# Patient Record
Sex: Male | Born: 1975 | Race: Black or African American | Hispanic: No | Marital: Married | Smoking: Former smoker
Health system: Southern US, Community
[De-identification: ages and names within clinical notes are randomized; demographics above are authoritative.]

## PROBLEM LIST (undated history)

## (undated) DIAGNOSIS — G4730 Sleep apnea, unspecified: Secondary | ICD-10-CM

## (undated) DIAGNOSIS — I2510 Atherosclerotic heart disease of native coronary artery without angina pectoris: Secondary | ICD-10-CM

## (undated) DIAGNOSIS — E785 Hyperlipidemia, unspecified: Secondary | ICD-10-CM

## (undated) DIAGNOSIS — N189 Chronic kidney disease, unspecified: Secondary | ICD-10-CM

## (undated) DIAGNOSIS — I509 Heart failure, unspecified: Secondary | ICD-10-CM

## (undated) DIAGNOSIS — I10 Essential (primary) hypertension: Secondary | ICD-10-CM

## (undated) DIAGNOSIS — R011 Cardiac murmur, unspecified: Secondary | ICD-10-CM

## (undated) HISTORY — PX: EYE SURGERY: SHX253

## (undated) HISTORY — PX: CORONARY ARTERY BYPASS GRAFT: SHX141

## (undated) HISTORY — DX: Hyperlipidemia, unspecified: E78.5

## (undated) HISTORY — DX: Atherosclerotic heart disease of native coronary artery without angina pectoris: I25.10

## (undated) HISTORY — DX: Essential (primary) hypertension: I10

---

## 2000-10-04 ENCOUNTER — Emergency Department (HOSPITAL_COMMUNITY): Admission: EM | Admit: 2000-10-04 | Discharge: 2000-10-04 | Payer: Self-pay | Admitting: *Deleted

## 2008-04-14 HISTORY — PX: CORONARY ANGIOPLASTY: SHX604

## 2008-10-27 ENCOUNTER — Encounter (INDEPENDENT_AMBULATORY_CARE_PROVIDER_SITE_OTHER): Payer: Self-pay | Admitting: Internal Medicine

## 2008-10-27 ENCOUNTER — Observation Stay (HOSPITAL_COMMUNITY): Admission: EM | Admit: 2008-10-27 | Discharge: 2008-10-29 | Payer: Self-pay | Admitting: Emergency Medicine

## 2008-12-15 ENCOUNTER — Inpatient Hospital Stay (HOSPITAL_COMMUNITY): Admission: RE | Admit: 2008-12-15 | Discharge: 2008-12-23 | Payer: Self-pay | Admitting: Cardiology

## 2008-12-15 ENCOUNTER — Encounter (INDEPENDENT_AMBULATORY_CARE_PROVIDER_SITE_OTHER): Payer: Self-pay | Admitting: Cardiology

## 2008-12-15 ENCOUNTER — Ambulatory Visit: Payer: Self-pay | Admitting: Cardiothoracic Surgery

## 2008-12-16 ENCOUNTER — Encounter: Payer: Self-pay | Admitting: Cardiothoracic Surgery

## 2009-01-12 ENCOUNTER — Ambulatory Visit: Payer: Self-pay | Admitting: Cardiothoracic Surgery

## 2009-01-12 ENCOUNTER — Encounter: Admission: RE | Admit: 2009-01-12 | Discharge: 2009-01-12 | Payer: Self-pay | Admitting: Cardiothoracic Surgery

## 2009-01-18 ENCOUNTER — Encounter (HOSPITAL_COMMUNITY): Admission: RE | Admit: 2009-01-18 | Discharge: 2009-04-13 | Payer: Self-pay | Admitting: Cardiology

## 2009-04-14 ENCOUNTER — Encounter (HOSPITAL_COMMUNITY): Admission: RE | Admit: 2009-04-14 | Discharge: 2009-05-18 | Payer: Self-pay | Admitting: Psychiatry

## 2009-09-22 IMAGING — CR DG CHEST 2V
2 series · 2 of 2 positions shown · non-contrast
Comparison: 10/27/2008

CLINICAL DATA: Pre CABG

CHEST - 2 VIEW

[w chest pa]
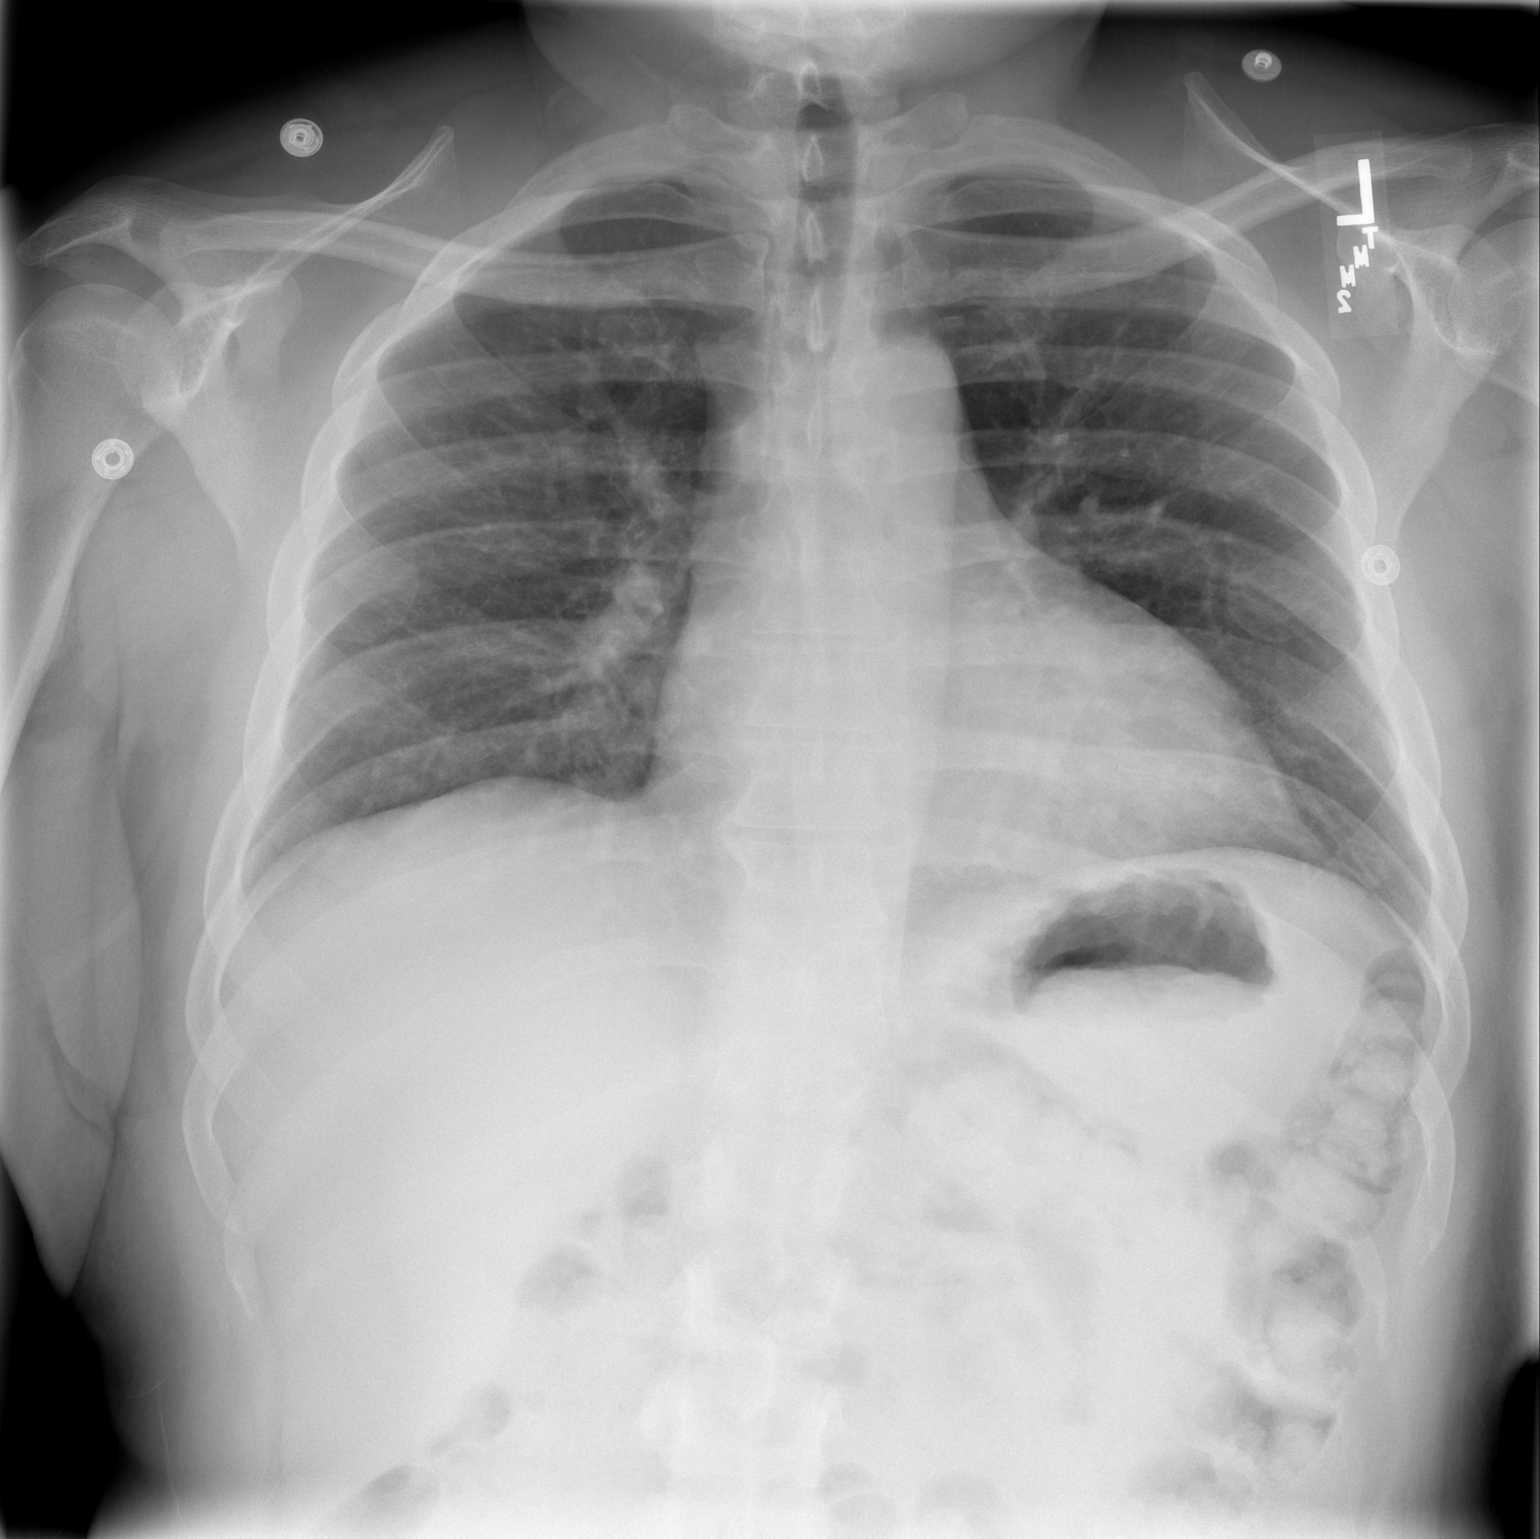

[w chest lat]
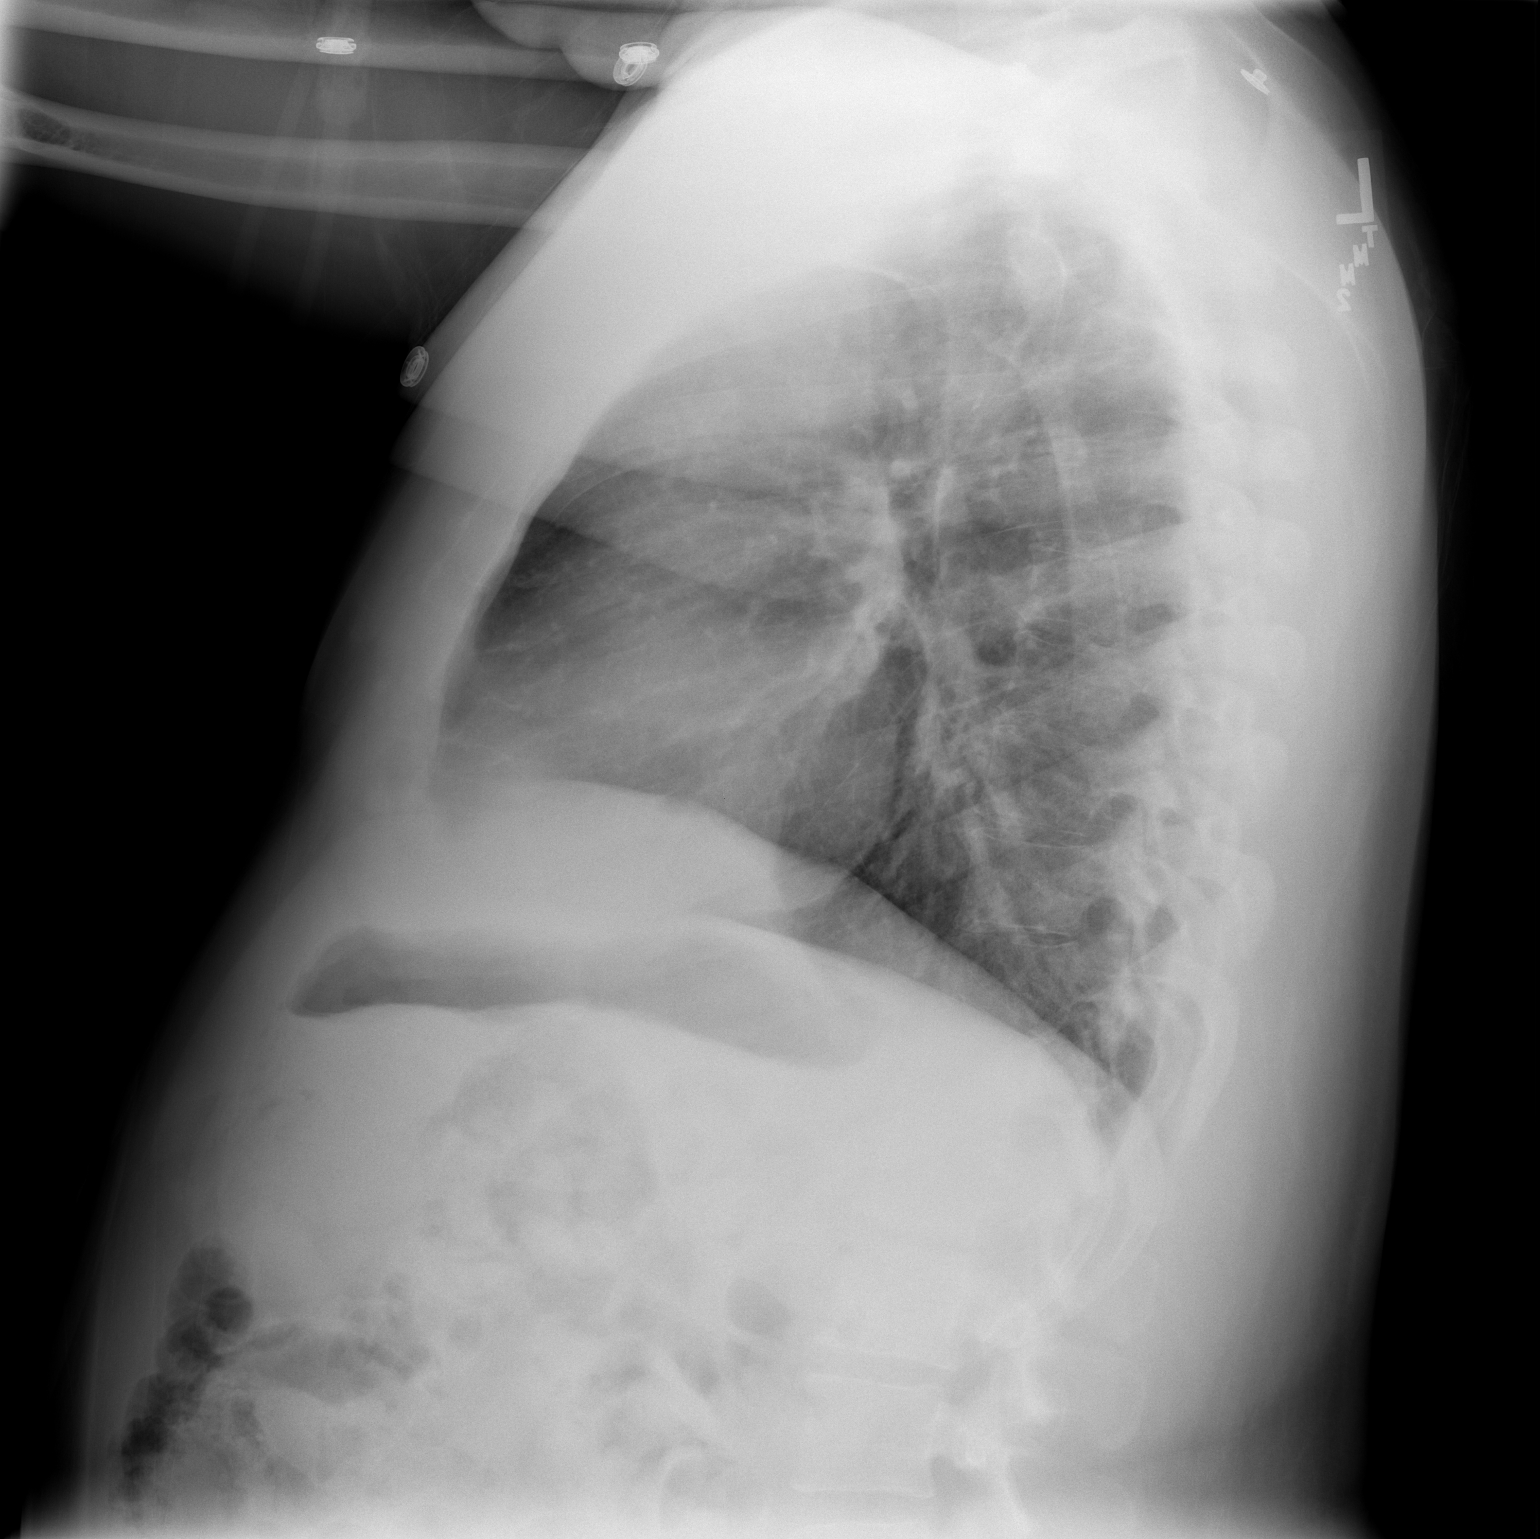

[2 of 2 positions shown; findings below may reference images not displayed]

FINDINGS: Cardiomediastinal silhouette is stable.  Borderline
cardiomegaly again noted.  No acute infiltrate or pleural effusion.
No pulmonary edema.  Bony thorax is unremarkable.
IMPRESSION: Borderline cardiomegaly.  No active disease.

## 2010-07-16 LAB — GLUCOSE, CAPILLARY: Glucose-Capillary: 166 mg/dL — ABNORMAL HIGH (ref 70–99)

## 2010-07-17 LAB — GLUCOSE, CAPILLARY: Glucose-Capillary: 181 mg/dL — ABNORMAL HIGH (ref 70–99)

## 2010-07-18 LAB — GLUCOSE, CAPILLARY
Glucose-Capillary: 98 mg/dL (ref 70–99)
Glucose-Capillary: 120 mg/dL — ABNORMAL HIGH (ref 70–99)
Glucose-Capillary: 138 mg/dL — ABNORMAL HIGH (ref 70–99)
Glucose-Capillary: 145 mg/dL — ABNORMAL HIGH (ref 70–99)
Glucose-Capillary: 169 mg/dL — ABNORMAL HIGH (ref 70–99)

## 2010-07-19 LAB — CBC
HCT: 24.4 % — ABNORMAL LOW (ref 39.0–52.0)
HCT: 25.7 % — ABNORMAL LOW (ref 39.0–52.0)
HCT: 27.1 % — ABNORMAL LOW (ref 39.0–52.0)
HCT: 27.4 % — ABNORMAL LOW (ref 39.0–52.0)
HCT: 29 % — ABNORMAL LOW (ref 39.0–52.0)
HCT: 29.5 % — ABNORMAL LOW (ref 39.0–52.0)
HCT: 31 % — ABNORMAL LOW (ref 39.0–52.0)
HCT: 31.1 % — ABNORMAL LOW (ref 39.0–52.0)
HCT: 39.3 % (ref 39.0–52.0)
HCT: 40.8 % (ref 39.0–52.0)
HCT: 41.9 % (ref 39.0–52.0)
Hemoglobin: 10 g/dL — ABNORMAL LOW (ref 13.0–17.0)
Hemoglobin: 10.3 g/dL — ABNORMAL LOW (ref 13.0–17.0)
Hemoglobin: 10.3 g/dL — ABNORMAL LOW (ref 13.0–17.0)
Hemoglobin: 13.6 g/dL (ref 13.0–17.0)
Hemoglobin: 14 g/dL (ref 13.0–17.0)
Hemoglobin: 8.1 g/dL — ABNORMAL LOW (ref 13.0–17.0)
Hemoglobin: 8.5 g/dL — ABNORMAL LOW (ref 13.0–17.0)
Hemoglobin: 9 g/dL — ABNORMAL LOW (ref 13.0–17.0)
Hemoglobin: 9.3 g/dL — ABNORMAL LOW (ref 13.0–17.0)
Hemoglobin: 9.7 g/dL — ABNORMAL LOW (ref 13.0–17.0)
MCHC: 33.1 g/dL (ref 30.0–36.0)
MCHC: 33.2 g/dL (ref 30.0–36.0)
MCHC: 33.2 g/dL (ref 30.0–36.0)
MCHC: 33.3 g/dL (ref 30.0–36.0)
MCHC: 33.3 g/dL (ref 30.0–36.0)
MCHC: 33.4 g/dL (ref 30.0–36.0)
MCHC: 33.4 g/dL (ref 30.0–36.0)
MCHC: 33.4 g/dL (ref 30.0–36.0)
MCHC: 33.4 g/dL (ref 30.0–36.0)
MCHC: 33.7 g/dL (ref 30.0–36.0)
MCHC: 34 g/dL (ref 30.0–36.0)
MCV: 85.1 fL (ref 78.0–100.0)
MCV: 85.2 fL (ref 78.0–100.0)
MCV: 85.2 fL (ref 78.0–100.0)
MCV: 85.3 fL (ref 78.0–100.0)
MCV: 85.3 fL (ref 78.0–100.0)
MCV: 85.5 fL (ref 78.0–100.0)
MCV: 85.7 fL (ref 78.0–100.0)
MCV: 85.7 fL (ref 78.0–100.0)
MCV: 85.8 fL (ref 78.0–100.0)
MCV: 86.2 fL (ref 78.0–100.0)
MCV: 86.4 fL (ref 78.0–100.0)
Platelets: 150 10*3/uL (ref 150–400)
Platelets: 172 10*3/uL (ref 150–400)
Platelets: 188 10*3/uL (ref 150–400)
Platelets: 196 10*3/uL (ref 150–400)
Platelets: 196 10*3/uL (ref 150–400)
Platelets: 220 10*3/uL (ref 150–400)
Platelets: 241 10*3/uL (ref 150–400)
Platelets: 242 10*3/uL (ref 150–400)
Platelets: 252 10*3/uL (ref 150–400)
Platelets: 294 10*3/uL (ref 150–400)
RBC: 2.87 MIL/uL — ABNORMAL LOW (ref 4.22–5.81)
RBC: 3.02 MIL/uL — ABNORMAL LOW (ref 4.22–5.81)
RBC: 3.14 MIL/uL — ABNORMAL LOW (ref 4.22–5.81)
RBC: 3.2 MIL/uL — ABNORMAL LOW (ref 4.22–5.81)
RBC: 3.37 MIL/uL — ABNORMAL LOW (ref 4.22–5.81)
RBC: 3.45 MIL/uL — ABNORMAL LOW (ref 4.22–5.81)
RBC: 3.63 MIL/uL — ABNORMAL LOW (ref 4.22–5.81)
RBC: 3.64 MIL/uL — ABNORMAL LOW (ref 4.22–5.81)
RBC: 4.76 MIL/uL (ref 4.22–5.81)
RBC: 4.91 MIL/uL (ref 4.22–5.81)
RDW: 13.3 % (ref 11.5–15.5)
RDW: 13.5 % (ref 11.5–15.5)
RDW: 13.5 % (ref 11.5–15.5)
RDW: 13.8 % (ref 11.5–15.5)
RDW: 13.8 % (ref 11.5–15.5)
RDW: 13.8 % (ref 11.5–15.5)
RDW: 14 % (ref 11.5–15.5)
RDW: 14.1 % (ref 11.5–15.5)
RDW: 14.2 % (ref 11.5–15.5)
WBC: 10.4 10*3/uL (ref 4.0–10.5)
WBC: 12.2 10*3/uL — ABNORMAL HIGH (ref 4.0–10.5)
WBC: 13.1 10*3/uL — ABNORMAL HIGH (ref 4.0–10.5)
WBC: 14 10*3/uL — ABNORMAL HIGH (ref 4.0–10.5)
WBC: 15.7 10*3/uL — ABNORMAL HIGH (ref 4.0–10.5)
WBC: 15.7 10*3/uL — ABNORMAL HIGH (ref 4.0–10.5)
WBC: 15.8 10*3/uL — ABNORMAL HIGH (ref 4.0–10.5)
WBC: 16.1 10*3/uL — ABNORMAL HIGH (ref 4.0–10.5)
WBC: 8.5 10*3/uL (ref 4.0–10.5)
WBC: 9.5 10*3/uL (ref 4.0–10.5)

## 2010-07-19 LAB — BASIC METABOLIC PANEL
BUN: 7 mg/dL (ref 6–23)
BUN: 7 mg/dL (ref 6–23)
BUN: 7 mg/dL (ref 6–23)
BUN: 7 mg/dL (ref 6–23)
CO2: 25 mEq/L (ref 19–32)
CO2: 26 mEq/L (ref 19–32)
CO2: 27 mEq/L (ref 19–32)
CO2: 27 mEq/L (ref 19–32)
Calcium: 8.6 mg/dL (ref 8.4–10.5)
Calcium: 8.6 mg/dL (ref 8.4–10.5)
Calcium: 8.8 mg/dL (ref 8.4–10.5)
Calcium: 9 mg/dL (ref 8.4–10.5)
Chloride: 101 mEq/L (ref 96–112)
Chloride: 105 mEq/L (ref 96–112)
Chloride: 105 mEq/L (ref 96–112)
Chloride: 113 mEq/L — ABNORMAL HIGH (ref 96–112)
Creatinine, Ser: 0.74 mg/dL (ref 0.4–1.5)
Creatinine, Ser: 0.81 mg/dL (ref 0.4–1.5)
Creatinine, Ser: 0.82 mg/dL (ref 0.4–1.5)
Creatinine, Ser: 0.85 mg/dL (ref 0.4–1.5)
GFR calc Af Amer: >60 mL/min (ref >60)
GFR calc Af Amer: >60 mL/min (ref >60)
GFR calc Af Amer: >60 mL/min (ref >60)
GFR calc Af Amer: >60 mL/min (ref >60)
GFR calc non Af Amer: >60 mL/min (ref >60)
GFR calc non Af Amer: >60 mL/min (ref >60)
GFR calc non Af Amer: >60 mL/min (ref >60)
GFR calc non Af Amer: >60 mL/min (ref >60)
Glucose, Bld: 135 mg/dL — ABNORMAL HIGH (ref 70–99)
Glucose, Bld: 137 mg/dL — ABNORMAL HIGH (ref 70–99)
Glucose, Bld: 173 mg/dL — ABNORMAL HIGH (ref 70–99)
Glucose, Bld: 88 mg/dL (ref 70–99)
Potassium: 3.9 mEq/L (ref 3.5–5.1)
Potassium: 3.9 mEq/L (ref 3.5–5.1)
Potassium: 3.9 mEq/L (ref 3.5–5.1)
Potassium: 4 mEq/L (ref 3.5–5.1)
Sodium: 137 mEq/L (ref 135–145)
Sodium: 137 mEq/L (ref 135–145)
Sodium: 138 mEq/L (ref 135–145)
Sodium: 144 mEq/L (ref 135–145)

## 2010-07-19 LAB — URINALYSIS, MICROSCOPIC ONLY
Bilirubin Urine: NEGATIVE
Glucose, UA: NEGATIVE mg/dL
Ketones, ur: NEGATIVE mg/dL
Nitrite: NEGATIVE
Nitrite: NEGATIVE
Protein, ur: NEGATIVE mg/dL
Specific Gravity, Urine: 1.011 (ref 1.005–1.030)
Specific Gravity, Urine: 1.017 (ref 1.005–1.030)
Urobilinogen, UA: 0.2 mg/dL (ref 0.0–1.0)
Urobilinogen, UA: 1 mg/dL (ref 0.0–1.0)
pH: 5.5 (ref 5.0–8.0)

## 2010-07-19 LAB — POCT I-STAT 3, ART BLOOD GAS (G3+)
Bicarbonate: 24.2 mEq/L — ABNORMAL HIGH (ref 20.0–24.0)
Patient temperature: 36.2
TCO2: 26 mmol/L (ref 0–100)
TCO2: 26 mmol/L (ref 0–100)
pCO2 arterial: 37.6 mmHg (ref 35.0–45.0)
pCO2 arterial: 39 mmHg (ref 35.0–45.0)
pCO2 arterial: 41.3 mmHg (ref 35.0–45.0)
pCO2 arterial: 46.1 mmHg — ABNORMAL HIGH (ref 35.0–45.0)
pH, Arterial: 7.346 — ABNORMAL LOW (ref 7.350–7.450)
pH, Arterial: 7.393 (ref 7.350–7.450)
pH, Arterial: 7.396 (ref 7.350–7.450)
pH, Arterial: 7.42 (ref 7.350–7.450)
pH, Arterial: 7.421 (ref 7.350–7.450)
pO2, Arterial: 116 mmHg — ABNORMAL HIGH (ref 80.0–100.0)
pO2, Arterial: 289 mmHg — ABNORMAL HIGH (ref 80.0–100.0)
pO2, Arterial: 89 mmHg (ref 80.0–100.0)

## 2010-07-19 LAB — URINE CULTURE
Colony Count: NO GROWTH
Colony Count: NO GROWTH
Culture: NO GROWTH
Culture: NO GROWTH

## 2010-07-19 LAB — HEPARIN LEVEL (UNFRACTIONATED)
Heparin Unfractionated: 0.21 IU/mL — ABNORMAL LOW (ref 0.30–0.70)
Heparin Unfractionated: 0.24 IU/mL — ABNORMAL LOW (ref 0.30–0.70)
Heparin Unfractionated: 0.4 IU/mL (ref 0.30–0.70)
Heparin Unfractionated: 0.69 IU/mL (ref 0.30–0.70)

## 2010-07-19 LAB — GLUCOSE, CAPILLARY
Glucose-Capillary: 107 mg/dL — ABNORMAL HIGH (ref 70–99)
Glucose-Capillary: 116 mg/dL — ABNORMAL HIGH (ref 70–99)
Glucose-Capillary: 121 mg/dL — ABNORMAL HIGH (ref 70–99)
Glucose-Capillary: 122 mg/dL — ABNORMAL HIGH (ref 70–99)
Glucose-Capillary: 133 mg/dL — ABNORMAL HIGH (ref 70–99)
Glucose-Capillary: 136 mg/dL — ABNORMAL HIGH (ref 70–99)
Glucose-Capillary: 141 mg/dL — ABNORMAL HIGH (ref 70–99)
Glucose-Capillary: 148 mg/dL — ABNORMAL HIGH (ref 70–99)
Glucose-Capillary: 153 mg/dL — ABNORMAL HIGH (ref 70–99)
Glucose-Capillary: 155 mg/dL — ABNORMAL HIGH (ref 70–99)
Glucose-Capillary: 163 mg/dL — ABNORMAL HIGH (ref 70–99)
Glucose-Capillary: 180 mg/dL — ABNORMAL HIGH (ref 70–99)
Glucose-Capillary: 186 mg/dL — ABNORMAL HIGH (ref 70–99)
Glucose-Capillary: 59 mg/dL — ABNORMAL LOW (ref 70–99)
Glucose-Capillary: 78 mg/dL (ref 70–99)
Glucose-Capillary: 93 mg/dL (ref 70–99)

## 2010-07-19 LAB — URINALYSIS, ROUTINE W REFLEX MICROSCOPIC
Bilirubin Urine: NEGATIVE
Glucose, UA: NEGATIVE mg/dL
Hgb urine dipstick: NEGATIVE
Ketones, ur: NEGATIVE mg/dL
Nitrite: NEGATIVE
Protein, ur: NEGATIVE mg/dL
Specific Gravity, Urine: 1.004 — ABNORMAL LOW (ref 1.005–1.030)
Urobilinogen, UA: 0.2 mg/dL (ref 0.0–1.0)
pH: 6.5 (ref 5.0–8.0)

## 2010-07-19 LAB — COMPREHENSIVE METABOLIC PANEL
ALT: 20 U/L (ref 0–53)
ALT: 21 U/L (ref 0–53)
AST: 18 U/L (ref 0–37)
AST: 19 U/L (ref 0–37)
Albumin: 3.3 g/dL — ABNORMAL LOW (ref 3.5–5.2)
Albumin: 3.7 g/dL (ref 3.5–5.2)
Alkaline Phosphatase: 51 U/L (ref 39–117)
Alkaline Phosphatase: 54 U/L (ref 39–117)
BUN: 7 mg/dL (ref 6–23)
BUN: 8 mg/dL (ref 6–23)
CO2: 25 mEq/L (ref 19–32)
CO2: 29 mEq/L (ref 19–32)
Calcium: 8.9 mg/dL (ref 8.4–10.5)
Calcium: 9.5 mg/dL (ref 8.4–10.5)
Chloride: 106 mEq/L (ref 96–112)
Chloride: 107 mEq/L (ref 96–112)
Creatinine, Ser: 0.79 mg/dL (ref 0.4–1.5)
Creatinine, Ser: 0.88 mg/dL (ref 0.4–1.5)
GFR calc Af Amer: >60 mL/min (ref >60)
GFR calc Af Amer: >60 mL/min (ref >60)
GFR calc non Af Amer: >60 mL/min (ref >60)
GFR calc non Af Amer: >60 mL/min (ref >60)
Glucose, Bld: 152 mg/dL — ABNORMAL HIGH (ref 70–99)
Glucose, Bld: 265 mg/dL — ABNORMAL HIGH (ref 70–99)
Potassium: 3.7 mEq/L (ref 3.5–5.1)
Potassium: 3.8 mEq/L (ref 3.5–5.1)
Sodium: 140 mEq/L (ref 135–145)
Sodium: 142 mEq/L (ref 135–145)
Total Bilirubin: 0.7 mg/dL (ref 0.3–1.2)
Total Bilirubin: 0.9 mg/dL (ref 0.3–1.2)
Total Protein: 6.4 g/dL (ref 6.0–8.3)
Total Protein: 7.2 g/dL (ref 6.0–8.3)

## 2010-07-19 LAB — ABO/RH: ABO/RH(D): A POS

## 2010-07-19 LAB — POCT I-STAT 3, VENOUS BLOOD GAS (G3P V)
Acid-Base Excess: 3 mmol/L — ABNORMAL HIGH (ref 0.0–2.0)
O2 Saturation: 78 %
TCO2: 30 mmol/L (ref 0–100)
pCO2, Ven: 44.4 mmHg — ABNORMAL LOW (ref 45.0–50.0)
pO2, Ven: 42 mmHg (ref 30.0–45.0)

## 2010-07-19 LAB — LIPID PANEL
Cholesterol: 150 mg/dL (ref 0–200)
Cholesterol: 155 mg/dL (ref 0–200)
HDL: 29 mg/dL — ABNORMAL LOW (ref >39)
HDL: 34 mg/dL — ABNORMAL LOW (ref >39)
LDL Cholesterol: 71 mg/dL (ref 0–99)
LDL Cholesterol: 83 mg/dL (ref 0–99)
Total CHOL/HDL Ratio: 4.6 RATIO
Total CHOL/HDL Ratio: 5.2 RATIO
Triglycerides: 192 mg/dL — ABNORMAL HIGH (ref <150)
Triglycerides: 249 mg/dL — ABNORMAL HIGH (ref <150)
VLDL: 38 mg/dL (ref 0–40)
VLDL: 50 mg/dL — ABNORMAL HIGH (ref 0–40)

## 2010-07-19 LAB — HEMOGLOBIN A1C
Hgb A1c MFr Bld: 9.9 % — ABNORMAL HIGH (ref 4.6–6.1)
Mean Plasma Glucose: 237 mg/dL

## 2010-07-19 LAB — PROTIME-INR
INR: 1 (ref 0.00–1.49)
INR: 1.4 (ref 0.00–1.49)
Prothrombin Time: 12.9 seconds (ref 11.6–15.2)
Prothrombin Time: 17 seconds — ABNORMAL HIGH (ref 11.6–15.2)

## 2010-07-19 LAB — POCT I-STAT 4, (NA,K, GLUC, HGB,HCT)
Glucose, Bld: 104 mg/dL — ABNORMAL HIGH (ref 70–99)
Glucose, Bld: 127 mg/dL — ABNORMAL HIGH (ref 70–99)
Glucose, Bld: 92 mg/dL (ref 70–99)
HCT: 26 % — ABNORMAL LOW (ref 39.0–52.0)
HCT: 26 % — ABNORMAL LOW (ref 39.0–52.0)
HCT: 28 % — ABNORMAL LOW (ref 39.0–52.0)
HCT: 35 % — ABNORMAL LOW (ref 39.0–52.0)
HCT: 35 % — ABNORMAL LOW (ref 39.0–52.0)
Hemoglobin: 13.3 g/dL (ref 13.0–17.0)
Hemoglobin: 8.8 g/dL — ABNORMAL LOW (ref 13.0–17.0)
Potassium: 3.6 mEq/L (ref 3.5–5.1)
Potassium: 3.7 mEq/L (ref 3.5–5.1)
Potassium: 3.8 mEq/L (ref 3.5–5.1)
Sodium: 138 mEq/L (ref 135–145)
Sodium: 141 mEq/L (ref 135–145)
Sodium: 145 mEq/L (ref 135–145)

## 2010-07-19 LAB — POCT I-STAT, CHEM 8
Chloride: 102 mEq/L (ref 96–112)
HCT: 29 % — ABNORMAL LOW (ref 39.0–52.0)
HCT: 31 % — ABNORMAL LOW (ref 39.0–52.0)
Hemoglobin: 10.5 g/dL — ABNORMAL LOW (ref 13.0–17.0)
Hemoglobin: 9.9 g/dL — ABNORMAL LOW (ref 13.0–17.0)
Potassium: 3.7 mEq/L (ref 3.5–5.1)
Potassium: 4.4 mEq/L (ref 3.5–5.1)
Sodium: 137 mEq/L (ref 135–145)
Sodium: 142 mEq/L (ref 135–145)
TCO2: 24 mmol/L (ref 0–100)

## 2010-07-19 LAB — BLOOD GAS, ARTERIAL
Acid-Base Excess: 2.7 mmol/L — ABNORMAL HIGH (ref 0.0–2.0)
Bicarbonate: 26.7 mEq/L — ABNORMAL HIGH (ref 20.0–24.0)
Drawn by: 30806
FIO2: 0.21 %
O2 Saturation: 95 %
Patient temperature: 98.6
TCO2: 28 mmol/L (ref 0–100)
pCO2 arterial: 41.2 mmHg (ref 35.0–45.0)
pH, Arterial: 7.428 (ref 7.350–7.450)
pO2, Arterial: 72.2 mmHg — ABNORMAL LOW (ref 80.0–100.0)

## 2010-07-19 LAB — CREATININE, SERUM
Creatinine, Ser: 0.74 mg/dL (ref 0.4–1.5)
Creatinine, Ser: 0.75 mg/dL (ref 0.4–1.5)
GFR calc Af Amer: >60 mL/min (ref >60)
GFR calc Af Amer: >60 mL/min (ref >60)
GFR calc non Af Amer: >60 mL/min (ref >60)
GFR calc non Af Amer: >60 mL/min (ref >60)

## 2010-07-19 LAB — CROSSMATCH
ABO/RH(D): A POS
Antibody Screen: NEGATIVE

## 2010-07-19 LAB — MRSA CULTURE

## 2010-07-19 LAB — APTT
aPTT: 134 seconds — ABNORMAL HIGH (ref 24–37)
aPTT: 36 seconds (ref 24–37)

## 2010-07-19 LAB — MAGNESIUM
Magnesium: 2.3 mg/dL (ref 1.5–2.5)
Magnesium: 2.4 mg/dL (ref 1.5–2.5)
Magnesium: 2.7 mg/dL — ABNORMAL HIGH (ref 1.5–2.5)

## 2010-07-19 LAB — PLATELET COUNT: Platelets: 169 10*3/uL (ref 150–400)

## 2010-07-21 LAB — LIPID PANEL
Cholesterol: 278 mg/dL — ABNORMAL HIGH (ref 0–200)
HDL: 40 mg/dL (ref >39)
Triglycerides: 363 mg/dL — ABNORMAL HIGH (ref <150)

## 2010-07-21 LAB — CARDIAC PANEL(CRET KIN+CKTOT+MB+TROPI)
CK, MB: 2.4 ng/mL (ref 0.3–4.0)
CK, MB: 2.5 ng/mL (ref 0.3–4.0)
Relative Index: 0.9 (ref 0.0–2.5)
Relative Index: 1.2 (ref 0.0–2.5)

## 2010-07-21 LAB — DIFFERENTIAL
Eosinophils Relative: 1 % (ref 0–5)
Lymphocytes Relative: 18 % (ref 12–46)
Lymphs Abs: 1.8 10*3/uL (ref 0.7–4.0)

## 2010-07-21 LAB — GLUCOSE, CAPILLARY
Glucose-Capillary: 207 mg/dL — ABNORMAL HIGH (ref 70–99)
Glucose-Capillary: 277 mg/dL — ABNORMAL HIGH (ref 70–99)
Glucose-Capillary: 278 mg/dL — ABNORMAL HIGH (ref 70–99)

## 2010-07-21 LAB — HEMOGLOBIN A1C
Hgb A1c MFr Bld: 13.4 % — ABNORMAL HIGH (ref 4.6–6.1)
Mean Plasma Glucose: 338 mg/dL

## 2010-07-21 LAB — POCT CARDIAC MARKERS
Troponin i, poc: <0.05 ng/mL (ref 0.00–0.09)
Troponin i, poc: <0.05 ng/mL (ref 0.00–0.09)

## 2010-07-21 LAB — BASIC METABOLIC PANEL
BUN: 8 mg/dL (ref 6–23)
GFR calc non Af Amer: >60 mL/min (ref >60)
Potassium: 4 mEq/L (ref 3.5–5.1)
Sodium: 134 mEq/L — ABNORMAL LOW (ref 135–145)

## 2010-07-21 LAB — CBC
HCT: 47.2 % (ref 39.0–52.0)
Hemoglobin: 15.8 g/dL (ref 13.0–17.0)
Platelets: 246 10*3/uL (ref 150–400)
WBC: 10.1 10*3/uL (ref 4.0–10.5)

## 2010-07-21 LAB — RAPID URINE DRUG SCREEN, HOSP PERFORMED
Cocaine: NOT DETECTED
Tetrahydrocannabinol: POSITIVE — AB

## 2010-07-21 LAB — TROPONIN I: Troponin I: 0.04 ng/mL (ref 0.00–0.06)

## 2010-07-21 LAB — TSH: TSH: 1.142 u[IU]/mL (ref 0.350–4.500)

## 2010-08-27 NOTE — H&P (Signed)
NAMEPRATT, REDA NO.:  [id number]   MEDICAL RECORD NO.:  [id number]          PATIENT TYPE:  EMS   LOCATION:  [facility]                         FACILITY:  [facility]   PHYSICIAN:  Rise Patience, MDDATE OF BIRTH:  Sep 24, 1975   DATE OF ADMISSION:  10/27/2008  DATE OF DISCHARGE:                              HISTORY & PHYSICAL   PRIMARY CARE PHYSICIAN:  Unassigned.   CHIEF COMPLAINT:  Chest pain.   HISTORY OF PRESENT ILLNESS:  This is a 35 year old male with a history  of diabetes mellitus type 2, diagnosed two years ago.  He is not on any  medication.  He presently complains of chest pain which started  yesterday by the evening time.  The chest pain is located in the left  anterior chest wall and non-radiating, constant.  It comes off and on  and is not related to exertion.  He denies any associated shortness of  breath, nausea, diaphoresis, dizziness or any loss of  consciousness.   In the emergency room had a chest x-ray and a cardiac exam and  electrocardiogram, all of which did not show anything acute.  The  patient; has been admitted for further evaluation and management.  The  patient denies any focal deficit, headache, abdominal pain, dysuria or  discharges.  No nausea, vomiting, fevers or chills.   PAST MEDICAL HISTORY:  Diabetes mellitus, type 2, not taking any  medications.   PAST SURGICAL HISTORY:  None.   MEDICATIONS ON ADMISSION:  None.   ALLERGIES:  NO KNOWN DRUG ALLERGIES.   FAMILY HISTORY:  Significant for his father having a myocardial  infarction at age 77.   SOCIAL HISTORY:  The patient smokes cigarettes.  Has been advised to  quit smoking.  Drinks alcohol on Tuesdays and Thursdays, when he plays  pool.  Denies any drug abuse now, but used to smoke marijuana.   REVIEW OF SYSTEMS:  As in the history of present illness, otherwise  nothing significant.   PHYSICAL EXAMINATION:  GENERAL:  The patient is examined at bedside.  He  is in no  acute distress.  VITAL SIGNS:  Blood pressure 117/98, pulse 96 per minute, temperature  99.7 degrees, respirations 18 per minute.  O2 saturation 100% on room  air.  HEENT:  Anicteric.  No pallor.  CHEST:  Bilateral air entry present.  No rhonchi, no crepitation.  HEART:  S1 and S2 heard.  ABDOMEN:  Soft, nontender.  Bowel sounds heard.  CNS/EXTREMITIES:  Awake, alert and oriented to time, place and person.  Moves all upper and lower extremities, with 5/5.  Good peripheral  pulses.  No edema.   LABORATORY DATA:  A chest x-ray with borderline cardiomegaly without  acute disease.   Electrocardiogram:  Normal sinus rhythm with no acute ST changes.   CBC:  WBC 10.1, hemoglobin 15.8, hematocrit 47.2, platelets 246,  neutrophils 78%.  Basic metabolic panel:  Sodium Q000111Q, potassium 4,  chloride 100, bicarbonate 24, glucose 315, BUN 8, creatinine 0.7,  calcium 9.5.  CK-MB 2.1, troponin I less than 0.5.   ASSESSMENT:  1. Chest pain,  rule out acute coronary syndrome.  2. Diabetes mellitus, type 2, noncompliant.  3. Hypertension, newly diagnosed.  4. Noncompliant.  5. Obesity.  6. History of drug abuse.  7. Ongoing tobacco abuse.   PLAN:  1. Will admit the patient to telemetry.  2. Will cycle cardiac markers and place the patient on aspirin.  3. Place the patient on GI prophylaxis and deep venous thrombosis      prophylaxis.  4. Will check a lipid panel, hemoglobin A1c.  Will start the patient      on Lisinopril for now.  Check a drug screen.  5. Will need a cardiology consultation plus a stress test.      Rise Patience, MD  Electronically Signed     ANK/MEDQ  D:  10/27/2008  T:  10/27/2008  Job:  [id number]

## 2010-08-27 NOTE — Assessment & Plan Note (Signed)
OFFICE VISIT   ELDON, LATTER  DOB:  1976/01/04                                        January 12, 2009  CHART #:  [id number]   CURRENT PROBLEMS:  1. Status post coronary artery bypass graft x3 December 18, 2008 for      severe three-vessel coronary disease with unstable angina.  2. Diabetes mellitus, poorly controlled.  3. History of smoking, now reformed.  4. Hypertension.   PRESENT ILLNESS:  The patient returns for his first postop office visit  after undergoing multivessel bypass grafting for severe three-vessel  coronary disease and unstable angina.  He was admitted to the hospital  with unstable angina and Dr. Francine Graven cath demonstrated an EF of 45%  with an ulcerated plaque in the LAD as well as occlusive disease of the  circumflex and right coronaries.  He underwent left IMA graft to the LAD  and vein grafts to the OM1 and posterior descending.  The LAD was  intramyocardial.  He did well after surgery and his blood sugars were  better controlled on his postoperative regimen of Amaryl plus sliding  scale insulin.  Since returning home, he has done well without recurrent  angina and the incisions are healed well with exception of an eschar at  the superior aspect of his sternal suture granuloma.   He remains on his discharge medications including Plavix, aspirin,  lisinopril, Lopressor, Amaryl, metoprolol, and fish oil.   PHYSICAL EXAMINATION:  Vital Signs:  Blood pressure 120/80, pulse 70,  respirations 18, and saturation 100%.  General:  He appears very well  today.  Lungs:  His breath sounds are clear and equal.  The sternum is  stable and healing well.  The eschar at the superior aspect is cleaned  and removed.  There is a small area of subcutaneous ulceration  granulation tissue without deep sinus tract and this was cleaned with  saline and a Neosporin Band-Aid dressing was applied.  Some of the skin  and suture material was removed.   Extremities:  The leg incision is  healed well and there is no significant peripheral edema.   PA and lateral chest x-ray shows clear lung fields with a stable cardiac  shadow and the sternal wires are intact.   PLAN:  The patient will resume driving and light activities, knows to  avoid lifting more than 20 pounds until March 14, 2009.  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  No prescriptions were provided and  he was counseled on discontinuing the following medications when the  current prescription runs out:  Plavix, iron, Lasix, potassium, and  oxycodone.  He returned to the care of his primary care physician, Dr.  Horald Pollen, and Dr. Elisabeth Cara at [facility] and [place].   Ivin Poot, M.D.  Electronically Signed   PV/MEDQ  D:  01/12/2009  T:  01/13/2009  Job:  [id number]

## 2010-08-27 NOTE — Discharge Summary (Signed)
NAMESARP, FEEHAN               ACCOUNT NO.:  [id number]   MEDICAL RECORD NO.:  [id number]          PATIENT TYPE:  INP   LOCATION:  [id number]                         FACILITY:  [facility]   PHYSICIAN:  Kieth Brightly, MDDATE OF BIRTH:  Sep 17, 1975   DATE OF ADMISSION:  10/27/2008  DATE OF DISCHARGE:  10/29/2008                               DISCHARGE SUMMARY   ADDENDUM   The patient was fine for discharge yesterday when his blood pressure was  extremely high in the range of 123XX123 systolic.  The patient was  started already on lisinopril and Coreg.  The patient was given one dose  of hydralazine for the blood pressure to be brought down and was  observed until late evening when the blood pressure was still high, so  he was given the medications in the evening for the blood pressure and  so I spoke to the patient and asked him to stay overnight to make sure  his blood pressure is stable.   The patient was seen today morning and blood pressures have been stable  over the night and is well controlled at this time.  His blood pressure  is 139/98 at this time.  The patient was seen by Cardiology again and  they increased Coreg from 6.25 to 12.5 b.i.d.  The patient is currently  stable, no symptoms, so he is being discharged.   DISCHARGE DIAGNOSIS:  Remains the same.   DISCHARGE MEDICATIONS:  1. Aspirin 81 mg p.o. daily.  2. Amaryl 2 mg p.o. daily in a.m.  3. Lisinopril 10 mg p.o. daily in a.m.  4. Coreg 12.5 mg p.o. b.i.d.  5. Metformin 500 mg p.o. b.i.d.  6. Lipitor 20 mg p.o. at bedtime.  7. Nitroglycerin 0.4 mg under the tongue for chest pain as needed up      to 3 doses of maximum 20 minutes.  8. Fish oil capsule, 2 capsule by mouth daily.  9. NovoLog insulin sliding scale q.a.c. and at bedtime, follow the      sensitive scale at [facility].   DISPOSITION:  Discharged back home.   A total of 40 minutes was spent on discharge today.      Kieth Brightly,  MD  Electronically Signed     UT/MEDQ  D:  10/29/2008  T:  10/29/2008  Job:  [id number]   cc:   Tery Sanfilippo, MD  Jacelyn Pi, M.D.  Maebelle Munroe. Darron Doom, M.D.

## 2010-08-27 NOTE — Discharge Summary (Signed)
[name], [name]               ACCOUNT NO.:  [id number]   MEDICAL RECORD NO.:  [id number]          PATIENT TYPE:  INP   LOCATION:  [id number]                         FACILITY:  [place]   PHYSICIAN:  Kieth Brightly, MDDATE OF BIRTH:  06-02-1975   DATE OF ADMISSION:  10/27/2008  DATE OF DISCHARGE:                               DISCHARGE SUMMARY   PRIMARY CARE PHYSICIAN:  1. Expected to see Dr. Horald Pollen, phone number [phone].  2. Diabetology - Dr. Jacelyn Pi, phone number  [phone].  3. Cardiology - Dr. Tery Sanfilippo, [facility] and Vascular,      phone number [phone].   REASON FOR ADMISSION:  Chest pain.   DISCHARGE DIAGNOSES:  1. Chest pain - atypical - acute myocardial infarction ruled out.  2. Diabetes mellitus type 2, not on any medications so far - newly      started on medications.  3. Hypertension, newly diagnosed, started on medications.  4. No complaints with medications and health checkups so far.  5. Obesity.  6. Dyslipidemia - high LDL and high triglycerides.  7. Tobacco abuse.  8. History of drug abuse - urine toxicology positive for THC.   DISCHARGE MEDICATIONS:  1. Aspirin 81 mg p.o. daily.  2. Amaryl 2 mg p.o. daily in a.m.  3. Lisinopril 10 mg p.o. daily.  4. Coreg 6.25 mg p.o. b.i.d..  5. Metformin 500 mg p.o. b.i.d..  6. Lipitor 20 mg p.o. daily at bedtime.  7. Nitroglycerin 0.4 mg under the tongue for chest pain as needed up      to 3 doses max in 20 minutes.  8. Fish oil caps 2 caps by mouth daily.  9. NovoLog insulin sliding scale q.a.c. and q.h.s. , follow the      sensitive scale of [facility].   HOSPITAL COURSE:  1. Chest pain.  The patient was admitted yesterday for complaints of      retrosternal chest pain.  The chest pain was present while he was      sitting in a chair and not under any exertion.  The chest pain was      on the left side of the chest wall mainly and on retrosternal area.      It was  nonradiating and constant.  It was waning and waxing and      coming on and off.  No associated dyspnea, shortness of breath,      nausea or vomiting present.  In view of the fact that the patient      had been previously diagnosed as diabetic and was not following his      medications and his hemoglobin A1c was 13.4 and was also diagnosed      with hypertension at the time of admission, risk factor      stratification revealed a very high risk for this patient even      though his age is  lower; i.e., 39.  His lipid profile showed      extremely high triglycerides levels and high LDL levels and he has  ongoing history of smoking and he is obese with BMI going more than      32.  The patient had a consult with Cardiology - [place]      and Vascular.  He was seen by Dr. Tery Sanfilippo of [facility] and Vascular who has evaluated the patient.  The patient's      cardiac enzymes were cycled which revealed no evidence of acute      myocardial infarction now.  EKG did not reveal any significant      changes.  He was started on medications for hypertension and      diabetes as well as started on aspirin and Lipitor for his      cholesterol.  The patient currently has coverage and he says that      he has a stable job and he was previously not taking keeping up      with his medications and appointments because  he did not have      insurance.  Currently the patient has been discharged with      instructions by Cardiology to followup for outpatient  exercise      Myoview.  Dr. Francine Graven office will call the patient to set up      [place] appointment.  The patient has also been set up with primary      care physician and diabetologist.  The patient is discharged home      now.  2. Diabetes.  Hemoglobin A1c is 13.4.  This reflects the level of      noncompliance for the patient.  He said that he was on Amaryl      before and he tolerated it well and so he has been  restarted on      Amaryl.  He started on metformin also now.  He is referred to Dr.      Jacelyn Pi for further diabetology consult.  He may need to be      started on pioglitazone- Actos in view of the fact he has metabolic      syndrome and has extremely high risk for coronary artery disease at      this time.  3. Hypertension.  He restarted on  lisinopril and Coreg by Cardiology.      To followup with Cardiology and PMD for further medication      adjustments.  4. Dyslipidemia.  He has been started on Lipitor 20 mg in view of the      fact that his triglycerides are high and his LDL is extremely high      too.  He has been advised diet and exercise which  has been      explained in detail to the patient as the only way to bring his      lipid levels back to within a safer margin. The patient has      understanding about this.  He will be taking fish oil caps and      Lipitor.  I am not starting fibrate at this time even though his      triglycerides are high.  He will need to be checked by his PMD for      LFTs in 3 to 4 weeks and if they are stable, then fibrates can be      started for triglycerides at the discretion of the PMD at that      time.  Right  now, fibrates are avoided because of the high      potential for liver toxicity with a combination of statin and      fibrates.  5. Obesity and metabolic  syndrome - the patient has been educated      about this issue and he has exhibited understanding about diet and      exercise and weight loss.  6. Tobacco abuse.  The patient has stated that he will stop this on      his own.   DISPOSITION:  To home.   FOLLOWUP:  1. Followup with Dr. Christy Sartorius in one to two weeks - call [phone] for      appointment.  2. Followup with Dr. Jacelyn Pi for diabetes - call [phone] for      appointment.  3. Dr. Francine Graven office - [facility] and Vascular will call      the patient's home for exercise Myoview set up and further  followup      set up.   DISCHARGE INSTRUCTIONS:  1. Adhere to medications.  2. Adhere to appointments.  3. Stop smoking.  4. Weight loss, diet and exercise.  5. Low sodium, heart healthy and 1800 kilocalorie ADA diet advised.   A total of 40 minutes spent on this discharge.      Kieth Brightly, MD  Electronically Signed     UT/MEDQ  D:  10/28/2008  T:  10/28/2008  Job:  [id number]   cc:   Tery Sanfilippo, MD  Maebelle Munroe. Darron Doom, M.D.  Jacelyn Pi, M.D.

## 2011-04-21 ENCOUNTER — Ambulatory Visit: Payer: Self-pay | Admitting: Family Medicine

## 2011-05-11 ENCOUNTER — Encounter: Payer: Self-pay | Admitting: Family Medicine

## 2011-05-11 DIAGNOSIS — I10 Essential (primary) hypertension: Secondary | ICD-10-CM | POA: Insufficient documentation

## 2011-05-11 DIAGNOSIS — I2510 Atherosclerotic heart disease of native coronary artery without angina pectoris: Secondary | ICD-10-CM | POA: Insufficient documentation

## 2011-05-11 DIAGNOSIS — E785 Hyperlipidemia, unspecified: Secondary | ICD-10-CM | POA: Insufficient documentation

## 2011-05-26 ENCOUNTER — Ambulatory Visit (INDEPENDENT_AMBULATORY_CARE_PROVIDER_SITE_OTHER): Payer: BC Managed Care – PPO | Admitting: Family Medicine

## 2011-05-26 ENCOUNTER — Ambulatory Visit: Payer: Self-pay | Admitting: Family Medicine

## 2011-05-26 ENCOUNTER — Encounter: Payer: Self-pay | Admitting: Family Medicine

## 2011-05-26 DIAGNOSIS — I2510 Atherosclerotic heart disease of native coronary artery without angina pectoris: Secondary | ICD-10-CM

## 2011-05-26 DIAGNOSIS — E109 Type 1 diabetes mellitus without complications: Secondary | ICD-10-CM

## 2011-05-26 DIAGNOSIS — E119 Type 2 diabetes mellitus without complications: Secondary | ICD-10-CM

## 2011-05-26 DIAGNOSIS — I10 Essential (primary) hypertension: Secondary | ICD-10-CM

## 2011-05-26 LAB — TSH: TSH: 2.848 u[IU]/mL (ref 0.350–4.500)

## 2011-05-26 LAB — COMPREHENSIVE METABOLIC PANEL
ALT: 23 U/L (ref 0–53)
AST: 26 U/L (ref 0–37)
Alkaline Phosphatase: 48 U/L (ref 39–117)
CO2: 24 mEq/L (ref 19–32)
Sodium: 139 mEq/L (ref 135–145)
Total Bilirubin: 0.8 mg/dL (ref 0.3–1.2)
Total Protein: 7.6 g/dL (ref 6.0–8.3)

## 2011-05-26 LAB — LIPID PANEL
Cholesterol: 195 mg/dL (ref 0–200)
LDL Cholesterol: 121 mg/dL — ABNORMAL HIGH (ref 0–99)
VLDL: 30 mg/dL (ref 0–40)

## 2011-05-26 MED ORDER — CARVEDILOL 6.25 MG PO TABS: 6.2500 mg | ORAL_TABLET | Freq: Two times a day (BID) | ORAL | Status: DC

## 2011-05-26 NOTE — Progress Notes (Signed)
  Subjective:    Patient ID: [name], male    DOB: 31-Jan-1976, 36 y.o.   MRN: [id number]  HPI  Presents for routine follow up for diabetes. Missed several appointments and presents for medication refills and blood work. Improved compliance though still having a difficult time  with evening medications. Fasting blood sugars are in the 150 range.   Not checking post prandial blood sugars.  Interested in returning to [facility] for additional education.  Difficult to establish exercise routine.  Not always making proper food choices.  Review of Systems     Objective:   Physical Exam  Constitutional: He is oriented to person, place, and time. He appears well-developed and well-nourished.  HENT:  Head: Normocephalic and atraumatic.  Nose: Nose normal.  Mouth/Throat: Oropharynx is clear and moist.  Eyes: EOM are normal. Pupils are equal, round, and reactive to light.  Neck: Neck supple. No thyromegaly present.  Cardiovascular: Normal rate, regular rhythm, normal heart sounds and intact distal pulses.   Pulmonary/Chest: Effort normal and breath sounds normal.  Abdominal: Soft. There is no hepatosplenomegaly.  Musculoskeletal: Normal range of motion.  Neurological: He is alert and oriented to person, place, and time. Sensory deficit: without neuropathic changes.          Assessment & Plan:   1. DM (diabetes mellitus)  POCT glycosylated hemoglobin (Hb A1C), Microalbumin, urine, Referral to Nutrition and Diabetes Services  2. CAD (coronary artery disease)  Lipid panel, Referral to Nutrition and Diabetes Services  3. HTN (hypertension)  TSH, Comprehensive metabolic panel, carvedilol (COREG) 6.25 MG tablet, Referral to Nutrition and Diabetes Services   Follow up in 3 months. Anticipatory guidance

## 2011-05-27 LAB — MICROALBUMIN, URINE: Microalb, Ur: 9.95 mg/dL — ABNORMAL HIGH (ref 0.00–1.89)

## 2011-06-05 ENCOUNTER — Telehealth: Payer: Self-pay

## 2011-06-05 NOTE — Telephone Encounter (Signed)
.  [facility] Pt called regarding medication questions and to see that if during his last visit he had blood work to test for low testosterone. Pt was seen 05/26/11 by Dr Darron Doom. Please call patient at [phone]

## 2011-06-07 NOTE — Telephone Encounter (Signed)
LMOM for pt to CB.  Please see labs.. ? Pt never notified?

## 2011-06-08 NOTE — Telephone Encounter (Signed)
Pt notified of labs. We did not check testosterone levels but since pt is needing to come back in 6-8 wks to recheck DM just told pt to talk to Dr. Darron Doom about checking his testosterone levels at that time

## 2011-06-16 ENCOUNTER — Other Ambulatory Visit: Payer: Self-pay | Admitting: Family Medicine

## 2011-06-18 ENCOUNTER — Encounter: Payer: BC Managed Care – PPO | Attending: Family Medicine | Admitting: *Deleted

## 2011-06-18 ENCOUNTER — Telehealth: Payer: Self-pay

## 2011-06-18 ENCOUNTER — Encounter: Payer: Self-pay | Admitting: *Deleted

## 2011-06-18 DIAGNOSIS — E119 Type 2 diabetes mellitus without complications: Secondary | ICD-10-CM | POA: Insufficient documentation

## 2011-06-18 DIAGNOSIS — Z713 Dietary counseling and surveillance: Secondary | ICD-10-CM | POA: Insufficient documentation

## 2011-06-18 NOTE — Progress Notes (Signed)
  Medical Nutrition Therapy:  Appt start time: 1630 end time:  1730.  Assessment:  Primary concerns today: patient here for nutrition counseling for diabetes. 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 He enjoys shooting pool 4 nights a week, hoping to win trip to [place]. Eats a lot of fast food meals or convenience items at home. States he was diagnosed with diabetes at age 36 and had bypass surger in 2010. He checks his BG about twice a week because he doesn't like to prick his finger.   MEDICATIONS: see list, has just started on Novolog insulin sliding scale only   DIETARY INTAKE:  Usual eating pattern includes 3 meals and 1-2 snacks per day.  Everyday foods include variety of all food groups.  Avoided foods include none stated.    24-hr recall:  B ( AM): school cafeteria [place] sausage and scrambled eggs with meds, diet soda or crystal light  Snk ( AM): bag of chips  L ( PM): left overs at house OR fast food; fried chicken, [employer] OR salad bar at [employer] with [place] or [place] ( PM): none  D ( PM): at the pool hall; fatty choices OR at home; take out salad or wings, OR chicken, steamed brown rice, occasionally a vegetable Snk ( PM): pistachio nuts Beverages: diet soda, crystal light, iced tea - both kinds,   Usual physical activity: member at [employer] but doesn't go, used to enjoy walking,  Estimated energy needs: 1800 calories 200 g carbohydrates 135 g protein 50 g fat  Progress Towards Goal(s):  In progress.   Nutritional Diagnosis:  NI-1.5 Excessive energy intake As related to obesity.  As evidenced by BMI of 35.5.    Intervention:  Nutrition counseling and diabetes education provided.  Goals:  Follow Diabetes Meal Plan as instructed  Eat 3 meals and snacks if hungry  Limit carbohydrate intake to 45-60 grams carbohydrate/meal  Limit carbohydrate intake to 0-30 grams carbohydrate/snack  Monitor glucose levels as instructed by your  doctor at alternate times during the day  Aim for 15 mins of physical activity daily; walking, swimming or dancing  Bring food record and glucose log to your next nutrition visit Handouts given during visit include:  Living Well with Diabetes  Carb counting and Food Label handouts  Meal Plan Card  BG Log Sheet  Monitoring/Evaluation:  Dietary intake, exercise, SMBG, and body weight in 8 week(s).

## 2011-06-18 NOTE — Telephone Encounter (Signed)
Pt went to his diabetes class tonight (we scheduled the referral) - he wanted to talk to dr Darron Doom about information he received during his appt. The person he met with told him there were med changes in his chart and he says he was unaware of that. Concerned that he is/is not taking right meds/dosages.   Please call bf

## 2011-06-18 NOTE — Patient Instructions (Addendum)
Goals:  Follow Diabetes Meal Plan as instructed  Eat 3 meals and snacks if hungry  Limit carbohydrate intake to 45-60 grams carbohydrate/meal  Limit carbohydrate intake to 0-30 grams carbohydrate/snack  Monitor glucose levels as instructed by your doctor at alternate times during the day  Aim for 15 mins of physical activity daily; walking, swimming or dancing  Bring food record and glucose log to your next nutrition visit

## 2011-06-20 NOTE — Telephone Encounter (Signed)
Pt called back and wanted to make sure he is supposed to be taking both Metformin and glimeperide bc the nutrition class had told him it looked like the glimeperide had been DCd in computer. Told pt that both Rxs are still showing in the computer, and providers have Rxd them on 2/27 and 3/4. Pt stated he will continue to take both then as he also understood Dr Darron Doom to say she wanted him on both. Transferred pt to appt center to schedule f/up appt.

## 2011-06-20 NOTE — Telephone Encounter (Signed)
LMOM to CB. 

## 2011-07-21 ENCOUNTER — Ambulatory Visit: Payer: BC Managed Care – PPO | Admitting: Family Medicine

## 2011-08-18 ENCOUNTER — Ambulatory Visit: Payer: BC Managed Care – PPO | Admitting: *Deleted

## 2011-09-06 ENCOUNTER — Other Ambulatory Visit: Payer: Self-pay | Admitting: Family Medicine

## 2011-09-10 ENCOUNTER — Other Ambulatory Visit: Payer: Self-pay | Admitting: Physician Assistant

## 2011-09-25 ENCOUNTER — Other Ambulatory Visit: Payer: Self-pay | Admitting: Physician Assistant

## 2011-10-15 ENCOUNTER — Other Ambulatory Visit: Payer: Self-pay | Admitting: Physician Assistant

## 2011-11-14 ENCOUNTER — Ambulatory Visit (INDEPENDENT_AMBULATORY_CARE_PROVIDER_SITE_OTHER): Payer: BC Managed Care – PPO | Admitting: Family Medicine

## 2011-11-14 ENCOUNTER — Encounter: Payer: Self-pay | Admitting: Family Medicine

## 2011-11-14 VITALS — BP 160/78 | HR 68 | Temp 98.3°F | Resp 16 | Ht 65.75 in | Wt 224.6 lb

## 2011-11-14 DIAGNOSIS — I10 Essential (primary) hypertension: Secondary | ICD-10-CM

## 2011-11-14 DIAGNOSIS — E669 Obesity, unspecified: Secondary | ICD-10-CM

## 2011-11-14 DIAGNOSIS — IMO0001 Reserved for inherently not codable concepts without codable children: Secondary | ICD-10-CM

## 2011-11-14 DIAGNOSIS — I2510 Atherosclerotic heart disease of native coronary artery without angina pectoris: Secondary | ICD-10-CM

## 2011-11-14 LAB — POCT GLYCOSYLATED HEMOGLOBIN (HGB A1C): Hemoglobin A1C: 8

## 2011-11-14 MED ORDER — PRAVASTATIN SODIUM 40 MG PO TABS: ORAL_TABLET | ORAL | Status: DC

## 2011-11-14 MED ORDER — GLIMEPIRIDE 2 MG PO TABS: 2.0000 mg | ORAL_TABLET | Freq: Two times a day (BID) | ORAL | Status: DC

## 2011-11-14 MED ORDER — LISINOPRIL-HYDROCHLOROTHIAZIDE 20-12.5 MG PO TABS: 2.0000 | ORAL_TABLET | Freq: Every day | ORAL | Status: DC

## 2011-11-14 MED ORDER — CARVEDILOL 12.5 MG PO TABS: 12.5000 mg | ORAL_TABLET | Freq: Two times a day (BID) | ORAL | Status: DC

## 2011-11-14 MED ORDER — METFORMIN HCL ER 500 MG PO TB24: ORAL_TABLET | ORAL | Status: DC

## 2011-11-14 NOTE — Patient Instructions (Addendum)
Medication change; Coreg (carvedilol) has been increased to 12.5 mg  1 tablet twice a day. You will get a 30-day supply for now until we see if this is the right dose for you without side effects.  All other medications are as discussed. You will need to follow-up in 6 weeks or sooner if you have any problems or feel weak, short of breath or notice any changes for the worse.  Weight loss goal is 2 pounds per months; start going back to gym and work on improving your nutrition as directed by nutritionist.

## 2011-11-19 ENCOUNTER — Encounter: Payer: Self-pay | Admitting: Family Medicine

## 2011-11-19 DIAGNOSIS — Z6839 Body mass index (BMI) 39.0-39.9, adult: Secondary | ICD-10-CM | POA: Insufficient documentation

## 2011-11-19 NOTE — Progress Notes (Signed)
  Subjective:    Patient ID: [name], male    DOB: 12/10/1975, 36 y.o.   MRN: [id number]  HPI  This 36 y.o. AA male has CAD, HTN, Type II DM and hyperlipidemia. He is followed by a  Cardiologist; there is some confusion about his Carvedilol dose. He is better with medication  compliance. Denies hypoglycemia. He has not managed his nutrition well and has gained weight.  Eye exam due.    Review of Systems  Constitutional: Positive for appetite change and unexpected weight change. Negative for fever, activity change and fatigue.  Respiratory: Negative for cough, chest tightness and shortness of breath.   Cardiovascular: Negative for chest pain, palpitations and leg swelling.  Neurological: Negative for dizziness, syncope, facial asymmetry, weakness, light-headedness, numbness and headaches.  Psychiatric/Behavioral: Negative.        Objective:   Physical Exam  Nursing note and vitals reviewed. Constitutional: He is oriented to person, place, and time. He appears well-developed and well-nourished. No distress.  HENT:  Head: Normocephalic and atraumatic.  Right Ear: External ear normal.  Left Ear: External ear normal.  Nose: Nose normal.  Eyes: Conjunctivae and EOM are normal. Pupils are equal, round, and reactive to light. No scleral icterus.  Cardiovascular: Normal rate, regular rhythm and normal heart sounds.  Exam reveals no gallop and no friction rub.   No murmur heard. Pulmonary/Chest: Effort normal and breath sounds normal. No respiratory distress.  Musculoskeletal: Normal range of motion. He exhibits no edema and no tenderness.  Neurological: He is alert and oriented to person, place, and time. No cranial nerve deficit. Coordination normal.  Skin: Skin is warm and dry.  Psychiatric: He has a normal mood and affect. His behavior is normal. Thought content normal.     Results for orders placed in visit on 11/14/11  POCT GLYCOSYLATED HEMOGLOBIN (HGB A1C)      Component  Value Range   Hemoglobin A1C 8.0     Hgb A1C in Feb 2013= 8.1 %      Assessment & Plan:   1. Type II or unspecified type diabetes mellitus without mention of complication, uncontrolled  POCT glycosylated hemoglobin (Hb A1C) Encouraged compliance ! Continue current medications Improve nutrition and exercise  2. HTN (hypertension)  RX: Carvedilol 12.5 mg  1 tablet twice a day  3. CAD (coronary artery disease)    4. Obesity, Class II, BMI 35-39.9, with comorbidity

## 2011-11-20 DIAGNOSIS — H538 Other visual disturbances: Secondary | ICD-10-CM | POA: Insufficient documentation

## 2012-01-08 ENCOUNTER — Encounter: Payer: Self-pay | Admitting: Family Medicine

## 2012-01-08 DIAGNOSIS — H538 Other visual disturbances: Secondary | ICD-10-CM

## 2012-01-08 DIAGNOSIS — IMO0001 Reserved for inherently not codable concepts without codable children: Secondary | ICD-10-CM

## 2012-01-16 ENCOUNTER — Ambulatory Visit: Payer: BC Managed Care – PPO | Admitting: Family Medicine

## 2012-02-20 ENCOUNTER — Ambulatory Visit (INDEPENDENT_AMBULATORY_CARE_PROVIDER_SITE_OTHER): Payer: BC Managed Care – PPO | Admitting: Family Medicine

## 2012-02-20 ENCOUNTER — Encounter: Payer: Self-pay | Admitting: Family Medicine

## 2012-02-20 VITALS — BP 124/80 | HR 87 | Temp 98.4°F | Resp 16 | Ht 66.0 in | Wt 223.6 lb

## 2012-02-20 DIAGNOSIS — I10 Essential (primary) hypertension: Secondary | ICD-10-CM

## 2012-02-20 DIAGNOSIS — E785 Hyperlipidemia, unspecified: Secondary | ICD-10-CM

## 2012-02-20 LAB — POCT GLYCOSYLATED HEMOGLOBIN (HGB A1C): Hemoglobin A1C: 8.2

## 2012-02-20 LAB — BASIC METABOLIC PANEL
Calcium: 9.8 mg/dL (ref 8.4–10.5)
Creat: 0.85 mg/dL (ref 0.50–1.35)

## 2012-02-20 LAB — LIPID PANEL
HDL: 41 mg/dL (ref >39)
Triglycerides: 98 mg/dL (ref <150)

## 2012-02-20 NOTE — Patient Instructions (Signed)
Diabetes and Foot Care Diabetes may cause you to have a poor blood supply (circulation) to your legs and feet. Because of this, the skin may be thinner, break easier, and heal more slowly. You also may have nerve damage in your legs and feet causing decreased feeling. You may not notice minor injuries to your feet that could lead to serious problems or infections. Taking care of your feet is one of the most important things you can do for yourself.  HOME CARE INSTRUCTIONS  Do not go barefoot. Bare feet are easily injured.  Check your feet daily for blisters, cuts, and redness.  Wash your feet with warm water (not hot) and mild soap. Pat your feet and between your toes until completely dry.  Apply a moisturizing lotion that does not contain alcohol or petroleum jelly to the dry skin on your feet and to dry brittle toenails. Do not put it between your toes.  Trim your toenails straight across. Do not dig under them or around the cuticle.  Do not cut corns or calluses, or try to remove them with medicine.  Wear clean cotton socks or stockings every day. Make sure they are not too tight. Do not wear knee high stockings since they may decrease blood flow to your legs.  Wear leather shoes that fit properly and have enough cushioning. To break in new shoes, wear them just a few hours a day to avoid injuring your feet.  Wear shoes at all times, even in the house.  Do not cross your legs. This may decrease the blood flow to your feet.  If you find a minor scrape, cut, or break in the skin on your feet, keep it and the skin around it clean and dry. These areas may be cleansed with mild soap and water. Do not use peroxide, alcohol, iodine or Merthiolate.  When you remove an adhesive bandage, be sure not to harm the skin around it.  If you have a wound, look at it several times a day to make sure it is healing.  Do not use heating pads or hot water bottles. Burns can occur. If you have lost feeling  in your feet or legs, you may not know it is happening until it is too late.  Report any cuts, sores or bruises to your caregiver. Do not wait! SEEK MEDICAL CARE IF:   You have an injury that is not healing or you notice redness, numbness, burning, or tingling.  Your feet always feel cold.  You have pain or cramps in your legs and feet. SEEK IMMEDIATE MEDICAL CARE IF:   There is increasing redness, swelling, or increasing pain in the wound.  There is a red line that goes up your leg.  Pus is coming from a wound.  You develop an unexplained oral temperature above 102 F (38.9 C), or as your caregiver suggests.  You notice a bad smell coming from an ulcer or wound. MAKE SURE YOU:   Understand these instructions.  Will watch your condition.  Will get help right away if you are not doing well or get worse. Document Released: 03/28/2000 Document Revised: 06/23/2011 Document Reviewed: 10/04/2008 [facility] Patient Information 2013 [place].

## 2012-02-24 NOTE — Progress Notes (Signed)
S: This 36 y.o. AA male has chronically uncontrolled DM. He is missing doses of medication and not doing FSBS daily. He has modified diet a little but not really exercising. He denies symptoms c/w hypoglycemia, diaphoresis, fatigue, CP or tightness, palpitations, SOB or DOE, GU/GI problems, HA, numbness, weakness or syncope.  ROS; As per HPI.  O:  Filed Vitals:   02/20/12 1032  BP: 124/80  Pulse: 87  Temp: 98.4 F (36.9 C)  Resp: 16   GEN: In NAD: WN,WD. HEENT: [place]/AT; EOMI w/ clear conj/scl. EACs and oroph normal. NECK: Supple w/o LAN or TMG. COR: RRR; no edema. LUNGS: Normal resp rate and effort. EXT: Feet w/o ulcerations; skin is dry. NEURO: A&O x 3; CNs intact. Nonfocal.   A1c = 8.2%   A/P: 1. Type II or unspecified type diabetes mellitus without mention of complication, uncontrolled  Pt unwilling to change medication doses He agrees to medication compliance and nutrition improvement. Will increase activity level.  2. HTN, goal below 130/80 - stable Basic metabolic panel Continue current medications  3. Hyperlipidemia LDL goal < 100  Lipid panel Continue Pravastatin 40 mg daily

## 2012-02-25 NOTE — Progress Notes (Signed)
Quick Note:  Please call pt and advise that the following labs are abnormal... Kidney function is very good and chemistries are normal; blood sugar is elevated but that was expected. Lipid profile is great and much improved from February of this year.  Continue current medications as discussed and work on better nutrition and taking medications every day. Stay active to try to reduce weight.  Copy to pt. ______

## 2012-06-04 ENCOUNTER — Other Ambulatory Visit: Payer: Self-pay | Admitting: Family Medicine

## 2012-06-18 ENCOUNTER — Encounter: Payer: BC Managed Care – PPO | Admitting: Family Medicine

## 2012-07-12 ENCOUNTER — Other Ambulatory Visit: Payer: Self-pay | Admitting: Family Medicine

## 2012-07-22 ENCOUNTER — Telehealth: Payer: Self-pay

## 2012-07-22 ENCOUNTER — Ambulatory Visit (INDEPENDENT_AMBULATORY_CARE_PROVIDER_SITE_OTHER): Payer: BC Managed Care – PPO | Admitting: Family Medicine

## 2012-07-22 ENCOUNTER — Other Ambulatory Visit: Payer: Self-pay | Admitting: Family Medicine

## 2012-07-22 ENCOUNTER — Ambulatory Visit: Payer: BC Managed Care – PPO

## 2012-07-22 VITALS — BP 140/84 | HR 100 | Temp 98.6°F | Resp 16 | Ht 66.75 in | Wt 240.0 lb

## 2012-07-22 DIAGNOSIS — M25572 Pain in left ankle and joints of left foot: Secondary | ICD-10-CM

## 2012-07-22 DIAGNOSIS — M25579 Pain in unspecified ankle and joints of unspecified foot: Secondary | ICD-10-CM

## 2012-07-22 DIAGNOSIS — M722 Plantar fascial fibromatosis: Secondary | ICD-10-CM

## 2012-07-22 MED ORDER — TRAMADOL HCL 50 MG PO TABS: 50.0000 mg | ORAL_TABLET | Freq: Three times a day (TID) | ORAL | Status: DC | PRN

## 2012-07-22 MED ORDER — MELOXICAM 7.5 MG PO TABS: ORAL_TABLET | ORAL | Status: DC

## 2012-07-22 NOTE — Telephone Encounter (Signed)
He should come in for this, he may be at risk for charcot changes, or wounds. He is advised to come in, he is coming today.

## 2012-07-22 NOTE — Patient Instructions (Addendum)
Use the tramadol as needed for foot pain- be cautious as it can cause drowsiness. Apply ice/ ice massage for your heel Try stretching your foot before getting up in the morning and after sitting, and try rolling on a can.    Get some padded insoles for your shoes Let us know if you are not better in the next week or so- Sooner if worse.

## 2012-07-22 NOTE — Telephone Encounter (Signed)
PATIENT IS DIABETIC AND IS HAVING A SHARP PAIN IN HIS LEFT FOOT UP TO HIS ANKLE. WANTS TO KNOW WHAT HE CAN DO TO STOP THE PAIN. [phone].

## 2012-07-22 NOTE — Progress Notes (Signed)
  Subjective:    Patient ID: [name], male    DOB: March 10, 1976, 37 y.o.   MRN: [id number]  HPI 37 yo male with complaints of L foot pain starting this morning. No known injury. Pain is in bottom of middle of foot, and travels up to his ankle. Unable to bear weight comfortably. Never had anything like this before. Has not tried anything for it. Patient is diabetic, with sugars running in the 200s for the past few days. Normally in the 160s. No reported sensory deficits.   He has been walking more than usual for exercise recently.  Notes that the pain is much worse when he first stands up after sitting or being in bed.     Review of Systems  Constitutional: Negative.   Respiratory: Negative.   Cardiovascular: Negative.   Gastrointestinal: Negative.   Musculoskeletal: Positive for arthralgias (L ankle and foot).       Objective:   Physical Exam  Constitutional: He appears well-developed and well-nourished.  Cardiovascular: Normal rate, regular rhythm and normal heart sounds.   Pulmonary/Chest: Effort normal and breath sounds normal.  Musculoskeletal:       Left foot: He exhibits tenderness and swelling (mild). He exhibits normal range of motion, no crepitus, no deformity and no laceration.       Feet:  Tenderness to palpation on plantar surface of foot that spreads up to medial maleolus. Full ROM. Foot appears mildly swollen. Decreased arch compared to R.   Flat feet bilaterally  [facility] reading (PRIMARY) by  Dr. Lorelei Pont. Left foot: negative  LEFT FOOT - COMPLETE 3+ VIEW  Comparison: None.  Findings: No acute fracture. No dislocation. Mild hallux valgus deformity.  IMPRESSION: No acute bony pathology.      Assessment & Plan:  Pain in joint, ankle and foot, left - Plan: DG Foot Complete Left, traMADol (ULTRAM) 50 MG tablet  Plantar fasciitis of left foot - Plan: DISCONTINUED: meloxicam (MOBIC) 7.5 MG tablet  Plantar fascitis, exacerbated by flat feet and obesity.   Discussed meloxicam but decided against as he has a history of CABG in the past and is taking aspirin which he should not stop.  Will use tramadol instead.    See patient instructions for more details.   Discussed ortho referral/ possible injection but he would like to defer at this time.

## 2012-07-23 ENCOUNTER — Other Ambulatory Visit: Payer: Self-pay | Admitting: Family Medicine

## 2012-07-23 ENCOUNTER — Other Ambulatory Visit: Payer: Self-pay | Admitting: Physician Assistant

## 2012-07-26 ENCOUNTER — Telehealth: Payer: Self-pay

## 2012-07-26 NOTE — Telephone Encounter (Signed)
Called him back- he would like to be out of work for a few days. I will give him a note to be out of work through Thursday.  He does not want to see ortho just yet.  He has ordered a night splint as well.  He will let me know if not improving

## 2012-07-26 NOTE — Telephone Encounter (Signed)
PATIENT STATES THAT HIS PLANTAR FASCIATUS HAS GOTTEN WORSE AND WOULD LIKE TO KNOW IF DR COPLAND CAN SEND HIM TO THERAPY AND WRITE HIM OUT WORK. PLEASE CALL AT [phone].

## 2012-08-12 ENCOUNTER — Ambulatory Visit (INDEPENDENT_AMBULATORY_CARE_PROVIDER_SITE_OTHER): Payer: BC Managed Care – PPO | Admitting: Family Medicine

## 2012-08-12 ENCOUNTER — Encounter: Payer: Self-pay | Admitting: Family Medicine

## 2012-08-12 VITALS — BP 161/87 | HR 88 | Temp 98.3°F | Resp 16 | Ht 66.5 in | Wt 240.0 lb

## 2012-08-12 DIAGNOSIS — Z0000 Encounter for general adult medical examination without abnormal findings: Secondary | ICD-10-CM

## 2012-08-12 DIAGNOSIS — I10 Essential (primary) hypertension: Secondary | ICD-10-CM

## 2012-08-12 DIAGNOSIS — E669 Obesity, unspecified: Secondary | ICD-10-CM

## 2012-08-12 LAB — COMPREHENSIVE METABOLIC PANEL
AST: 14 U/L (ref 0–37)
Albumin: 4.3 g/dL (ref 3.5–5.2)
BUN: 16 mg/dL (ref 6–23)
CO2: 27 mEq/L (ref 19–32)
Calcium: 10.5 mg/dL (ref 8.4–10.5)
Chloride: 103 mEq/L (ref 96–112)
Glucose, Bld: 148 mg/dL — ABNORMAL HIGH (ref 70–99)
Potassium: 4.3 mEq/L (ref 3.5–5.3)

## 2012-08-12 LAB — POCT GLYCOSYLATED HEMOGLOBIN (HGB A1C): Hemoglobin A1C: 9.8

## 2012-08-12 MED ORDER — GLIMEPIRIDE 4 MG PO TABS: ORAL_TABLET | ORAL | Status: DC

## 2012-08-12 MED ORDER — METFORMIN HCL ER 500 MG PO TB24: ORAL_TABLET | ORAL | Status: DC

## 2012-08-12 MED ORDER — LISINOPRIL-HYDROCHLOROTHIAZIDE 20-12.5 MG PO TABS: ORAL_TABLET | ORAL | Status: DC

## 2012-08-12 MED ORDER — CARVEDILOL 12.5 MG PO TABS: 12.5000 mg | ORAL_TABLET | Freq: Two times a day (BID) | ORAL | Status: DC

## 2012-08-12 MED ORDER — PRAVASTATIN SODIUM 40 MG PO TABS: ORAL_TABLET | ORAL | Status: DC

## 2012-08-12 NOTE — Progress Notes (Signed)
Subjective:    Patient ID: [name], male    DOB: [birth year], 37 y.o.   MRN: [id number]  HPI  This 37 y.o. AA male is here for CPE; chronic medical issues include HTN and Type II DM  and require follow-up today. He reports compliance w/ medications but no real meal planning.  He has joined a fitness center and works out 2-3 times a week. He has no problems with cardiac  or pulmonary symptoms during exercise.  Patient Active Problem List   Diagnosis Date Noted  . Blurred vision 11/20/2011  . Obesity, Class II, BMI 35-39.9, with comorbidity 11/19/2011  . Diabetes mellitus 05/11/2011  . Hypertension 05/11/2011  . Hyperlipidemia 05/11/2011  . CAD (coronary artery disease) 05/11/2011     Review of Systems  Constitutional: Negative.   HENT: Negative.   Eyes:       Vision evaluation at least once a year.  Respiratory: Negative for apnea, cough, choking, chest tightness and shortness of breath.   Cardiovascular: Negative.   Gastrointestinal: Negative.   Endocrine: Negative for polydipsia, polyphagia and polyuria.  Genitourinary: Negative.   Neurological: Negative for dizziness, tremors, syncope, weakness, light-headedness, numbness and headaches.  All other systems reviewed and are negative.       Objective:   Physical Exam  Nursing note and vitals reviewed. Constitutional: He is oriented to person, place, and time. Vital signs are normal. He appears well-developed and well-nourished. No distress.  Pt has truncal obesity.  HENT:  Head: Normocephalic and atraumatic.  Right Ear: Hearing, tympanic membrane, external ear and ear canal normal.  Left Ear: Hearing, tympanic membrane, external ear and ear canal normal.  Nose: Nose normal. No mucosal edema, rhinorrhea, nasal deformity or septal deviation.  Mouth/Throat: Uvula is midline, oropharynx is clear and moist and mucous membranes are normal. No oral lesions. Normal dentition. No dental caries.  Eyes: Conjunctivae, EOM and  lids are normal. Pupils are equal, round, and reactive to light. No scleral icterus.  Neck: Normal range of motion. Neck supple. No thyromegaly present.  Cardiovascular: Normal rate, normal heart sounds and intact distal pulses.  Exam reveals no gallop and no friction rub.   No murmur heard. Pulmonary/Chest: Effort normal and breath sounds normal. No respiratory distress. He has no wheezes. He exhibits no tenderness.  Abdominal: Soft. Bowel sounds are normal. He exhibits no distension, no pulsatile midline mass and no mass. There is no hepatosplenomegaly. There is no tenderness. There is no guarding and no CVA tenderness. No hernia.  Musculoskeletal: Normal range of motion. He exhibits no edema and no tenderness.  Lymphadenopathy:    He has no cervical adenopathy.  Neurological: He is alert and oriented to person, place, and time. He has normal reflexes. No cranial nerve deficit. He exhibits normal muscle tone. Coordination normal.  Skin: Skin is warm and dry. No rash noted. No erythema.  Psychiatric: He has a normal mood and affect. His behavior is normal. Judgment and thought content normal.     Results for orders placed in visit on 08/12/12  POCT GLYCOSYLATED HEMOGLOBIN (HGB A1C)      Result Value Range   Hemoglobin A1C 9.8         Assessment & Plan:  Routine general medical examination at a health care facility -   Type II or unspecified type diabetes mellitus without mention of complication, uncontrolled - Encourage healthy lifestyle choices. Glimepiride increase to 4 mg bid before meals.  Plan: Comprehensive metabolic panel, POCT glycosylated hemoglobin (Hb  A1C)  HTN (hypertension) - Nov 2013 BP= 120/80. Continue current medications.  Plan: Comprehensive metabolic panel  Obesity, unspecified - Encouraged weight loss= 2-4 pounds per month. Exercise 3-4 times/ week.   Plan: Vitamin D, 25-hydroxy   Meds ordered this encounter  Medications  . cholecalciferol (VITAMIN D) 1000  UNITS tablet    Sig: Take 1,000 Units by mouth daily.  . carvedilol (COREG) 12.5 MG tablet    Sig: Take 1 tablet (12.5 mg total) by mouth 2 (two) times daily with a meal.    Dispense:  60 tablet    Refill:  5  . lisinopril-hydrochlorothiazide (PRINZIDE,ZESTORETIC) 20-12.5 MG per tablet    Sig: TAKE TWO TABLETS BY MOUTH DAILY    Dispense:  60 tablet    Refill:  5  . pravastatin (PRAVACHOL) 40 MG tablet    Sig: TAKE 2 TABLETS DAILY AFTER EVENING MEAL.    Dispense:  180 tablet    Refill:  1  . glimepiride (AMARYL) 4 MG tablet    Sig: Take 1 tablet twice a day before meals.    Dispense:  60 tablet    Refill:  5  . metFORMIN (GLUCOPHAGE-XR) 500 MG 24 hr tablet    Sig: TAKE TWO TABLETS BY MOUTH TWICE DAILY WITH MEALS    Dispense:  120 tablet    Refill:  5

## 2012-08-12 NOTE — Patient Instructions (Addendum)
Keeping you healthy  Get these tests  Blood pressure- Have your blood pressure checked once a year by your healthcare provider.  Normal blood pressure is 120/80.  Weight- Have your body mass index (BMI) calculated to screen for obesity.  BMI is a measure of body fat based on height and weight. You can also calculate your own BMI at [email or web address].  Cholesterol- Have your cholesterol checked regularly starting at age 38, sooner may be necessary if you have diabetes, high blood pressure, if a family member developed heart diseases at an early age or if you smoke.   Chlamydia, HIV, and other sexual transmitted disease- Get screened each year until the age of 21 then within three months of each new sexual partner.  Diabetes- Have your blood sugar checked regularly if you have high blood pressure, high cholesterol, a family history of diabetes or if you are overweight.  Get these vaccines  Flu shot- Every fall.  Tetanus shot- Every 10 years.  Menactra- Single dose; prevents meningitis.  Take these steps  Don't smoke- If you do smoke, ask your healthcare provider about quitting. For tips on how to quit, go to www.smokefree.gov or call 1-800-QUIT-NOW.  Be physically active- Exercise 5 days a week for at least 30 minutes.  If you are not already physically active start slow and gradually work up to 30 minutes of moderate physical activity.  Examples of moderate activity include walking briskly, mowing the yard, dancing, swimming bicycling, etc.  Eat a healthy diet- Eat a variety of healthy foods such as fruits, vegetables, low fat milk, low fat cheese, yogurt, lean meats, poultry, fish, beans, tofu, etc.  For more information on healthy eating, go to www.thenutritionsource.org  Drink alcohol in moderation- Limit alcohol intake two drinks or less a day.  Never drink and drive.  Dentist- Brush and floss teeth twice daily; visit your dentis twice a year.  Depression-Your emotional  health is as important as your physical health.  If you're feeling down, losing interest in things you normally enjoy please talk with your healthcare provider.  Gun Safety- If you keep a gun in your home, keep it unloaded and with the safety lock on.  Bullets should be stored separately.  Helmet use- Always wear a helmet when riding a motorcycle, bicycle, rollerblading or skateboarding.  Safe sex- If you may be exposed to a sexually transmitted infection, use a condom  Seat belts- Seat bels can save your life; always wear one.  Smoke/Carbon Monoxide detectors- These detectors need to be installed on the appropriate level of your home.  Replace batteries at least once a year.  Skin Cancer- When out in the sun, cover up and use sunscreen SPF 15 or higher.  Violence- If anyone is threatening or hurting you, please tell your healthcare provider.     Diabetes Meal Planning Guide The diabetes meal planning guide is a tool to help you plan your meals and snacks. It is important for people with diabetes to manage their blood glucose (sugar) levels. Choosing the right foods and the right amounts throughout your day will help control your blood glucose. Eating right can even help you improve your blood pressure and reach or maintain a healthy weight. CARBOHYDRATE COUNTING MADE EASY When you eat carbohydrates, they turn to sugar. This raises your blood glucose level. Counting carbohydrates can help you control this level so you feel better. When you plan your meals by counting carbohydrates, you can have more flexibility in what you eat and  balance your medicine with your food intake. Carbohydrate counting simply means adding up the total amount of carbohydrate grams in your meals and snacks. Try to eat about the same amount at each meal. Foods with carbohydrates are listed below. Each portion below is 1 carbohydrate serving or 15 grams of carbohydrates. Ask your dietician how many grams of  carbohydrates you should eat at each meal or snack. Grains and Starches  1 slice bread.   English muffin or hotdog/hamburger bun.   cup cold cereal (unsweetened).   cup cooked pasta or rice.   cup starchy vegetables (corn, potatoes, peas, beans, winter squash).  1 tortilla (6 inches).   bagel.  1 waffle or pancake (size of a CD).   cup cooked cereal.  4 to 6 small crackers. *Whole grain is recommended. Fruit  1 cup fresh unsweetened berries, melon, papaya, pineapple.  1 small fresh fruit.   banana or mango.   cup fruit juice (4 oz unsweetened).   cup canned fruit in natural juice or water.  2 tbs dried fruit.  12 to 15 grapes or cherries. Milk and Yogurt  1 cup fat-free or 1% milk.  1 cup soy milk.  6 oz light yogurt with sugar-free sweetener.  6 oz low-fat soy yogurt.  6 oz plain yogurt. Vegetables  1 cup raw or  cup cooked is counted as 0 carbohydrates or a "free" food.  If you eat 3 or more servings at 1 meal, count them as 1 carbohydrate serving. Other Carbohydrates   oz chips or pretzels.   cup ice cream or frozen yogurt.   cup sherbet or sorbet.  2 inch square cake, no frosting.  1 tbs honey, sugar, jam, jelly, or syrup.  2 small cookies.  3 squares of graham crackers.  3 cups popcorn.  6 crackers.  1 cup broth-based soup.  Count 1 cup casserole or other mixed foods as 2 carbohydrate servings.  Foods with less than 20 calories in a serving may be counted as 0 carbohydrates or a "free" food. You may want to purchase a book or computer software that lists the carbohydrate gram counts of different foods. In addition, the nutrition facts panel on the labels of the foods you eat are a good source of this information. The label will tell you how big the serving size is and the total number of carbohydrate grams you will be eating per serving. Divide this number by 15 to obtain the number of carbohydrate servings in a portion.  Remember, 1 carbohydrate serving equals 15 grams of carbohydrate. SERVING SIZES Measuring foods and serving sizes helps you make sure you are getting the right amount of food. The list below tells how big or small some common serving sizes are.  1 oz.........4 stacked dice.  3 oz........Marland KitchenDeck of cards.  1 tsp.......Marland KitchenTip of little finger.  1 tbs......Marland KitchenMarland KitchenThumb.  2 tbs.......Marland KitchenGolf ball.   cup......Marland KitchenHalf of a fist.  1 cup.......Marland KitchenA fist. SAMPLE DIABETES MEAL PLAN Below is a sample meal plan that includes foods from the grain and starches, dairy, vegetable, fruit, and meat groups. A dietician can individualize a meal plan to fit your calorie needs and tell you the number of servings needed from each food group. However, controlling the total amount of carbohydrates in your meal or snack is more important than making sure you include all of the food groups at every meal. You may interchange carbohydrate containing foods (dairy, starches, and fruits). The meal plan below is an example of a  2000 calorie diet using carbohydrate counting. This meal plan has 17 carbohydrate servings. Breakfast  1 cup oatmeal (2 carb servings).   cup light yogurt (1 carb serving).  1 cup blueberries (1 carb serving).   cup almonds. Snack  1 large apple (2 carb servings).  1 low-fat string cheese stick. Lunch  Chicken breast salad.  1 cup spinach.   cup chopped tomatoes.  2 oz chicken breast, sliced.  2 tbs low-fat [place] dressing.  12 whole-wheat crackers (2 carb servings).  12 to 15 grapes (1 carb serving).  1 cup low-fat milk (1 carb serving). Snack  1 cup carrots.   cup hummus (1 carb serving). Dinner  3 oz broiled salmon.  1 cup brown rice (3 carb servings). Snack  1  cups steamed broccoli (1 carb serving) drizzled with 1 tsp olive oil and lemon juice.  1 cup light pudding (2 carb servings). DIABETES MEAL PLANNING WORKSHEET Your dietician can use this worksheet to help  you decide how many servings of foods and what types of foods are right for you.  BREAKFAST Food Group and Servings / Carb Servings Grain/Starches __________________________________ Dairy __________________________________________ Vegetable ______________________________________ Fruit ___________________________________________ Meat __________________________________________ Fat ____________________________________________ LUNCH Food Group and Servings / Carb Servings Grain/Starches ___________________________________ Dairy ___________________________________________ Fruit ____________________________________________ Meat ___________________________________________ Fat _____________________________________________ [name] Food Group and Servings / Carb Servings Grain/Starches ___________________________________ Dairy ___________________________________________ Fruit ____________________________________________ Meat ___________________________________________ Fat _____________________________________________ SNACKS Food Group and Servings / Carb Servings Grain/Starches ___________________________________ Dairy ___________________________________________ Vegetable _______________________________________ Fruit ____________________________________________ Meat ___________________________________________ Fat _____________________________________________ DAILY TOTALS Starches _________________________ Vegetable ________________________ Fruit ____________________________ Dairy ____________________________ Meat ____________________________ Fat ______________________________ Document Released: 12/26/2004 Document Revised: 06/23/2011 Document Reviewed: 11/06/2008 Exit[employer] Patient Information [address], [place].   Your weight loss goal should be 2-5 pounds per month. Continue to make small changes that you  Can sustain. Eat as healthy as you can and reduce portion sizes. Drink mostly  water and stay active.  I have increased one of your medications: Glimepiride is now 4 mg 1 tablet twice a day before meals.

## 2012-08-13 NOTE — Progress Notes (Signed)
Quick Note:  Please contact pt and advise that the following labs are abnormal... Labs all are normal except blood sugar is above normal as we discussed. Take all medications daily as prescribed and eat as healthy as possible.  Vitamin D is in the normal range but you need to take a supplement 2000 IU 1 capsule daily to increase your level and increase your metabolism.   Copy to pt. ______

## 2012-10-21 ENCOUNTER — Telehealth: Payer: Self-pay

## 2012-10-21 NOTE — Telephone Encounter (Signed)
Pravastatin is one the least expensive generic statins. [facility] had a $4.00 "Generic" list of meds for 30-day supply of medication; Pravastatin was on this list in the past. Did he check with [facility] to see if they have a $4.00 "Generic" list ? Pt needs to check both of these places for inexpensive generics and let us know what he chooses to do.

## 2012-10-21 NOTE — Telephone Encounter (Signed)
Pt would like something cheaper than pravastatin called in, because he recently lost his job and lost his insurance.  [phone] [facility] [address]

## 2012-10-22 MED ORDER — ATORVASTATIN CALCIUM 20 MG PO TABS: ORAL_TABLET | ORAL | Status: DC

## 2012-10-22 NOTE — Telephone Encounter (Signed)
I have prescribed generic Lipitor 20 mg tablets 1 tablet at bedtime. 90-day supply at his pharmacy. I want pt to take this and we will recheck lipids in 3-4 months. If needed, we can increase dose to 40 mg if 20 mg not strong enough.

## 2012-10-22 NOTE — Telephone Encounter (Signed)
Patient advised.

## 2012-10-22 NOTE — Telephone Encounter (Signed)
Pravastatin 20 mg is on the list, but not the 40mg , he would have to take 4 tabs daily, then would have to still pay $16 for this, the Pravastatin 20mg  is the only med now on the $4 list, however Generic Lipitor may be cheapest, even though it is not $4. Please advise.

## 2012-12-24 ENCOUNTER — Ambulatory Visit: Payer: BC Managed Care – PPO | Admitting: Family Medicine

## 2013-02-25 ENCOUNTER — Other Ambulatory Visit: Payer: Self-pay | Admitting: Physician Assistant

## 2013-04-21 ENCOUNTER — Encounter: Payer: Self-pay | Admitting: Family Medicine

## 2013-04-21 ENCOUNTER — Ambulatory Visit (INDEPENDENT_AMBULATORY_CARE_PROVIDER_SITE_OTHER): Payer: BC Managed Care – PPO | Admitting: Family Medicine

## 2013-04-21 VITALS — BP 162/86 | HR 95 | Temp 98.0°F | Resp 16 | Ht 66.0 in | Wt 247.0 lb

## 2013-04-21 DIAGNOSIS — E1165 Type 2 diabetes mellitus with hyperglycemia: Principal | ICD-10-CM

## 2013-04-21 DIAGNOSIS — E1129 Type 2 diabetes mellitus with other diabetic kidney complication: Secondary | ICD-10-CM | POA: Insufficient documentation

## 2013-04-21 DIAGNOSIS — I10 Essential (primary) hypertension: Secondary | ICD-10-CM

## 2013-04-21 DIAGNOSIS — IMO0001 Reserved for inherently not codable concepts without codable children: Secondary | ICD-10-CM

## 2013-04-21 LAB — BASIC METABOLIC PANEL
BUN: 27 mg/dL — AB (ref 6–23)
CHLORIDE: 95 meq/L — AB (ref 96–112)
CO2: 27 mEq/L (ref 19–32)
CREATININE: 1.06 mg/dL (ref 0.50–1.35)
Calcium: 9.1 mg/dL (ref 8.4–10.5)
GLUCOSE: 365 mg/dL — AB (ref 70–99)
POTASSIUM: 4.4 meq/L (ref 3.5–5.3)
Sodium: 134 mEq/L — ABNORMAL LOW (ref 135–145)

## 2013-04-21 LAB — POCT GLYCOSYLATED HEMOGLOBIN (HGB A1C): Hemoglobin A1C: 9.4

## 2013-04-21 MED ORDER — GLIMEPIRIDE 4 MG PO TABS: ORAL_TABLET | ORAL | Status: DC

## 2013-04-21 MED ORDER — LISINOPRIL-HYDROCHLOROTHIAZIDE 20-12.5 MG PO TABS: ORAL_TABLET | ORAL | Status: DC

## 2013-04-21 MED ORDER — CARVEDILOL 12.5 MG PO TABS: 12.5000 mg | ORAL_TABLET | Freq: Two times a day (BID) | ORAL | Status: DC

## 2013-04-21 MED ORDER — ATORVASTATIN CALCIUM 20 MG PO TABS: ORAL_TABLET | ORAL | Status: DC

## 2013-04-21 MED ORDER — METFORMIN HCL ER 500 MG PO TB24: ORAL_TABLET | ORAL | Status: DC

## 2013-04-21 NOTE — Progress Notes (Signed)
S:  This 38 y.o. AA male has Type II DM; he has not been fully compliant w/ treatment plan due to unemployment and finances have caused lapse in medications. New job- works 3- 11 pm and meal times are erratic. Often picks up fast food at [facility]. He reports no time to exercise due to work hours. He has access to fitness equipment in apartment complex.  Patient Active Problem List   Diagnosis Date Noted  . Type II or unspecified type diabetes mellitus without mention of complication, uncontrolled 04/21/2013  . Blurred vision 11/20/2011  . Obesity, Class II, BMI 35-39.9, with comorbidity 11/19/2011  . Hypertension 05/11/2011  . Hyperlipidemia 05/11/2011  . CAD (coronary artery disease) 05/11/2011   PMHx, Surg Hx, Soc and Fam Hx reviewed.  Medications reconciled.  ROS: As per HPI. Pt denies fatigue, diaphoresis, CP or tightness, palpitations, edema, SOB or DOE, cough, myalgias, arthralgias, HA, dizziness, numbness, weakness or syncope.    O: Filed Vitals:   04/21/13 1318  BP: 162/86  Pulse: 95  Temp: 98 F (36.7 C)  Resp: 16   GEN: in NAD; WN,WD, HENT: [place]/AT; EOMI w/ clear conj/sclerae. Otherwise unremarkable. NECK: Supple w/o LAN or TMG. LUNGS: Unlabored resp. COR: RRR. No edema. SKIN: W&D; intact w/o diaphoresis, erythema or rashes. NEURO: A&O x 3; CNs intact. Nonfocal   Results for orders placed in visit on 04/21/13  POCT GLYCOSYLATED HEMOGLOBIN (HGB A1C)      Result Value Range   Hemoglobin A1C 9.4     A/P: Type II or unspecified type diabetes mellitus without mention of complication, uncontrolled - Noncompliant pt; he is cognizant of need for medication compliance, good nutrition and regular exercise. Resume Metformin and Glimepiride- same doses.              Plan: POCT glycosylated hemoglobin (Hb A1C)  Hypertension - Resume previous medications- Carvedilol and Lisinopril-HCTZ- same doses. Plan: Basic metabolic panel  Obesity, Class II, BMI 35-39.9, with comorbidity- Pt  plans to begin weight loss plan with goal of 10 lbs in next 3 months.  Meds ordered this encounter  Medications  . atorvastatin (LIPITOR) 20 MG tablet    Sig: Take 1 tablet by mouth at bedtime.    Dispense:  90 tablet    Refill:  1  . carvedilol (COREG) 12.5 MG tablet    Sig: Take 1 tablet (12.5 mg total) by mouth 2 (two) times daily with a meal.    Dispense:  60 tablet    Refill:  5  . glimepiride (AMARYL) 4 MG tablet    Sig: Take 1 tablet twice a day before meals.    Dispense:  60 tablet    Refill:  5  . lisinopril-hydrochlorothiazide (PRINZIDE,ZESTORETIC) 20-12.5 MG per tablet    Sig: TAKE TWO TABLETS BY MOUTH DAILY    Dispense:  60 tablet    Refill:  5  . metFORMIN (GLUCOPHAGE-XR) 500 MG 24 hr tablet    Sig: TAKE TWO TABLETS BY MOUTH TWICE DAILY WITH MEALS    Dispense:  120 tablet    Refill:  5

## 2013-04-21 NOTE — Patient Instructions (Signed)

## 2013-04-22 NOTE — Progress Notes (Signed)
Quick Note:  Please advise pt regarding following labs... Your Diabetes is not well controlled and your blood sugar is above 350. That is why your sodium is below normal. Better control depends on you taking your medications and managing your diet.  I will see you in March as scheduled.  Copy to pt. ______

## 2013-06-30 ENCOUNTER — Ambulatory Visit: Payer: BC Managed Care – PPO | Admitting: Family Medicine

## 2013-07-25 ENCOUNTER — Telehealth: Payer: Self-pay

## 2013-07-25 NOTE — Telephone Encounter (Signed)
Patient wants to know if he has any limitations before starting physical training to lose weight.  [phone]

## 2013-07-26 NOTE — Telephone Encounter (Signed)
Spoke to pt last night- pt was recommended to increase activity at last OV. Advised pt to work out to his comfort level and watch for chest pain with exertion. Pt understood.

## 2013-07-26 NOTE — Telephone Encounter (Signed)
No medical limitations.

## 2013-12-02 ENCOUNTER — Telehealth: Payer: Self-pay

## 2013-12-02 MED ORDER — LISINOPRIL-HYDROCHLOROTHIAZIDE 20-12.5 MG PO TABS: ORAL_TABLET | ORAL | Status: DC

## 2013-12-02 MED ORDER — ATORVASTATIN CALCIUM 20 MG PO TABS: ORAL_TABLET | ORAL | Status: DC

## 2013-12-02 NOTE — Telephone Encounter (Signed)
Pharm called to req RFs of lisinopril and lipitor. OKd 1 mos of each w/note pt needs ov for more.

## 2013-12-15 ENCOUNTER — Ambulatory Visit (INDEPENDENT_AMBULATORY_CARE_PROVIDER_SITE_OTHER): Payer: BC Managed Care – PPO | Admitting: Family Medicine

## 2013-12-15 ENCOUNTER — Encounter: Payer: Self-pay | Admitting: Family Medicine

## 2013-12-15 VITALS — BP 168/82 | HR 88 | Temp 98.2°F | Resp 16 | Ht 66.5 in | Wt 258.4 lb

## 2013-12-15 DIAGNOSIS — I10 Essential (primary) hypertension: Secondary | ICD-10-CM

## 2013-12-15 DIAGNOSIS — E1165 Type 2 diabetes mellitus with hyperglycemia: Principal | ICD-10-CM

## 2013-12-15 DIAGNOSIS — Z569 Unspecified problems related to employment: Secondary | ICD-10-CM

## 2013-12-15 DIAGNOSIS — IMO0001 Reserved for inherently not codable concepts without codable children: Secondary | ICD-10-CM

## 2013-12-15 DIAGNOSIS — Z566 Other physical and mental strain related to work: Secondary | ICD-10-CM

## 2013-12-15 LAB — COMPLETE METABOLIC PANEL WITH GFR
ALBUMIN: 4.1 g/dL (ref 3.5–5.2)
ALT: 37 U/L (ref 0–53)
AST: 22 U/L (ref 0–37)
Alkaline Phosphatase: 57 U/L (ref 39–117)
BUN: 21 mg/dL (ref 6–23)
CALCIUM: 9.3 mg/dL (ref 8.4–10.5)
CHLORIDE: 99 meq/L (ref 96–112)
CO2: 26 mEq/L (ref 19–32)
Creat: 1.1 mg/dL (ref 0.50–1.35)
GFR, Est African American: >89 mL/min
GFR, Est Non African American: 85 mL/min
Glucose, Bld: 301 mg/dL — ABNORMAL HIGH (ref 70–99)
Potassium: 4 mEq/L (ref 3.5–5.3)
SODIUM: 135 meq/L (ref 135–145)
TOTAL PROTEIN: 6.9 g/dL (ref 6.0–8.3)
Total Bilirubin: 0.5 mg/dL (ref 0.2–1.2)

## 2013-12-15 LAB — POCT GLYCOSYLATED HEMOGLOBIN (HGB A1C): Hemoglobin A1C: 10.9

## 2013-12-15 MED ORDER — SITAGLIP PHOS-METFORMIN HCL ER 50-1000 MG PO TB24: 1.0000 | ORAL_TABLET | Freq: Two times a day (BID) | ORAL | Status: DC

## 2013-12-15 MED ORDER — CARVEDILOL 12.5 MG PO TABS: 12.5000 mg | ORAL_TABLET | Freq: Two times a day (BID) | ORAL | Status: DC

## 2013-12-15 MED ORDER — GLIMEPIRIDE 4 MG PO TABS: ORAL_TABLET | ORAL | Status: DC

## 2013-12-15 MED ORDER — LISINOPRIL-HYDROCHLOROTHIAZIDE 20-12.5 MG PO TABS: ORAL_TABLET | ORAL | Status: DC

## 2013-12-15 MED ORDER — ATORVASTATIN CALCIUM 20 MG PO TABS: ORAL_TABLET | ORAL | Status: DC

## 2013-12-15 NOTE — Patient Instructions (Addendum)
Mediterranean Diet  Why follow it? Research shows.   Those who follow the Mediterranean diet have a reduced risk of heart disease    The diet is associated with a reduced incidence of Parkinson's and Alzheimer's diseases   People following the diet may have longer life expectancies and lower rates of chronic diseases    The Dietary Guidelines for Americans recommends the Mediterranean diet as an eating plan to promote health and prevent disease  What Is the Mediterranean Diet?    Healthy eating plan based on typical foods and recipes of Mediterranean-style cooking   The diet is primarily a plant based diet; these foods should make up a majority of meals   Starches - Plant based foods should make up a majority of meals - They are an important sources of vitamins, minerals, energy, antioxidants, and fiber - Choose whole grains, foods high in fiber and minimally processed items  - Typical grain sources include wheat, oats, barley, corn, brown rice, bulgar, farro, millet, polenta, couscous  - Various types of beans include chickpeas, lentils, fava beans, black beans, white beans   Fruits  Veggies - Large quantities of antioxidant rich fruits & veggies; 6 or more servings  - Vegetables can be eaten raw or lightly drizzled with oil and cooked  - Vegetables common to the traditional Mediterranean Diet include: artichokes, arugula, beets, broccoli, brussel sprouts, cabbage, carrots, celery, collard greens, cucumbers, eggplant, kale, leeks, lemons, lettuce, mushrooms, okra, onions, peas, peppers, potatoes, pumpkin, radishes, rutabaga, shallots, spinach, sweet potatoes, turnips, zucchini - Fruits common to the Mediterranean Diet include: apples, apricots, avocados, cherries, clementines, dates, figs, grapefruits, grapes, melons, nectarines, oranges, peaches, pears, pomegranates, strawberries, tangerines  Fats - Replace butter and margarine with healthy oils, such as olive oil, canola oil, and tahini   - Limit nuts to no more than a handful a day  - Nuts include walnuts, almonds, pecans, pistachios, pine nuts  - Limit or avoid candied, honey roasted or heavily salted nuts - Olives are central to the Mediterranean diet - can be eaten whole or used in a variety of dishes   Meats Protein - Limiting red meat: no more than a few times a month - When eating red meat: choose lean cuts and keep the portion to the size of deck of cards - Eggs: approx. 0 to 4 times a week  - Fish and lean poultry: at least 2 a week  - Healthy protein sources include, chicken, turkey, lean beef, lamb - Increase intake of seafood such as tuna, salmon, trout, mackerel, shrimp, scallops - Avoid or limit high fat processed meats such as sausage and bacon  Dairy - Include moderate amounts of low fat dairy products  - Focus on healthy dairy such as fat free yogurt, skim milk, low or reduced fat cheese - Limit dairy products higher in fat such as whole or 2% milk, cheese, ice cream  Alcohol - Moderate amounts of red wine is ok  - No more than 5 oz daily for women (all ages) and men older than age 65  - No more than 10 oz of wine daily for men younger than 65  Other - Limit sweets and other desserts  - Use herbs and spices instead of salt to flavor foods  - Herbs and spices common to the traditional Mediterranean Diet include: basil, bay leaves, chives, cloves, cumin, fennel, garlic, lavender, marjoram, mint, oregano, parsley, pepper, rosemary, sage, savory, sumac, tarragon, thyme   It's not just a diet,   it's a lifestyle:    The Mediterranean diet includes lifestyle factors typical of those in the region    Foods, drinks and meals are best eaten with others and savored   Daily physical activity is important for overall good health   This could be strenuous exercise like running and aerobics   This could also be more leisurely activities such as walking, housework, yard-work, or taking the stairs   Moderation is the key;  a balanced and healthy diet accommodates most foods and drinks   Consider portion sizes and frequency of consumption of certain foods   Meal Ideas & Options:    Breakfast:  o Whole wheat toast or whole wheat English muffins with peanut butter & hard boiled egg o Steel cut oats topped with apples & cinnamon and skim milk  o Fresh fruit: banana, strawberries, melon, berries, peaches  o Smoothies: strawberries, bananas, greek yogurt, peanut butter o Low fat greek yogurt with blueberries and granola  o Egg white omelet with spinach and mushrooms o Breakfast couscous: whole wheat couscous, apricots, skim milk, cranberries    Sandwiches:  o Hummus and grilled vegetables (peppers, zucchini, squash) on whole wheat bread   o Grilled chicken on whole wheat pita with lettuce, tomatoes, cucumbers or tzatziki  o Tuna salad on whole wheat bread: tuna salad made with greek yogurt, olives, red peppers, capers, green onions o Garlic rosemary lamb pita: lamb sauted with garlic, rosemary, salt & pepper; add lettuce, cucumber, greek yogurt to pita - flavor with lemon juice and black pepper    Seafood:  o Mediterranean grilled salmon, seasoned with garlic, basil, parsley, lemon juice and black pepper o Shrimp, lemon, and spinach whole-grain pasta salad made with low fat greek yogurt  o Seared scallops with lemon orzo  o Seared tuna steaks seasoned salt, pepper, coriander topped with tomato mixture of olives, tomatoes, olive oil, minced garlic, parsley, green onions and cappers    Meats:  o Herbed greek chicken salad with kalamata olives, cucumber, feta  o Red bell peppers stuffed with spinach, bulgur, lean ground beef (or lentils) & topped with feta   o Kebabs: skewers of chicken, tomatoes, onions, zucchini, squash  o [place] burgers: made with red onions, mint, dill, lemon juice, feta cheese topped with roasted red peppers   Vegetarian o Cucumber salad: cucumbers, artichoke hearts, celery, red onion, feta  cheese, tossed in olive oil & lemon juice  o Hummus and whole grain pita points with a greek salad (lettuce, tomato, feta, olives, cucumbers, red onion) o Lentil soup with celery, carrots made with vegetable broth, garlic, salt and pepper  o Tabouli salad: parsley, bulgur, mint, scallions, cucumbers, tomato, radishes, lemon juice, olive oil, salt and pepper.    FOCUS ON BETTER NUTRITION AND REGULAR EXERCISE. TRY TO WORK OUT 3 TIMES A WEEK WITH A TRAINER. STAY WELL HYDRATED. ELIMINATE EXCESSIVE SUGARS, SWEETS AND CARBS ( breads, rice, pasta and potatoes) FROM DIET.  TAKE MEDICATIONS AS DIRECTED.  YOUR NEW MEDICATION IS 1 TABLET TWICE A DAY WITH MEALS.

## 2013-12-15 NOTE — Progress Notes (Signed)
Subjective:    Patient ID: [name], male    DOB: March 02, 1976, 38 y.o.   MRN: [id number]  HPI  This 38 y.o. AA male is herfor HTN and Type II DM follow-up. He acknowledges weight gain; recently joined [employer] and has met w/ a Clinical research associate. Plans to lose 40 lbs. Not checking FSBS due to broken glucometer but now has functioning meter; no reports of values. States he is compliant with medications but cannot recall taking Carvedilol in last few months. Denies recent illness, fatigue or hypoglycemic symptoms. Nutrition- has a "sweet tooth" and is not following a meal plan.  Pt has a very stressful job and states that he is "miserable". Is looking for another position and my have to move back to [place], [place] to find less stressful, more satisfying work.   Patient Active Problem List   Diagnosis Date Noted  . Type II or unspecified type diabetes mellitus without mention of complication, uncontrolled 04/21/2013  . Blurred vision 11/20/2011  . Obesity, Class II, BMI 35-39.9, with comorbidity 11/19/2011  . Hypertension 05/11/2011  . Hyperlipidemia 05/11/2011  . CAD (coronary artery disease) 05/11/2011   Prior to Admission medications   Medication Sig Start Date End Date Taking? Authorizing Provider  aspirin 325 MG tablet Take 325 mg by mouth daily.   Yes Hayden Rasmussen, MD  atorvastatin (LIPITOR) 20 MG tablet Take 1 tablet by mouth at bedtime.   Yes Barton Fanny, MD  carvedilol (COREG) 12.5 MG tablet Take 1 tablet (12.5 mg total) by mouth 2 (two) times daily with a meal.   Yes Barton Fanny, MD  cholecalciferol (VITAMIN D) 1000 UNITS tablet Take 1,000 Units by mouth daily.   Yes Historical Provider, MD  Cinnamon 500 MG TABS Take by mouth daily.   Yes Historical Provider, MD  glimepiride (AMARYL) 4 MG tablet Take 1 tablet twice a day before meals.   Yes Barton Fanny, MD  lisinopril-hydrochlorothiazide (PRINZIDE,ZESTORETIC) 20-12.5 MG per tablet TAKE TWO TABLETS BY MOUTH  DAILY   Yes Barton Fanny, MD  Multiple Vitamin (MULTIVITAMIN) tablet Take 1 tablet by mouth daily.   Yes Historical Provider, MD  NITROSTAT 0.4 MG SL tablet Disslove 1 tablet under the tongue at onset of chest pain.may repeat every 5 minutes up to 3 doses 02/25/13  Yes Tarri Fuller, PA-C  OVER THE COUNTER MEDICATION daily.   Yes Historical Provider, MD  fish oil-omega-3 fatty acids 1000 MG capsule Take 2 g by mouth daily.    Historical Provider, MD  MetFORMIN HCl ER 500 MG TABLET Take 2 tablet by mouth 2 (two) times daily with a meal.    Barton Fanny, MD   History   Social History  . Marital Status: Married    Spouse Name: N/A    Number of Children: N/A  . Years of Education: N/A   Occupational History  . [employer]   Social History Main Topics  . Smoking status: Former Smoker    Quit date: 09/17/2008  . Smokeless tobacco: Never Used  . Alcohol Use: Yes     Comment: drink beer occas. (4-5 a week)  . Drug Use: No  . Sexual Activity: No   Other Topics Concern  . Not on file   Social History Narrative  . No narrative on file    Family History  Problem Relation Age of Onset  . Hypertension Mother   . Heart disease Father   . Hypertension Father   .  Diabetes Father     Review of Systems  Constitutional: Negative.   Eyes: Negative.   Respiratory: Negative.   Cardiovascular: Negative.   Endocrine: Negative.   Genitourinary: Negative.   Musculoskeletal: Negative.   Skin: Negative.   Neurological: Negative.       Objective:   Physical Exam  Nursing note and vitals reviewed. Constitutional: He is oriented to person, place, and time. He appears well-developed and well-nourished. No distress.  HENT:  Head: Normocephalic and atraumatic.  Right Ear: External ear normal.  Left Ear: External ear normal.  Nose: Nose normal.  Mouth/Throat: Oropharynx is clear and moist.  Eyes: Conjunctivae and EOM are normal. No scleral icterus.  Neck: Normal  range of motion. Neck supple.  Cardiovascular: Normal rate and regular rhythm.   Pulmonary/Chest: Effort normal. No respiratory distress.  Musculoskeletal: Normal range of motion. He exhibits no edema and no tenderness.  Neurological: He is alert and oriented to person, place, and time. No cranial nerve deficit. He exhibits normal muscle tone.  Skin: Skin is warm and dry. He is not diaphoretic.  Psychiatric: He has a normal mood and affect. His behavior is normal. Judgment and thought content normal.    Results for orders placed in visit on 12/15/13  POCT GLYCOSYLATED HEMOGLOBIN (HGB A1C)      Result Value Ref Range   Hemoglobin A1C 10.9        Assessment & Plan:  Type II or unspecified type diabetes mellitus without mention of complication, uncontrolled - Pt commits to meal planning and regular fitness routine. He states he can lose 1/2-1 lb per week. Pt does not want to do injectable medications; will try Janumet twice daily in addition to continuation of Glimepiride twice daily. Plan: POCT glycosylated hemoglobin (Hb A1C), HM Diabetes Foot Exam, Ambulatory referral to Podiatry, COMPLETE METABOLIC PANEL WITH GFR  Essential hypertension - Plan: COMPLETE METABOLIC PANEL WITH GFR  Work-related stress- Encourage pt in self-care (mental health as well as physical heath). Work excuse given. Encouraged him to focus on putting some changes in place while out of work to solidify his changes in nutrition and exercise.  Obesity, Class II, BMI 35-39.9, with comorbidity   Meds ordered this encounter  Medications  . OVER THE COUNTER MEDICATION    Sig: daily.  . Cinnamon 500 MG TABS    Sig: Take by mouth daily.  . carvedilol (COREG) 12.5 MG tablet    Sig: Take 1 tablet (12.5 mg total) by mouth 2 (two) times daily with a meal.    Dispense:  60 tablet    Refill:  5  . lisinopril-hydrochlorothiazide (PRINZIDE,ZESTORETIC) 20-12.5 MG per tablet    Sig: TAKE TWO TABLETS BY MOUTH DAILY    Dispense:   60 tablet    Refill:  5  . glimepiride (AMARYL) 4 MG tablet    Sig: Take 1 tablet twice a day before meals.    Dispense:  60 tablet    Refill:  5  . atorvastatin (LIPITOR) 20 MG tablet    Sig: Take 1 tablet by mouth at bedtime.    Dispense:  30 tablet    Refill:  5  . SitaGLIPtin-MetFORMIN HCl 50-1000 MG TB24    Sig: Take 1 tablet by mouth 2 (two) times daily with a meal.    Dispense:  60 tablet    Refill:  5

## 2013-12-16 ENCOUNTER — Telehealth: Payer: Self-pay

## 2013-12-16 NOTE — Telephone Encounter (Signed)
Patient is calling because his employer is requiring a form for a certification to be filled out by his doctor and he sees Dr. Leward Quan. Patient wants to know if it has to be done by Dr. Leward Quan or if any provider assist with filling out the form. Patient was advised to talk to a clinical member to get better advice on what to do. Please call patient [phone]

## 2013-12-18 NOTE — Progress Notes (Signed)
Quick Note:  Please advise pt regarding following labs...  Blood sugar is very high; we discussed medication changes and nutrition modifications as well as regular physical activity. Kidney and liver function are normal.  Copy to pt. ______

## 2013-12-18 NOTE — Telephone Encounter (Signed)
Pt needs to leave form at 102 per normal process; form can be brought to 104 on Monday and I will complete it for pt to pick up from 102 on Tuesday, Sept 8, 2015.

## 2013-12-19 NOTE — Telephone Encounter (Signed)
Advised [name] to drop off form at 102 tomorrow, and we will get it to Dr Leward Quan for completion. [name] agreed.

## 2013-12-20 ENCOUNTER — Encounter: Payer: Self-pay | Admitting: Radiology

## 2013-12-23 DIAGNOSIS — Z0271 Encounter for disability determination: Secondary | ICD-10-CM

## 2013-12-28 ENCOUNTER — Ambulatory Visit: Payer: BC Managed Care – PPO | Admitting: Podiatry

## 2013-12-30 NOTE — Telephone Encounter (Signed)
FMLA packet brought to me on 9/16/ 2015. Will complete ASAP and return to 102 for pick-up.

## 2014-01-03 NOTE — Telephone Encounter (Signed)
Please advise the status of these forms.

## 2014-01-03 NOTE — Telephone Encounter (Signed)
FMLA form completed and returned to [facility] on 01/03/2014 for faxing.

## 2014-01-03 NOTE — Telephone Encounter (Signed)
Pt called in inquiring about status of this ppw? Says it has to be done by the 28th

## 2014-01-11 ENCOUNTER — Telehealth: Payer: Self-pay

## 2014-01-11 NOTE — Telephone Encounter (Signed)
Pt's FMLA ppw was faxed successfully on 01/03/14 to [phone];  Multiple attempts made to contact pt for pick up of ppw without success. FMLA ppw is scanned into pt's chart under media

## 2014-03-14 ENCOUNTER — Ambulatory Visit: Payer: BC Managed Care – PPO | Admitting: Family Medicine

## 2014-03-28 ENCOUNTER — Other Ambulatory Visit: Payer: Self-pay | Admitting: Family Medicine

## 2014-06-27 ENCOUNTER — Ambulatory Visit: Payer: Self-pay | Admitting: Family Medicine

## 2014-08-09 ENCOUNTER — Other Ambulatory Visit: Payer: Self-pay | Admitting: Family Medicine

## 2014-08-10 ENCOUNTER — Ambulatory Visit (INDEPENDENT_AMBULATORY_CARE_PROVIDER_SITE_OTHER): Payer: BLUE CROSS/BLUE SHIELD | Admitting: Family Medicine

## 2014-08-10 ENCOUNTER — Encounter: Payer: Self-pay | Admitting: Family Medicine

## 2014-08-10 ENCOUNTER — Other Ambulatory Visit: Payer: Self-pay | Admitting: Family Medicine

## 2014-08-10 VITALS — BP 160/90 | HR 82 | Temp 98.6°F | Resp 16 | Ht 66.75 in | Wt 251.2 lb

## 2014-08-10 DIAGNOSIS — I10 Essential (primary) hypertension: Secondary | ICD-10-CM

## 2014-08-10 DIAGNOSIS — E1165 Type 2 diabetes mellitus with hyperglycemia: Secondary | ICD-10-CM | POA: Diagnosis not present

## 2014-08-10 DIAGNOSIS — E785 Hyperlipidemia, unspecified: Secondary | ICD-10-CM | POA: Diagnosis not present

## 2014-08-10 DIAGNOSIS — IMO0002 Reserved for concepts with insufficient information to code with codable children: Secondary | ICD-10-CM

## 2014-08-10 LAB — POCT GLYCOSYLATED HEMOGLOBIN (HGB A1C): Hemoglobin A1C: 9

## 2014-08-10 LAB — LDL CHOLESTEROL, DIRECT: Direct LDL: 116 mg/dL — ABNORMAL HIGH

## 2014-08-10 LAB — BASIC METABOLIC PANEL
BUN: 15 mg/dL (ref 6–23)
CO2: 26 meq/L (ref 19–32)
CREATININE: 1.05 mg/dL (ref 0.50–1.35)
Calcium: 9.5 mg/dL (ref 8.4–10.5)
Chloride: 99 mEq/L (ref 96–112)
GLUCOSE: 250 mg/dL — AB (ref 70–99)
Potassium: 4.4 mEq/L (ref 3.5–5.3)
Sodium: 137 mEq/L (ref 135–145)

## 2014-08-10 LAB — ALT: ALT: 24 U/L (ref 0–53)

## 2014-08-10 MED ORDER — LISINOPRIL-HYDROCHLOROTHIAZIDE 20-12.5 MG PO TABS: 2.0000 | ORAL_TABLET | Freq: Every day | ORAL | Status: DC

## 2014-08-10 MED ORDER — CARVEDILOL 12.5 MG PO TABS: 12.5000 mg | ORAL_TABLET | Freq: Two times a day (BID) | ORAL | Status: DC

## 2014-08-10 MED ORDER — GLIMEPIRIDE 4 MG PO TABS: ORAL_TABLET | ORAL | Status: DC

## 2014-08-10 MED ORDER — BLOOD PRESSURE MONITOR/L CUFF MISC: 1.0000 | Freq: Every day | Status: DC

## 2014-08-10 MED ORDER — ATORVASTATIN CALCIUM 20 MG PO TABS: ORAL_TABLET | ORAL | Status: DC

## 2014-08-10 MED ORDER — METFORMIN HCL 1000 MG PO TABS: 1000.0000 mg | ORAL_TABLET | Freq: Two times a day (BID) | ORAL | Status: DC

## 2014-08-10 NOTE — Patient Instructions (Addendum)
"Eat Fat, Get Thin"- Dr. Rosita Kea has written a book that can help you get Diabetes controlled and lose weight.  Commit to regular exercise routine 2-3 days per week.  Make wise food choices and you will be able to lower your A1c to target of 6.5-7.0 % by the end of this year.   Basic Carbohydrate Counting for Diabetes Mellitus Carbohydrate counting is a method for keeping track of the amount of carbohydrates you eat. Eating carbohydrates naturally increases the level of sugar (glucose) in your blood, so it is important for you to know the amount that is okay for you to have in every meal. Carbohydrate counting helps keep the level of glucose in your blood within normal limits. The amount of carbohydrates allowed is different for every person. A dietitian can help you calculate the amount that is right for you. Once you know the amount of carbohydrates you can have, you can count the carbohydrates in the foods you want to eat. Carbohydrates are found in the following foods:  Grains, such as breads and cereals.  Dried beans and soy products.  Starchy vegetables, such as potatoes, peas, and corn.  Fruit and fruit juices.  Milk and yogurt.  Sweets and snack foods, such as cake, cookies, candy, chips, soft drinks, and fruit drinks. CARBOHYDRATE COUNTING There are two ways to count the carbohydrates in your food. You can use either of the methods or a combination of both. Reading the "Nutrition Facts" on [place] The "Nutrition Facts" is an area that is included on the labels of almost all packaged food and beverages in the [place]. It includes the serving size of that food or beverage and information about the nutrients in each serving of the food, including the grams (g) of carbohydrate per serving.  Decide the number of servings of this food or beverage that you will be able to eat or drink. Multiply that number of servings by the number of grams of carbohydrate that is listed on the  label for that serving. The total will be the amount of carbohydrates you will be having when you eat or drink this food or beverage. Learning Standard Serving Sizes of Food When you eat food that is not packaged or does not include "Nutrition Facts" on the label, you need to measure the servings in order to count the amount of carbohydrates.A serving of most carbohydrate-rich foods contains about 15 g of carbohydrates. The following list includes serving sizes of carbohydrate-rich foods that provide 15 g ofcarbohydrate per serving:   1 slice of bread (1 oz) or 1 six-inch tortilla.    of a hamburger bun or English muffin.  4-6 crackers.   cup unsweetened dry cereal.    cup hot cereal.   cup rice or pasta.    cup mashed potatoes or  of a large baked potato.  1 cup fresh fruit or one small piece of fruit.    cup canned or frozen fruit or fruit juice.  1 cup milk.   cup plain fat-free yogurt or yogurt sweetened with artificial sweeteners.   cup cooked dried beans or starchy vegetable, such as peas, corn, or potatoes.  Decide the number of standard-size servings that you will eat. Multiply that number of servings by 15 (the grams of carbohydrates in that serving). For example, if you eat 2 cups of strawberries, you will have eaten 2 servings and 30 g of carbohydrates (2 servings x 15 g = 30 g). For foods such as  soups and casseroles, in which more than one food is mixed in, you will need to count the carbohydrates in each food that is included. EXAMPLE OF CARBOHYDRATE COUNTING Sample Dinner  3 oz chicken breast.   cup of brown rice.   cup of corn.  1 cup milk.   1 cup strawberries with sugar-free whipped topping.  Carbohydrate Calculation Step 1: Identify the foods that contain carbohydrates:   Rice.   Corn.   Milk.   Strawberries. Step 2:Calculate the number of servings eaten of each:   2 servings of rice.   1 serving of corn.   1 serving  of milk.   1 serving of strawberries. Step 3: Multiply each of those number of servings by 15 g:   2 servings of rice x 15 g = 30 g.   1 serving of corn x 15 g = 15 g.   1 serving of milk x 15 g = 15 g.   1 serving of strawberries x 15 g = 15 g. Step 4: Add together all of the amounts to find the total grams of carbohydrates eaten: 30 g + 15 g + 15 g + 15 g = 75 g. Document Released: 03/31/2005 Document Revised: 08/15/2013 Document Reviewed: 02/25/2013 [facility] Patient Information 2015 [place], [place]. This information is not intended to replace advice given to you by your health care provider. Make sure you discuss any questions you have with your health care provider.

## 2014-08-13 NOTE — Progress Notes (Signed)
Quick Note:  Please advise pt regarding following labs... Blood sugar is above normal; sodium, potassium and calcium are normal. Kidney function is normal. LDL ("bad") cholesterol is above normal; goal LDL for diabetics is below 80. You are taking medication to lower cholesterol; continue this medications and focus on weight loss and better control of Diabetes. Your A1c will be rechecked at your next visit in July; complete lipid panel needs to be checked again in next 3-6 months.  Copy to pt. ______

## 2014-08-13 NOTE — Progress Notes (Signed)
S: This 39 y.o male is her for HTN and Type II DM follow-up. HTN not well controlled; he did not take medications this AM because he is fasting.  Pt denies diaphoresis, fatigue, CP or tightness, palpitations, edema, SOB or DOE, HA, dizziness, weakness, numbness or syncope. He has not been compliant with Diabetes medications due to cost of Janumet. Last A1c= 10.9%.  States taking other medications as prescribed. He has manage to lose some weight. Pt has a stressful job with [employer] and work hours result in unhealthy eating habits and late night eating.   Patient Active Problem List   Diagnosis Date Noted  . Type II or unspecified type diabetes mellitus without mention of complication, uncontrolled 04/21/2013  . Blurred vision 11/20/2011  . Obesity, Class II, BMI 35-39.9, with comorbidity 11/19/2011  . Hypertension 05/11/2011  . Hyperlipidemia 05/11/2011  . CAD (coronary artery disease) 05/11/2011    Prior to Admission medications   Medication Sig Start Date End Date Taking? Authorizing Provider  aspirin 325 MG tablet Take 325 mg by mouth daily.   Yes Hayden Rasmussen, MD  atorvastatin (LIPITOR) 20 MG tablet Take 1 tablet by mouth at bedtime.   Yes Barton Fanny, MD  carvedilol (COREG) 12.5 MG tablet Take 1 tablet (12.5 mg total) by mouth 2 (two) times daily with a meal.   Yes Barton Fanny, MD  cholecalciferol (VITAMIN D) 1000 UNITS tablet Take 1,000 Units by mouth daily.   Yes Historical Provider, MD  Cinnamon 500 MG TABS Take by mouth daily.   Yes Historical Provider, MD  fish oil-omega-3 fatty acids 1000 MG capsule Take 2 g by mouth daily.   Yes Historical Provider, MD  glimepiride (AMARYL) 4 MG tablet Take 1 tablet twice a day before meals.   Yes Barton Fanny, MD  lisinopril-hydrochlorothiazide (PRINZIDE,ZESTORETIC) 20-12.5 MG per tablet Take 2 tablets by mouth daily.   Yes Barton Fanny, MD  Multiple Vitamin (MULTIVITAMIN) tablet Take 1 tablet by mouth  daily.   Yes Historical Provider, MD  NITROSTAT 0.4 MG SL tablet Disslove 1 tablet under the tongue at onset of chest pain.may repeat every 5 minutes up to 3 doses 02/25/13  Yes Brett Canales, PA-C  OVER THE COUNTER MEDICATION daily.   Yes Historical Provider, MD  metFORMIN (GLUCOPHAGE) 1000 MG tablet Take 1 tablet (1,000 mg total) by mouth 2 (two) times daily with a meal.    Barton Fanny, MD    Past Surgical History  Procedure Laterality Date  . Coronary artery bypass graft      History   Social History  . Marital Status: Married    Spouse Name: N/A  . Number of Children: N/A  . Years of Education: N/A   Occupational History  . PC technician [employer]   Social History Main Topics  . Smoking status: Former Smoker    Quit date: 09/17/2008  . Smokeless tobacco: Never Used  . Alcohol Use: Yes     Comment: drink beer occas. (4-5 a week)  . Drug Use: No  . Sexual Activity: No   Other Topics Concern  . Not on file   Social History Narrative    Family History  Problem Relation Age of Onset  . Hypertension Mother   . Heart disease Father   . Hypertension Father   . Diabetes Father     ROS: As per HPI.  O: Filed Vitals:   08/10/14 0940 08/10/14 1050  BP: 178/90 160/90  Pulse: 82   Temp: 98.6 F (37 C)   TempSrc: Oral   Resp: 16   Height: 5' 6.75" (1.695 m)   Weight: 251 lb 3.2 oz (113.944 kg)   SpO2: 97%     GEN: In NAD: WN,WD. Weight down 8 lbs since Sept 2015. HENT: [place]/AT; EOMI w/ clear conj/sclerae. Otherwise unremarkable. COR: RRR. No edema. LUNGS: Normal resp rate and effort. SKIN: W&D; intact w/o diaphoresis or lesions. MS: MAEs; no c/c/e. NEURO: A&O x 3; CNs intact, Nonfocal. See DM FOOT EXAM.  A1c= 9.0%.  A/P: Essential hypertension - Continue current medications; prescribe BP cuff for home monitoring. Plan: Basic metabolic panel  Type II diabetes mellitus, uncontrolled - Improving control; encourage commitment ot healthier  lifestyle. Focus on healthy snacking and meal planning due to work hours. Plan: HM Diabetes Foot Exam, POCT glycosylated hemoglobin (Hb A1C)  Hyperlipidemia - Continue CAD risk reduction w/ ASA and statin. Plan: ALT, LDL Cholesterol, Direct   Meds ordered this encounter  Medications  . DISCONTD: JANUMET 50-1000 MG per tablet    Sig:   . carvedilol (COREG) 12.5 MG tablet    Sig: Take 1 tablet (12.5 mg total) by mouth 2 (two) times daily with a meal.    Dispense:  60 tablet    Refill:  5  . atorvastatin (LIPITOR) 20 MG tablet    Sig: Take 1 tablet by mouth at bedtime.    Dispense:  30 tablet    Refill:  5  . glimepiride (AMARYL) 4 MG tablet    Sig: Take 1 tablet twice a day before meals.    Dispense:  60 tablet    Refill:  5  . lisinopril-hydrochlorothiazide (PRINZIDE,ZESTORETIC) 20-12.5 MG per tablet    Sig: Take 2 tablets by mouth daily.    Dispense:  60 tablet    Refill:  5  . metFORMIN (GLUCOPHAGE) 1000 MG tablet    Sig: Take 1 tablet (1,000 mg total) by mouth 2 (two) times daily with a meal.    Dispense:  60 tablet    Refill:  5  . Blood Pressure Monitoring (BLOOD PRESSURE MONITOR/L CUFF) MISC    Sig: 1 Device by Does not apply route daily.    Dispense:  1 each    Refill:  0    Please provide a large wrist cuff for checking blood pressure.

## 2014-09-11 ENCOUNTER — Other Ambulatory Visit: Payer: Self-pay | Admitting: Physician Assistant

## 2014-09-13 ENCOUNTER — Other Ambulatory Visit: Payer: Self-pay | Admitting: Internal Medicine

## 2014-09-13 MED ORDER — NITROGLYCERIN 0.4 MG SL SUBL: 0.4000 mg | SUBLINGUAL_TABLET | SUBLINGUAL | Status: DC | PRN

## 2014-09-13 NOTE — Telephone Encounter (Signed)
°  1. Which medications need to be refilled? Nitrostat 2. Which pharmacy is medication to be sent to?[phone]  3. Do they need a 30 day or 90 day supply?   4. Would they like a call back once the medication has been sent to the pharmacy? yes

## 2014-11-07 ENCOUNTER — Ambulatory Visit: Payer: BLUE CROSS/BLUE SHIELD | Admitting: Physician Assistant

## 2014-11-14 ENCOUNTER — Ambulatory Visit: Payer: BLUE CROSS/BLUE SHIELD | Admitting: Physician Assistant

## 2014-11-15 ENCOUNTER — Ambulatory Visit: Payer: BLUE CROSS/BLUE SHIELD | Admitting: Physician Assistant

## 2014-11-15 ENCOUNTER — Encounter: Payer: Self-pay | Admitting: Physician Assistant

## 2014-11-15 ENCOUNTER — Ambulatory Visit (INDEPENDENT_AMBULATORY_CARE_PROVIDER_SITE_OTHER): Payer: BLUE CROSS/BLUE SHIELD | Admitting: Physician Assistant

## 2014-11-15 VITALS — BP 150/96 | HR 99 | Temp 99.1°F | Resp 16 | Ht 67.0 in | Wt 241.0 lb

## 2014-11-15 DIAGNOSIS — E1165 Type 2 diabetes mellitus with hyperglycemia: Secondary | ICD-10-CM

## 2014-11-15 DIAGNOSIS — Z23 Encounter for immunization: Secondary | ICD-10-CM | POA: Diagnosis not present

## 2014-11-15 DIAGNOSIS — IMO0002 Reserved for concepts with insufficient information to code with codable children: Secondary | ICD-10-CM

## 2014-11-15 DIAGNOSIS — E785 Hyperlipidemia, unspecified: Secondary | ICD-10-CM

## 2014-11-15 DIAGNOSIS — I25709 Atherosclerosis of coronary artery bypass graft(s), unspecified, with unspecified angina pectoris: Secondary | ICD-10-CM

## 2014-11-15 DIAGNOSIS — I10 Essential (primary) hypertension: Secondary | ICD-10-CM

## 2014-11-15 DIAGNOSIS — Z114 Encounter for screening for human immunodeficiency virus [HIV]: Secondary | ICD-10-CM | POA: Diagnosis not present

## 2014-11-15 DIAGNOSIS — IMO0001 Reserved for inherently not codable concepts without codable children: Secondary | ICD-10-CM

## 2014-11-15 LAB — GLUCOSE, POCT (MANUAL RESULT ENTRY): POC GLUCOSE: 282 mg/dL — AB (ref 70–99)

## 2014-11-15 LAB — POCT GLYCOSYLATED HEMOGLOBIN (HGB A1C): Hemoglobin A1C: 10.5

## 2014-11-15 MED ORDER — CARVEDILOL 25 MG PO TABS: 12.5000 mg | ORAL_TABLET | Freq: Two times a day (BID) | ORAL | Status: DC

## 2014-11-15 MED ORDER — EMPAGLIFLOZIN 10 MG PO TABS: 10.0000 mg | ORAL_TABLET | Freq: Every day | ORAL | Status: DC

## 2014-11-15 NOTE — Progress Notes (Signed)
   Subjective:    Patient ID: [name], male    DOB: 04-Dec-1975, 39 y.o.   MRN: [id number]   HPI Pt presents for routine FU of his HTN/DM2.  Pt states that he was visiting his mom in [place] overnight, and forgot to pack his medications, so he has not taken his medications today.Pt is still working at [employer]- last visit he expressed that his job made eating healthy difficult, as he was grabbing fast food on the go and eating late at night. He states that instead of fast food after work, he has been eating tuna at home. Pt states he does not regularly check his sugars. Pt reports foot checks at home and denies and new lesions, numbness, or tingling. Pt denies visual changes, dizziness, dysuria, and frequency/urgency. He endorses HA's that occur 2-3 times per week and last for 15 minutes. He attributes HA to high BP and they spontaneously resolve without use of medication. Pt has been checking BP at home -190's/ 116 at home made him concerned for his health. Pt denies CP, palpitations, SOB, DOE, dizziness, and orthopnea. Pt denies regular exercise at this time. He is a member at [employer] but does not go.  Review of Systems    as listed in HPI. Objective:   Physical Exam  Constitutional: He is oriented to person, place, and time. He appears well-developed and well-nourished. No distress.  BP 187/98 mmHg  Pulse 99  Temp(Src) 99.1 F (37.3 C) (Oral)  Resp 16  Ht 5\' 7"  (1.702 m)  Wt 241 lb (109.317 kg)  BMI 37.74 kg/m2   HENT:  Head: Normocephalic and atraumatic.  Neck: Normal range of motion. Neck supple. No tracheal deviation present. No thyromegaly present.  Cardiovascular: Normal rate, normal heart sounds and intact distal pulses.  Exam reveals no gallop and no friction rub.   No murmur heard. Pulmonary/Chest: Effort normal and breath sounds normal. No respiratory distress. He has no wheezes. He has no rales.  Lymphadenopathy:    He has no cervical adenopathy.  Neurological:  He is alert and oriented to person, place, and time.  Skin: Skin is warm and dry. No rash noted. He is not diaphoretic. No erythema. No pallor.  Psychiatric: He has a normal mood and affect. His behavior is normal. Judgment and thought content normal.      Assessment & Plan:  1. Essential hypertension - carvedilol (COREG) 25 MG tablet; Take 0.5 tablets (12.5 mg total) by mouth 2 (two) times daily with a meal.  Dispense: 180 tablet; Refill: 3  2. Type II diabetes mellitus, uncontrolled - POCT glucose (manual entry) - POCT glycosylated hemoglobin (Hb A1C) - Comprehensive metabolic panel - Microalbumin, urine - empagliflozin (JARDIANCE) 10 MG TABS tablet; Take 10 mg by mouth daily.  Dispense: 90 tablet; Refill: 3 - Ambulatory referral to diabetic education  3. Hyperlipidemia - Lipid panel  4. Obesity, Class II, BMI 35-39.9, with comorbidity  5. Screening for HIV (human immunodeficiency virus) - HIV antibody  7. 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  Dispense: 180 tablet; Refill: 3 - Ambulatory referral to Cardiology - diet: continue to drink water and limit soda/lemonade. Low salt, low fat diet. Incorporate exercise into daily routine.

## 2014-11-15 NOTE — Progress Notes (Signed)
Patient ID: [name], male    DOB: [birth year], 39 y.o.   MRN: [id number]  PCP: Hayden Rasmussen., MD  Subjective:   Chief Complaint  Patient presents with  . Diabetes    HPI Presents for evaluation of Diabetes and HTN. He has known CAD s/p 3V CABG at age 85.   Previous PCP was Dr. Darron Doom, who left this practice several years ago. In the interim he was followed by Dr. Leward Quan who retired several months ago. New to me today. NOT fasting this morning for labs.  Has not seen cardiology is quite some time. Previously followed by Dr. Debara Pickett.  Pt states that he was visiting his mom in [place] overnight, and forgot to pack his medications, so he has not taken his medications today.  Pt is still working at [employer]- last visit he expressed that his job made eating healthy difficult, as he was grabbing fast food on the go and eating late at night. He states that instead of fast food after work, he has been eating tuna at home.   Pt states he does not regularly check his sugars.  Pt reports foot checks at home and denies and new lesions, numbness, or tingling.  Pt denies visual changes, dizziness, dysuria, and frequency/urgency.  He endorses HA's that occur 2-3 times per week and last for 15 minutes.  He attributes HA to high BP and they spontaneously resolve without use of medication.  Pt has been checking BP at home -190's/ 116 at home made him concerned for his health.  Pt denies CP, palpitations, SOB, DOE, dizziness, and orthopnea. Pt denies regular     Review of Systems  Constitutional: Negative.   Eyes: Negative for visual disturbance.  Respiratory: Negative for cough, choking, chest tightness, shortness of breath and wheezing.   Cardiovascular: Negative for chest pain, palpitations and leg swelling.  Gastrointestinal: Negative for nausea, vomiting, diarrhea and constipation.  Genitourinary: Negative for dysuria, urgency, frequency and hematuria.  Musculoskeletal:  Negative for myalgias and arthralgias.  Skin: Negative for rash.  Neurological: Positive for headaches. Negative for dizziness, weakness and light-headedness.  Psychiatric/Behavioral: Negative for sleep disturbance and dysphoric mood. The patient is not nervous/anxious.        Patient Active Problem List   Diagnosis Date Noted  . Type II or unspecified type diabetes mellitus without mention of complication, uncontrolled 04/21/2013  . Blurred vision 11/20/2011  . Obesity, Class II, BMI 35-39.9, with comorbidity 11/19/2011  . Hypertension 05/11/2011  . Hyperlipidemia 05/11/2011  . CAD (coronary artery disease) 05/11/2011     Prior to Admission medications   Medication Sig Start Date End Date Taking? Authorizing Provider  aspirin 325 MG tablet Take 325 mg by mouth daily.   Yes Hayden Rasmussen, MD  atorvastatin (LIPITOR) 20 MG tablet Take 1 tablet by mouth at bedtime. 08/10/14  Yes Barton Fanny, MD  Blood Pressure Monitoring (BLOOD PRESSURE MONITOR/L CUFF) MISC 1 Device by Does not apply route daily. 08/10/14  Yes Barton Fanny, MD  carvedilol (COREG) 12.5 MG tablet Take 1 tablet (12.5 mg total) by mouth 2 (two) times daily with a meal. 08/10/14 08/10/15 Yes Barton Fanny, MD  fish oil-omega-3 fatty acids 1000 MG capsule Take 2 g by mouth daily.   Yes Historical Provider, MD  glimepiride (AMARYL) 4 MG tablet Take 1 tablet twice a day before meals. 08/10/14  Yes Barton Fanny, MD  lisinopril-hydrochlorothiazide (PRINZIDE,ZESTORETIC) 20-12.5 MG per tablet Take 2 tablets by  mouth daily. 08/10/14  Yes Barton Fanny, MD  metFORMIN (GLUCOPHAGE) 1000 MG tablet Take 1 tablet (1,000 mg total) by mouth 2 (two) times daily with a meal. 08/10/14  Yes Barton Fanny, MD  Multiple Vitamin (MULTIVITAMIN) tablet Take 1 tablet by mouth daily.   Yes Historical Provider, MD  nitroGLYCERIN (NITROSTAT) 0.4 MG SL tablet Place 1 tablet (0.4 mg total) under the tongue every 5 (five)  minutes as needed for chest pain. REQUEST FUTURE REFILLS FROM PCP 09/13/14  Yes Pixie Casino, MD  OVER THE COUNTER MEDICATION daily.   Yes Historical Provider, MD  cholecalciferol (VITAMIN D) 1000 UNITS tablet Take 1,000 Units by mouth daily.    Historical Provider, MD  Cinnamon 500 MG TABS Take by mouth daily.    Historical Provider, MD     No Known Allergies     Objective:  Physical Exam  Constitutional: He is oriented to person, place, and time. Vital signs are normal. He appears well-developed and well-nourished. He is active and cooperative. No distress.  BP 150/96 mmHg  Pulse 99  Temp(Src) 99.1 F (37.3 C) (Oral)  Resp 16  Ht 5\' 7"  (1.702 m)  Wt 241 lb (109.317 kg)  BMI 37.74 kg/m2  HENT:  Head: Normocephalic and atraumatic.  Right Ear: Hearing normal.  Left Ear: Hearing normal.  Eyes: Conjunctivae are normal. No scleral icterus.  Neck: Normal range of motion. Neck supple. No thyromegaly present.  Cardiovascular: Normal rate, regular rhythm and normal heart sounds.   Pulses:      Radial pulses are 2+ on the right side, and 2+ on the left side.  Pulmonary/Chest: Effort normal and breath sounds normal.  Lymphadenopathy:       Head (right side): No tonsillar, no preauricular, no posterior auricular and no occipital adenopathy present.       Head (left side): No tonsillar, no preauricular, no posterior auricular and no occipital adenopathy present.    He has no cervical adenopathy.       Right: No supraclavicular adenopathy present.       Left: No supraclavicular adenopathy present.  Neurological: He is alert and oriented to person, place, and time. No sensory deficit.  Skin: Skin is warm, dry and intact. No rash noted. No cyanosis or erythema. Nails show no clubbing.  Psychiatric: He has a normal mood and affect. His speech is normal and behavior is normal.   Results for orders placed or performed in visit on 11/15/14  POCT glucose (manual entry)  Result Value Ref Range    POC Glucose 282 (A) 70 - 99 mg/dl  POCT glycosylated hemoglobin (Hb A1C)  Result Value Ref Range   Hemoglobin A1C 10.5       Assessment & Plan:   1. Essential hypertension Uncontrolled. Continue lisinoprilHCT at current dose. INCREASE carvedilol to 25 mg BID.  - carvedilol (COREG) 25 MG tablet; Take 0.5 tablets (12.5 mg total) by mouth 2 (two) times daily with a meal.  Dispense: 180 tablet; Refill: 3  2. Type II diabetes mellitus, uncontrolled Uncontrolled. Continue metformin and glimepiride as before. ADD Jardiance. Refer to diabetes and nutrition education (he went before but didn't follow up with appointments). Increase exercise. - POCT glucose (manual entry) - POCT glycosylated hemoglobin (Hb A1C) - Comprehensive metabolic panel - Microalbumin, urine - empagliflozin (JARDIANCE) 10 MG TABS tablet; Take 10 mg by mouth daily.  Dispense: 90 tablet; Refill: 3 - Ambulatory referral to diabetic education  3. Hyperlipidemia Await labs. Increase statin dose  if indicated (LDL >70). - Lipid panel  4. Obesity, Class II, BMI 35-39.9, with comorbidity See above.  5. Screening for HIV (human immunodeficiency virus) - HIV antibody  6. Need for pneumococcal vaccination Declined this and all other vaccines. "I don't like needles."  7. Coronary artery disease involving coronary bypass graft of native heart with unspecified angina pectoris Get back in with cardiology. Lifestyle changes as above. Needs improved HTN and DM control to reduce risk of another event. - carvedilol (COREG) 25 MG tablet; Take 0.5 tablets (12.5 mg total) by mouth 2 (two) times daily with a meal.  Dispense: 180 tablet; Refill: 3 - Ambulatory referral to Cardiology   Fara Chute, PA-C Physician Assistant-Certified [facility]

## 2014-11-15 NOTE — Patient Instructions (Addendum)
1. Restart an aspirin 325 mg every day. 2. Take your medication every single day. Take it with you if you might go out of town. 3. Your goal blood sugar is less than 140 in the mornings before you have anything to eat or drink, and less than 180 2-3 hours after your largest meal of the day. 4. Your goal blood pressure is less than 140/90. 5. You need to schedule with a dentist. 6. INCREASE the carvedilol to 25 mg twice each day (you can take 2 of the 12.5 mg tablets twice a day until you use them up). 7. Continue your other medications as before.

## 2014-11-16 LAB — COMPREHENSIVE METABOLIC PANEL
ALBUMIN: 3.6 g/dL (ref 3.6–5.1)
ALT: 24 U/L (ref 9–46)
AST: 18 U/L (ref 10–40)
Alkaline Phosphatase: 60 U/L (ref 40–115)
BUN: 15 mg/dL (ref 7–25)
CO2: 25 mmol/L (ref 20–31)
Calcium: 9.5 mg/dL (ref 8.6–10.3)
Chloride: 102 mmol/L (ref 98–110)
Creat: 1.18 mg/dL (ref 0.60–1.35)
GLUCOSE: 236 mg/dL — AB (ref 65–99)
Potassium: 3.9 mmol/L (ref 3.5–5.3)
SODIUM: 141 mmol/L (ref 135–146)
Total Bilirubin: 0.5 mg/dL (ref 0.2–1.2)
Total Protein: 6.5 g/dL (ref 6.1–8.1)

## 2014-11-16 LAB — LIPID PANEL
CHOL/HDL RATIO: 5.9 ratio — AB (ref <=5.0)
Cholesterol: 213 mg/dL — ABNORMAL HIGH (ref 125–200)
HDL: 36 mg/dL — AB (ref >=40)
Triglycerides: 735 mg/dL — ABNORMAL HIGH (ref <150)

## 2014-11-16 LAB — HIV ANTIBODY (ROUTINE TESTING W REFLEX): HIV 1&2 Ab, 4th Generation: NONREACTIVE

## 2014-11-16 LAB — MICROALBUMIN, URINE: Microalb, Ur: 273.4 mg/dL — ABNORMAL HIGH (ref <2.0)

## 2014-12-21 ENCOUNTER — Ambulatory Visit (INDEPENDENT_AMBULATORY_CARE_PROVIDER_SITE_OTHER): Payer: BLUE CROSS/BLUE SHIELD | Admitting: Family Medicine

## 2014-12-21 VITALS — BP 144/80 | HR 96 | Temp 98.3°F | Resp 16 | Ht 66.0 in | Wt 235.8 lb

## 2014-12-21 DIAGNOSIS — M7021 Olecranon bursitis, right elbow: Secondary | ICD-10-CM | POA: Diagnosis not present

## 2014-12-21 MED ORDER — PREDNISONE 20 MG PO TABS: ORAL_TABLET | ORAL | Status: DC

## 2014-12-21 NOTE — Patient Instructions (Signed)
Olecranon Bursitis Bursitis is swelling and soreness (inflammation) of a fluid-filled sac (bursa) that covers and protects a joint. Olecranon bursitis occurs over the elbow.  CAUSES Bursitis can be caused by injury, overuse of the joint, arthritis, or infection.  SYMPTOMS   Tenderness, swelling, warmth, or redness over the elbow.  Elbow pain with movement. This is greater with bending the elbow.  Squeaking sound when the bursa is rubbed or moved.  Increasing size of the bursa without pain or discomfort.  Fever with increasing pain and swelling if the bursa becomes infected. HOME CARE INSTRUCTIONS   Put ice on the affected area.  Put ice in a plastic bag.  Place a towel between your skin and the bag.  Leave the ice on for 15-20 minutes each hour while awake. Do this for the first 2 days.  When resting, elevate your elbow above the level of your heart. This helps reduce swelling.  Continue to put the joint through a full range of motion 4 times per day. Rest the injured joint at other times. When the pain lessens, begin normal slow movements and usual activities.  Only take over-the-counter or prescription medicines for pain, discomfort, or fever as directed by your caregiver.  Reduce your intake of milk and related dairy products (cheese, yogurt). They may make your condition worse. SEEK IMMEDIATE MEDICAL CARE IF:   Your pain increases even during treatment.  You have a fever.  You have heat and inflammation over the bursa and elbow.  You have a red line that goes up your arm.  You have pain with movement of your elbow. MAKE SURE YOU:   Understand these instructions.  Will watch your condition.  Will get help right away if you are not doing well or get worse. Document Released: 04/30/2006 Document Revised: 06/23/2011 Document Reviewed: 03/16/2007 ExitCare Patient Information 2015 [employer]. This information is not intended to replace advice given to you by your  health care provider. Make sure you discuss any questions you have with your health care provider.  

## 2014-12-21 NOTE — Progress Notes (Addendum)
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 Comes in with complaints of 2 weeks of right elbow pain. He's had no injury, no swelling, no redness, no fever. He says that the pain is intermittent and that it may occur when he straightens his arm or when he puts pressure on it, but this is not consistent. He has no other joint pains. He's not had this problem before.  Patient tried Aleve with no significant improvement.  Past Medical History  Diagnosis Date  . Diabetes mellitus   . CAD (coronary artery disease)   . Hyperlipidemia   . Hypertension     Objective: No acute distress BP 144/80 mmHg  Pulse 96  Temp(Src) 98.3 F (36.8 C) (Oral)  Resp 16  Ht 5\' 6"  (1.676 m)  Wt 235 lb 12.8 oz (106.958 kg)  BMI 38.08 kg/m2  SpO2 98% Right arm shows full range of motion. There is no tenderness of the elbow, no redness or erythema or swelling over the elbow. Palpation reveals no localized tenderness. Patient has normal hands, wrist appearance. Patient is obese He has mild ptosis of right lid which he says is normal for him  Assessment: Intermittent nature of this along with a lack of physical findings suggest this may be a mild bursitis. He's failed NSAID trial so I think using something a little stronger has a better chance of succeeding and abolish the pain.  Plan:    ICD-9-CM ICD-10-CM   1. Olecranon bursitis of right elbow 726.33 M70.21 predniSONE (DELTASONE) 20 MG tablet   patient cautioned to check his blood sugars and if they significantly rise, stop the prednisone and give [place] a call or return   Signed, Robyn Haber, MD

## 2015-01-10 ENCOUNTER — Encounter: Payer: Self-pay | Admitting: Internal Medicine

## 2015-01-10 ENCOUNTER — Ambulatory Visit (INDEPENDENT_AMBULATORY_CARE_PROVIDER_SITE_OTHER): Payer: Self-pay | Admitting: Internal Medicine

## 2015-01-10 VITALS — BP 188/90 | HR 87 | Ht 66.75 in | Wt 244.5 lb

## 2015-01-10 DIAGNOSIS — Z951 Presence of aortocoronary bypass graft: Secondary | ICD-10-CM | POA: Diagnosis not present

## 2015-01-10 DIAGNOSIS — R011 Cardiac murmur, unspecified: Secondary | ICD-10-CM

## 2015-01-10 DIAGNOSIS — I2510 Atherosclerotic heart disease of native coronary artery without angina pectoris: Secondary | ICD-10-CM

## 2015-01-10 DIAGNOSIS — E785 Hyperlipidemia, unspecified: Secondary | ICD-10-CM | POA: Diagnosis not present

## 2015-01-10 DIAGNOSIS — I2583 Coronary atherosclerosis due to lipid rich plaque: Secondary | ICD-10-CM

## 2015-01-10 DIAGNOSIS — I10 Essential (primary) hypertension: Secondary | ICD-10-CM

## 2015-01-10 DIAGNOSIS — IMO0002 Reserved for concepts with insufficient information to code with codable children: Secondary | ICD-10-CM

## 2015-01-10 DIAGNOSIS — E1165 Type 2 diabetes mellitus with hyperglycemia: Secondary | ICD-10-CM

## 2015-01-10 NOTE — Progress Notes (Signed)
OFFICE NOTE  Chief Complaint:  Uncontrolled hypertension, dyslipidemia  Primary Care Physician: JEFFERY,CHELLE, PA-C  HPI:  [name] is a pleasant 39 year old gentleman who I previously followed, however he is not been seen in over 3 years. For some reason he did not make a whole appointment. His past medical history sieving and for CABG 3 vessels in 2010, with LIMA to LAD, SVG to OM1 and SVG to PDA. He also has history of hypertension, poorly controlled type 2 diabetes, dyslipidemia and obesity. He's been noncompliant with medications and continues to have problems taking his medications regularly. In fact he did not take his blood pressure medicine today. In the past he had regularly drank alcohol a recently he says he only uses alcohol a couple days a week. He had recent blood work at his primary care provider's office which showed poorly controlled lipid profile including a very high triglycerides over 700. His hemoglobin A1c is 10.5. Blood pressure is also been poorly controlled and today in the office was elevated at 188/90. He says he did not take his blood pressure medicines this morning. He denies any chest pain or significant worsening shortness of breath. He does not get a lot of physical activity but does continue to work.  PMHx:  Past Medical History  Diagnosis Date  . Diabetes mellitus   . CAD (coronary artery disease)   . Hyperlipidemia   . Hypertension     Past Surgical History  Procedure Laterality Date  . Coronary artery bypass graft      FAMHx:  Family History  Problem Relation Age of Onset  . Hypertension Mother   . Heart disease Father   . Hypertension Father   . Diabetes Father     SOCHx:   reports that he quit smoking about 6 years ago. He has never used smokeless tobacco. He reports that he drinks alcohol. He reports that he does not use illicit drugs.  ALLERGIES:  No Known Allergies  ROS: A comprehensive review of systems was  negative.  HOME MEDS: Current Outpatient Prescriptions  Medication Sig Dispense Refill  . aspirin 325 MG tablet Take 325 mg by mouth daily.    Marland Kitchen atorvastatin (LIPITOR) 20 MG tablet Take 1 tablet by mouth at bedtime. 30 tablet 5  . Blood Pressure Monitoring (BLOOD PRESSURE MONITOR/L CUFF) MISC 1 Device by Does not apply route daily. 1 each 0  . carvedilol (COREG) 12.5 MG tablet Take 1 tablet by mouth 2 (two) times daily.    . cholecalciferol (VITAMIN D) 1000 UNITS tablet Take 1,000 Units by mouth daily.    . Cinnamon 500 MG TABS Take by mouth daily.    . empagliflozin (JARDIANCE) 10 MG TABS tablet Take 10 mg by mouth daily. 90 tablet 3  . fish oil-omega-3 fatty acids 1000 MG capsule Take 2 g by mouth daily.    Marland Kitchen glimepiride (AMARYL) 4 MG tablet Take 1 tablet twice a day before meals. 60 tablet 5  . lisinopril-hydrochlorothiazide (PRINZIDE,ZESTORETIC) 20-12.5 MG per tablet Take 2 tablets by mouth daily. 60 tablet 5  . metFORMIN (GLUCOPHAGE) 1000 MG tablet Take 1 tablet (1,000 mg total) by mouth 2 (two) times daily with a meal. 60 tablet 5  . Multiple Vitamin (MULTIVITAMIN) tablet Take 1 tablet by mouth daily.    . nitroGLYCERIN (NITROSTAT) 0.4 MG SL tablet Place 1 tablet (0.4 mg total) under the tongue every 5 (five) minutes as needed for chest pain. REQUEST FUTURE REFILLS FROM PCP 25 tablet  0  . OVER THE COUNTER MEDICATION daily.    . predniSONE (DELTASONE) 20 MG tablet Two daily with food 10 tablet 0   No current facility-administered medications for this visit.    LABS/IMAGING: No results found for this or any previous visit (from the past 48 hour(s)). No results found.  WEIGHTS: Wt Readings from Last 3 Encounters:  01/10/15 244 lb 8 oz (110.904 kg)  12/21/14 235 lb 12.8 oz (106.958 kg)  11/15/14 241 lb (109.317 kg)    VITALS: BP 188/90 mmHg  Pulse 87  Ht 5' 6.75" (1.695 m)  Wt 244 lb 8 oz (110.904 kg)  BMI 38.60 kg/m2  EXAM: General appearance: alert, no distress and  moderately obese Neck: no carotid bruit, no JVD and thyroid not enlarged, symmetric, no tenderness/mass/nodules Lungs: clear to auscultation bilaterally Heart: regular rate and rhythm, S1, S2 normal, no murmur, click, rub or gallop Abdomen: soft, non-tender; bowel sounds normal; no masses,  no organomegaly Extremities: extremities normal, atraumatic, no cyanosis or edema Pulses: 2+ and symmetric Skin: Skin color, texture, turgor normal. No rashes or lesions Neurologic: Grossly normal Psych: Pleasant  EKG: Normal sinus rhythm at 87  ASSESSMENT: 1. Coronary artery disease status post three-vessel CABG (2010)-LIMA to LAD, SVG to OM1, and SVG to PDA 2. Hypertension-uncontrolled 3. Dyslipidemia 4. Type 2 diabetes, uncontrolled 5. Obesity 6. Medication noncompliance  PLAN: 1.   [name] unfortunately has been noncompliant with medical therapy and follow-up visits. I reminded him that he has multivessel coronary disease and although he's been bypassed requires regular medical care. His bypass grafts may not last as long with poor control diabetes and cholesterol. He needs better blood sugar control which will significantly impact his high triglycerides. In addition, it may benefit from reducing alcohol in his diet. He recently had an increase in his Lipitor from 20-40 mg by his primary care provider. I like to see the result of those changes in a couple months. At this time additional therapy, and potentially a fibroid may be necessary if blood sugar control is improved. With regards to his uncontrolled hypertension, the main issue seems to be noncompliance with medications. I asked him to try to take his medicines more regularly and keep a journal of his blood pressures over the next several weeks. He should then bring that journal along with a blood pressure cuff for follow-up appointment in one month. He does have a history of low normal EF by echo in 2010. I like to repeat that echocardiogram  today as it's been 6 years since his last study.  Plan to see him back in the office in about a month. Thanks again for referring him back for cardiac care.  Pixie Casino, MD, [facility] Attending Cardiologist [place] 01/10/2015, 12:58 PM

## 2015-01-10 NOTE — Patient Instructions (Signed)
Your physician has requested that you have an echocardiogram. Echocardiography is a painless test that uses sound waves to create images of your heart. It provides your doctor with information about the size and shape of your heart and how well your heart's chambers and valves are working. This procedure takes approximately one hour. There are no restrictions for this procedure.  Your physician recommends that you schedule a follow-up appointment in [place] with Dr. Debara Pickett - OK to double book if needed.   Check your blood pressure daily and keep a log of this.  Bring your readings to your next visit.

## 2015-01-18 ENCOUNTER — Other Ambulatory Visit (HOSPITAL_COMMUNITY): Payer: BLUE CROSS/BLUE SHIELD

## 2015-02-08 ENCOUNTER — Ambulatory Visit: Payer: BLUE CROSS/BLUE SHIELD | Admitting: Internal Medicine

## 2015-02-14 ENCOUNTER — Telehealth (HOSPITAL_COMMUNITY): Payer: Self-pay | Admitting: *Deleted

## 2015-02-20 ENCOUNTER — Ambulatory Visit: Payer: Self-pay | Admitting: Physician Assistant

## 2015-02-20 ENCOUNTER — Telehealth: Payer: Self-pay | Admitting: *Deleted

## 2015-02-20 NOTE — Telephone Encounter (Signed)
Pt missed appt. Left message on voicemail to call the office to reschedule appt.

## 2015-03-12 ENCOUNTER — Telehealth (HOSPITAL_COMMUNITY): Payer: Self-pay | Admitting: *Deleted

## 2015-03-25 ENCOUNTER — Emergency Department (HOSPITAL_COMMUNITY): Payer: BLUE CROSS/BLUE SHIELD

## 2015-03-25 ENCOUNTER — Encounter (HOSPITAL_COMMUNITY): Payer: Self-pay | Admitting: *Deleted

## 2015-03-25 ENCOUNTER — Observation Stay (HOSPITAL_COMMUNITY): Payer: BLUE CROSS/BLUE SHIELD

## 2015-03-25 ENCOUNTER — Observation Stay (HOSPITAL_COMMUNITY)
Admission: EM | Admit: 2015-03-25 | Discharge: 2015-03-26 | Disposition: A | Discharge status: Home / Self Care | Payer: BLUE CROSS/BLUE SHIELD | Attending: Family Medicine | Admitting: Family Medicine

## 2015-03-25 DIAGNOSIS — Z6837 Body mass index (BMI) 37.0-37.9, adult: Secondary | ICD-10-CM | POA: Diagnosis not present

## 2015-03-25 DIAGNOSIS — E669 Obesity, unspecified: Secondary | ICD-10-CM | POA: Insufficient documentation

## 2015-03-25 DIAGNOSIS — Z7952 Long term (current) use of systemic steroids: Secondary | ICD-10-CM | POA: Diagnosis not present

## 2015-03-25 DIAGNOSIS — I10 Essential (primary) hypertension: Secondary | ICD-10-CM | POA: Insufficient documentation

## 2015-03-25 DIAGNOSIS — E876 Hypokalemia: Secondary | ICD-10-CM | POA: Diagnosis not present

## 2015-03-25 DIAGNOSIS — Z79899 Other long term (current) drug therapy: Secondary | ICD-10-CM | POA: Insufficient documentation

## 2015-03-25 DIAGNOSIS — Z7982 Long term (current) use of aspirin: Secondary | ICD-10-CM | POA: Diagnosis not present

## 2015-03-25 DIAGNOSIS — Z9114 Patient's other noncompliance with medication regimen: Secondary | ICD-10-CM | POA: Insufficient documentation

## 2015-03-25 DIAGNOSIS — E785 Hyperlipidemia, unspecified: Secondary | ICD-10-CM | POA: Insufficient documentation

## 2015-03-25 DIAGNOSIS — R569 Unspecified convulsions: Secondary | ICD-10-CM

## 2015-03-25 DIAGNOSIS — N179 Acute kidney failure, unspecified: Secondary | ICD-10-CM | POA: Insufficient documentation

## 2015-03-25 DIAGNOSIS — E1165 Type 2 diabetes mellitus with hyperglycemia: Principal | ICD-10-CM | POA: Insufficient documentation

## 2015-03-25 DIAGNOSIS — Z7984 Long term (current) use of oral hypoglycemic drugs: Secondary | ICD-10-CM | POA: Diagnosis not present

## 2015-03-25 DIAGNOSIS — R739 Hyperglycemia, unspecified: Secondary | ICD-10-CM | POA: Diagnosis not present

## 2015-03-25 DIAGNOSIS — I2510 Atherosclerotic heart disease of native coronary artery without angina pectoris: Secondary | ICD-10-CM | POA: Diagnosis not present

## 2015-03-25 DIAGNOSIS — Z951 Presence of aortocoronary bypass graft: Secondary | ICD-10-CM | POA: Diagnosis not present

## 2015-03-25 DIAGNOSIS — E871 Hypo-osmolality and hyponatremia: Secondary | ICD-10-CM | POA: Insufficient documentation

## 2015-03-25 DIAGNOSIS — Z87891 Personal history of nicotine dependence: Secondary | ICD-10-CM | POA: Diagnosis not present

## 2015-03-25 LAB — CBC
HCT: 38 % — ABNORMAL LOW (ref 39.0–52.0)
Hemoglobin: 12.8 g/dL — ABNORMAL LOW (ref 13.0–17.0)
MCH: 27.7 pg (ref 26.0–34.0)
MCHC: 33.7 g/dL (ref 30.0–36.0)
MCV: 82.3 fL (ref 78.0–100.0)
PLATELETS: 275 10*3/uL (ref 150–400)
RBC: 4.62 MIL/uL (ref 4.22–5.81)
RDW: 13.3 % (ref 11.5–15.5)
WBC: 13.9 10*3/uL — ABNORMAL HIGH (ref 4.0–10.5)

## 2015-03-25 LAB — I-STAT ARTERIAL BLOOD GAS, ED
Acid-base deficit: 3 mmol/L — ABNORMAL HIGH (ref 0.0–2.0)
BICARBONATE: 22.2 meq/L (ref 20.0–24.0)
O2 Saturation: 97 %
TCO2: 23 mmol/L (ref 0–100)
pCO2 arterial: 40 mmHg (ref 35.0–45.0)
pH, Arterial: 7.348 — ABNORMAL LOW (ref 7.350–7.450)
pO2, Arterial: 97 mmHg (ref 80.0–100.0)

## 2015-03-25 LAB — CBC WITH DIFFERENTIAL/PLATELET
BASOS ABS: 0 10*3/uL (ref 0.0–0.1)
BASOS PCT: 0 %
Eosinophils Absolute: 0.1 10*3/uL (ref 0.0–0.7)
Eosinophils Relative: 1 %
HEMATOCRIT: 39.9 % (ref 39.0–52.0)
HEMOGLOBIN: 13.3 g/dL (ref 13.0–17.0)
LYMPHS PCT: 20 %
Lymphs Abs: 2.1 10*3/uL (ref 0.7–4.0)
MCH: 27.6 pg (ref 26.0–34.0)
MCHC: 33.3 g/dL (ref 30.0–36.0)
MCV: 82.8 fL (ref 78.0–100.0)
Monocytes Absolute: 0.7 10*3/uL (ref 0.1–1.0)
Monocytes Relative: 6 %
NEUTROS ABS: 7.9 10*3/uL — AB (ref 1.7–7.7)
NEUTROS PCT: 73 %
Platelets: 263 10*3/uL (ref 150–400)
RBC: 4.82 MIL/uL (ref 4.22–5.81)
RDW: 13.1 % (ref 11.5–15.5)
WBC: 10.9 10*3/uL — AB (ref 4.0–10.5)

## 2015-03-25 LAB — URINE MICROSCOPIC-ADD ON: BACTERIA UA: NONE SEEN

## 2015-03-25 LAB — URINALYSIS, ROUTINE W REFLEX MICROSCOPIC
BILIRUBIN URINE: NEGATIVE
KETONES UR: NEGATIVE mg/dL
Leukocytes, UA: NEGATIVE
NITRITE: NEGATIVE
PH: 5 (ref 5.0–8.0)
Protein, ur: 100 mg/dL — AB
SPECIFIC GRAVITY, URINE: 1.027 (ref 1.005–1.030)

## 2015-03-25 LAB — RAPID URINE DRUG SCREEN, HOSP PERFORMED
Amphetamines: NOT DETECTED
BARBITURATES: NOT DETECTED
BENZODIAZEPINES: POSITIVE — AB
COCAINE: NOT DETECTED
OPIATES: NOT DETECTED
TETRAHYDROCANNABINOL: NOT DETECTED

## 2015-03-25 LAB — COMPREHENSIVE METABOLIC PANEL
ALBUMIN: 3.2 g/dL — AB (ref 3.5–5.0)
ALT: 23 U/L (ref 17–63)
AST: 25 U/L (ref 15–41)
Alkaline Phosphatase: 72 U/L (ref 38–126)
Anion gap: 18 — ABNORMAL HIGH (ref 5–15)
BILIRUBIN TOTAL: 0.7 mg/dL (ref 0.3–1.2)
BUN: 13 mg/dL (ref 6–20)
CALCIUM: 9 mg/dL (ref 8.9–10.3)
CHLORIDE: 91 mmol/L — AB (ref 101–111)
CO2: 21 mmol/L — ABNORMAL LOW (ref 22–32)
Creatinine, Ser: 1.69 mg/dL — ABNORMAL HIGH (ref 0.61–1.24)
GFR calc Af Amer: 57 mL/min — ABNORMAL LOW (ref >60)
GFR calc non Af Amer: 49 mL/min — ABNORMAL LOW (ref >60)
GLUCOSE: 695 mg/dL — AB (ref 65–99)
POTASSIUM: 4.1 mmol/L (ref 3.5–5.1)
Sodium: 130 mmol/L — ABNORMAL LOW (ref 135–145)
TOTAL PROTEIN: 6.1 g/dL — AB (ref 6.5–8.1)

## 2015-03-25 LAB — MRSA PCR SCREENING: MRSA by PCR: NEGATIVE

## 2015-03-25 LAB — CREATININE, SERUM
CREATININE: 1.26 mg/dL — AB (ref 0.61–1.24)
GFR calc Af Amer: >60 mL/min (ref >60)
GFR calc non Af Amer: >60 mL/min (ref >60)

## 2015-03-25 LAB — MAGNESIUM: Magnesium: 2.5 mg/dL — ABNORMAL HIGH (ref 1.7–2.4)

## 2015-03-25 LAB — CBG MONITORING, ED
Glucose-Capillary: >600 mg/dL (ref 65–99)
Glucose-Capillary: 391 mg/dL — ABNORMAL HIGH (ref 65–99)
Glucose-Capillary: 391 mg/dL — ABNORMAL HIGH (ref 65–99)

## 2015-03-25 LAB — GLUCOSE, CAPILLARY
GLUCOSE-CAPILLARY: 371 mg/dL — AB (ref 65–99)
GLUCOSE-CAPILLARY: 257 mg/dL — AB (ref 65–99)

## 2015-03-25 LAB — ETHANOL: Alcohol, Ethyl (B): <5 mg/dL (ref <5)

## 2015-03-25 LAB — TSH: TSH: 1.532 u[IU]/mL (ref 0.350–4.500)

## 2015-03-25 LAB — PHOSPHORUS: Phosphorus: 2.5 mg/dL (ref 2.5–4.6)

## 2015-03-25 MED ORDER — CARVEDILOL 12.5 MG PO TABS
12.5000 mg | ORAL_TABLET | Freq: Two times a day (BID) | ORAL | Status: DC
  Administered 2015-03-25 – 2015-03-26 (×4): 12.5 mg via ORAL
  Filled 2015-03-25 (×4): qty 1

## 2015-03-25 MED ORDER — SODIUM CHLORIDE 0.9 % IV SOLN
1000.0000 mL | INTRAVENOUS | Status: DC
  Administered 2015-03-25: 1000 mL via INTRAVENOUS

## 2015-03-25 MED ORDER — INSULIN ASPART 100 UNIT/ML ~~LOC~~ SOLN
0.0000 [IU] | SUBCUTANEOUS | Status: DC
  Administered 2015-03-25 (×2): 9 [IU] via SUBCUTANEOUS
  Administered 2015-03-25: 5 [IU] via SUBCUTANEOUS
  Administered 2015-03-26: 2 [IU] via SUBCUTANEOUS
  Administered 2015-03-26: 5 [IU] via SUBCUTANEOUS
  Administered 2015-03-26: 6 [IU] via SUBCUTANEOUS
  Administered 2015-03-26: 7 [IU] via SUBCUTANEOUS
  Administered 2015-03-26: 5 [IU] via SUBCUTANEOUS

## 2015-03-25 MED ORDER — INSULIN ASPART 100 UNIT/ML ~~LOC~~ SOLN
10.0000 [IU] | Freq: Once | SUBCUTANEOUS | Status: AC
  Administered 2015-03-25: 10 [IU] via INTRAVENOUS
  Filled 2015-03-25: qty 1

## 2015-03-25 MED ORDER — SODIUM CHLORIDE 0.9 % IV SOLN
1000.0000 mL | Freq: Once | INTRAVENOUS | Status: AC
  Administered 2015-03-25: 1000 mL via INTRAVENOUS

## 2015-03-25 MED ORDER — ASPIRIN 325 MG PO TABS
325.0000 mg | ORAL_TABLET | Freq: Every day | ORAL | Status: DC
  Administered 2015-03-25 – 2015-03-26 (×2): 325 mg via ORAL
  Filled 2015-03-25 (×2): qty 1

## 2015-03-25 MED ORDER — PANTOPRAZOLE SODIUM 40 MG PO TBEC
40.0000 mg | DELAYED_RELEASE_TABLET | Freq: Every day | ORAL | Status: DC
  Administered 2015-03-25 – 2015-03-26 (×2): 40 mg via ORAL
  Filled 2015-03-25 (×2): qty 1

## 2015-03-25 MED ORDER — ATORVASTATIN CALCIUM 20 MG PO TABS
20.0000 mg | ORAL_TABLET | Freq: Every day | ORAL | Status: DC
  Administered 2015-03-25 – 2015-03-26 (×2): 20 mg via ORAL
  Filled 2015-03-25 (×2): qty 1

## 2015-03-25 MED ORDER — SODIUM CHLORIDE 0.9 % IV SOLN
INTRAVENOUS | Status: AC
  Administered 2015-03-25: 12:00:00 via INTRAVENOUS

## 2015-03-25 MED ORDER — ACETAMINOPHEN 650 MG RE SUPP: 650.0000 mg | Freq: Four times a day (QID) | RECTAL | Status: DC | PRN

## 2015-03-25 MED ORDER — HEPARIN SODIUM (PORCINE) 5000 UNIT/ML IJ SOLN
5000.0000 [IU] | Freq: Three times a day (TID) | INTRAMUSCULAR | Status: DC
  Administered 2015-03-25 – 2015-03-26 (×3): 5000 [IU] via SUBCUTANEOUS
  Filled 2015-03-25 (×4): qty 1

## 2015-03-25 MED ORDER — HYDROCHLOROTHIAZIDE 12.5 MG PO CAPS
12.5000 mg | ORAL_CAPSULE | Freq: Every day | ORAL | Status: DC
Start: 2015-03-25 — End: 2015-03-26
  Administered 2015-03-25 – 2015-03-26 (×2): 12.5 mg via ORAL
  Filled 2015-03-25 (×2): qty 1

## 2015-03-25 MED ORDER — ACETAMINOPHEN 325 MG PO TABS: 650.0000 mg | ORAL_TABLET | Freq: Four times a day (QID) | ORAL | Status: DC | PRN

## 2015-03-25 MED ORDER — INSULIN GLARGINE 100 UNIT/ML ~~LOC~~ SOLN
10.0000 [IU] | Freq: Every day | SUBCUTANEOUS | Status: DC
  Administered 2015-03-25 – 2015-03-26 (×2): 10 [IU] via SUBCUTANEOUS
  Filled 2015-03-25 (×2): qty 0.1

## 2015-03-25 MED ORDER — NYSTATIN 100000 UNIT/ML MT SUSP
5.0000 mL | Freq: Four times a day (QID) | OROMUCOSAL | Status: DC
  Administered 2015-03-25 – 2015-03-26 (×3): 500000 [IU] via ORAL
  Filled 2015-03-25 (×3): qty 5

## 2015-03-25 MED ORDER — INSULIN ASPART 100 UNIT/ML ~~LOC~~ SOLN
5.0000 [IU] | Freq: Once | SUBCUTANEOUS | Status: AC
  Administered 2015-03-25: 5 [IU] via INTRAVENOUS
  Filled 2015-03-25: qty 1

## 2015-03-25 NOTE — H&P (Signed)
[facility] Admission History and Physical Service Pager: [phone]  Patient name: [name] Medical record number: [id number] Date of birth: 11/28/1975 Age: 39 y.o. Gender: male  Primary Care Provider: JEFFERY,CHELLE, PA-C Consultants: Neuro Code Status: Full  Chief Complaint: Seizures  Assessment and Plan: [name] is a 39 y.o. male presenting with seizure-like activity x2 and hyperglycemia . PMH is significant for  significant for T2DM, HTN, HLD, CAD s/p CABG x3.   #Seizure-like activity: Patient presented with 2 witnessed seizure episodes. Patient does not have a history of seizure disorder. CT head within normal limits.  On presentation patient had hyperglycemia. Elevated sugars most likely cause of seizures. No signs of other electrolyte abnormalities(normal Mg and Ca) causing seizure, UDS and ethanol level normal (UDS taken after benzo with EMS) so do not believe this is from toxicity or withdrawal. ECG without signs of arrhythmia; some enlargement.  -Admit to stepdown for observation under Dr. Ardelia Mems -Neuro consulted  -Order placed for EEG tomorrow morning -neuro checks q4hrs -seizure precautions -repeat labs in AM - will add TSH to rule out as cause of seizure  #Hyperglycemia in T2DM: On initial presentation blood sugar 695. Patient given NovoLog 15 units in the ED. He was noted to have metabolic acidosis: AG 18, CO2 21. ABG 7.35/40/97/22.2. Do not believe he is in DKALast A1c obtained August 2016 was 10.5. Patient is noncompliant with medications and last took diabetes medications about 5 days ago. -Recheck A1c -Every 4 CBG checks with sliding scale insulin -holding home metformin, Amaryl, Jardiance -diabetes education/coordinator -Will likely go home on insulin  #HTN: Blood pressures elevated on admission. Highest 181/97. - Continue home Coreg and HCTZ; holding lisinopril in setting of AKI  #AKI. Prior renal function test showing  mild kidney dysfunction. 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 Creatinine on admission 1.69; baseline appears to be 1.1. Labs most consistent with intrinsic etiology of AKI. Most likely 2/2 seizure episodes. Protein appreciated on UA. -hydrate with NS -monitor I/O -avoid nephrotoxic medications -repeat labs in AM  #Hyponatremia: On admission 130. No past medical history of hyponatremia. -will continue to monitor -repeat in AM -received NaCl bolus and on mIVF  #HLD: Last lipid panel 11/2014 with elevated total cholesterol and triglycerides. -continue home Lipitor 20 at bedtime  FEN/GI: HH/carb-modified diet, NS @125 , PPI Prophylaxis: Hep Subq  Disposition: Admit to stepdown for observation.   History of Present Illness:  [name] is a 39 y.o. male presenting with seizure-like activity. Patient is unable to recall events of seizure. However girlfriend in room who witnessed his seizure is able to give history. She states that around 5 AM this morning patient started shaking/convulsing and fell on the floor. She states he was foaming at the mouth and his fists were clenched. He did have loss of bowels during this time. Event lasted less than 1 minute. Afterwards he had heavy breathing and was unresponsive. She proceeded to call 911. Upon EMS arrival patient appeared post ictal. During transportation to hospital patient had another seizure episode in route. EMS gave patient midazolam 2.5 mg IV. CBG was also checked and was high along with elevated blood pressures.  Patient states that he was well prior to these episodes. He has never had seizures before. Patient does have history of diabetes and high blood pressure. He states that he is noncompliant with his medications. Last took his diabetes medications on Tuesday. Patient denies any pain at this time. He states that he  still feels a little foggy otherwise normal. He just remembers waking up in the hospital.  Review Of Systems: Per HPI with  the following additions: no CP, SOB, nausea, vomiting, vision changes, headache, abdominal pain. Otherwise the remainder of the systems were negative.  Patient Active Problem List   Diagnosis Date Noted  . 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 Diabetes type 2, uncontrolled ([place]) 04/21/2013  . Blurred vision 11/20/2011  . Obesity, Class II, BMI 35-39.9, with comorbidity ([place]) 11/19/2011  . Hypertension 05/11/2011  . Hyperlipidemia 05/11/2011  . CAD (coronary artery disease) 05/11/2011    Past Medical History: Past Medical History  Diagnosis Date  . Diabetes mellitus   . CAD (coronary artery disease)   . Hyperlipidemia   . Hypertension     Past Surgical History: Past Surgical History  Procedure Laterality Date  . Coronary artery bypass graft      Social History: Social History  Substance Use Topics  . Smoking status: Former Smoker    Quit date: 09/17/2008  . Smokeless tobacco: Never Used  . Alcohol Use: Yes     Comment: drink beer occas. (4-5 a week)   Additional social history: Lives alone  Please also refer to relevant sections of EMR.  Family History: Family History  Problem Relation Age of Onset  . Hypertension Mother   . Heart disease Father   . Hypertension Father   . Diabetes Father   . Hyperlipidemia Father   No FH of seizure disorder  Allergies and Medications: No Known Allergies No current facility-administered medications on file prior to encounter.   Current Outpatient Prescriptions on File Prior to Encounter  Medication Sig Dispense Refill  . aspirin 325 MG tablet Take 325 mg by mouth daily.    Marland Kitchen atorvastatin (LIPITOR) 20 MG tablet Take 1 tablet by mouth at bedtime. 30 tablet 5  . carvedilol (COREG) 12.5 MG tablet Take 1 tablet by mouth 2 (two) times daily.    . cholecalciferol (VITAMIN D) 1000 UNITS tablet Take 1,000 Units by mouth daily.    . empagliflozin (JARDIANCE) 10 MG TABS tablet Take 10 mg by mouth daily. 90 tablet 3  . fish oil-omega-3 fatty  acids 1000 MG capsule Take 2 g by mouth daily.    Marland Kitchen glimepiride (AMARYL) 4 MG tablet Take 1 tablet twice a day before meals. 60 tablet 5  . lisinopril-hydrochlorothiazide (PRINZIDE,ZESTORETIC) 20-12.5 MG per tablet Take 2 tablets by mouth daily. 60 tablet 5  . metFORMIN (GLUCOPHAGE) 1000 MG tablet Take 1 tablet (1,000 mg total) by mouth 2 (two) times daily with a meal. 60 tablet 5  . Multiple Vitamin (MULTIVITAMIN) tablet Take 1 tablet by mouth daily.    . nitroGLYCERIN (NITROSTAT) 0.4 MG SL tablet Place 1 tablet (0.4 mg total) under the tongue every 5 (five) minutes as needed for chest pain. REQUEST FUTURE REFILLS FROM PCP 25 tablet 0    Objective: BP 179/97 mmHg  Pulse 102  Temp(Src) 97.4 F (36.3 C) (Oral)  Resp 10  Wt 250 lb (113.399 kg)  SpO2 99% Exam: General: alert, awake, well-developed, NAD, cooperative HEET: NCAT, vision grossly intact, PERRLA, no injection and anicteric. EOMI. MMM, oral mucosa and oropharynx reveal no lesions or exudates. External ear exam reveals no significant lesions or deformities. Hearing is grossly normal bilaterally. Neck: supple, full ROM, no thyromegaly.  Lungs: CTAB, normal respiratory effort, no crackles, and no wheezes. [place] in place. Heart: RRR, no M/R/G. Chest is nontender. Vertical scar down chest wall. Abdomen:  Bowel sounds normal; abdomen soft and nontender. No masses, organomegaly or hernias noted. no guarding, no rebound tenderness Pulses: DP/PT are full and equal bilaterally. Extremities: No cyanosis, clubbing, edema Neurologic: No focal deficits, CN II-XII intact, +5 strength globally, sensation grossly intact, A&Ox4. Deep tendon reflexes symmetrical and normal. Skin: Intact without suspicious lesions or rashes. Warm and dry. Psych: Mood and affect are normal; no evidence of anxiety or depression.  Labs and Imaging: Results for orders placed or performed during the hospital encounter of 03/25/15 (from the past 24 hour(s))  CBG monitoring,  ED     Status: Abnormal   Collection Time: 03/25/15  5:55 AM  Result Value Ref Range   Glucose-Capillary >600 (HH) 65 - 99 mg/dL   Comment 1 Notify RN    Comment 2 Document in Chart   Comprehensive metabolic panel     Status: Abnormal   Collection Time: 03/25/15  6:34 AM  Result Value Ref Range   Sodium 130 (L) 135 - 145 mmol/L   Potassium 4.1 3.5 - 5.1 mmol/L   Chloride 91 (L) 101 - 111 mmol/L   CO2 21 (L) 22 - 32 mmol/L   Glucose, Bld 695 (HH) 65 - 99 mg/dL   BUN 13 6 - 20 mg/dL   Creatinine, Ser 1.69 (H) 0.61 - 1.24 mg/dL   Calcium 9.0 8.9 - 10.3 mg/dL   Total Protein 6.1 (L) 6.5 - 8.1 g/dL   Albumin 3.2 (L) 3.5 - 5.0 g/dL   AST 25 15 - 41 U/L   ALT 23 17 - 63 U/L   Alkaline Phosphatase 72 38 - 126 U/L   Total Bilirubin 0.7 0.3 - 1.2 mg/dL   GFR calc non Af Amer 49 (L) >60 mL/min   GFR calc Af Amer 57 (L) >60 mL/min   Anion gap 18 (H) 5 - 15  CBC with Differential     Status: Abnormal   Collection Time: 03/25/15  6:34 AM  Result Value Ref Range   WBC 10.9 (H) 4.0 - 10.5 K/uL   RBC 4.82 4.22 - 5.81 MIL/uL   Hemoglobin 13.3 13.0 - 17.0 g/dL   HCT 39.9 39.0 - 52.0 %   MCV 82.8 78.0 - 100.0 fL   MCH 27.6 26.0 - 34.0 pg   MCHC 33.3 30.0 - 36.0 g/dL   RDW 13.1 11.5 - 15.5 %   Platelets 263 150 - 400 K/uL   Neutrophils Relative % 73 %   Neutro Abs 7.9 (H) 1.7 - 7.7 K/uL   Lymphocytes Relative 20 %   Lymphs Abs 2.1 0.7 - 4.0 K/uL   Monocytes Relative 6 %   Monocytes Absolute 0.7 0.1 - 1.0 K/uL   Eosinophils Relative 1 %   Eosinophils Absolute 0.1 0.0 - 0.7 K/uL   Basophils Relative 0 %   Basophils Absolute 0.0 0.0 - 0.1 K/uL  Ethanol     Status: None   Collection Time: 03/25/15  6:34 AM  Result Value Ref Range   Alcohol, Ethyl (B) <5 <5 mg/dL  Magnesium     Status: Abnormal   Collection Time: 03/25/15  6:34 AM  Result Value Ref Range   Magnesium 2.5 (H) 1.7 - 2.4 mg/dL  I-Stat arterial blood gas, ED     Status: Abnormal   Collection Time: 03/25/15  6:42 AM  Result  Value Ref Range   pH, Arterial 7.348 (L) 7.350 - 7.450   pCO2 arterial 40.0 35.0 - 45.0 mmHg   pO2, Arterial 97.0 80.0 -  100.0 mmHg   Bicarbonate 22.2 20.0 - 24.0 mEq/L   TCO2 23 0 - 100 mmol/L   O2 Saturation 97.0 %   Acid-base deficit 3.0 (H) 0.0 - 2.0 mmol/L   Patient temperature 97.4 F    Collection site RADIAL, ALLEN'S TEST ACCEPTABLE    Drawn by RT    Sample type ARTERIAL   Urine rapid drug screen (hosp performed)     Status: Abnormal   Collection Time: 03/25/15  6:43 AM  Result Value Ref Range   Opiates NONE DETECTED NONE DETECTED   Cocaine NONE DETECTED NONE DETECTED   Benzodiazepines POSITIVE (A) NONE DETECTED   Amphetamines NONE DETECTED NONE DETECTED   Tetrahydrocannabinol NONE DETECTED NONE DETECTED   Barbiturates NONE DETECTED NONE DETECTED  CBG monitoring, ED     Status: Abnormal   Collection Time: 03/25/15  8:11 AM  Result Value Ref Range   Glucose-Capillary 391 (H) 65 - 99 mg/dL   Ct Head Wo Contrast  03/25/2015  CLINICAL DATA:  Seizure earlier today EXAM: CT HEAD WITHOUT CONTRAST TECHNIQUE: Contiguous axial images were obtained from the base of the skull through the vertex without intravenous contrast. COMPARISON:  None. FINDINGS: The ventricles are upper normal in size and normal in configuration. The sulci appear normal. There is a small cavum septum pellucidum, an anatomic variant. There is no intracranial mass, hemorrhage, extra-axial fluid collection, or midline shift. Gray and white compartments appear normal. No acute infarct evident. The bony calvarium appears intact. The visualized mastoid air cells are clear. No intraorbital lesions are identified. IMPRESSION: Study within normal limits. Electronically Signed   By: Lowella Grip III M.D.   On: 03/25/2015 07:18    Katheren Shams, DO 03/25/2015, 8:07 AM PGY-2, [facility] pager: [phone], text pages welcome

## 2015-03-25 NOTE — ED Notes (Signed)
Drinking ice water-- family at bedside.

## 2015-03-25 NOTE — Consult Note (Signed)
Neurology Consultation Reason for Consult: Seizure Referring Physician: Ardelia Mems, B  CC: Seizure  History is obtained from: Patient  HPI: [name] is a 39 y.o. male with a history of diabetes, hyperlipidemia, hypertension who presents with extremely elevated blood glucose and 2 seizures. His girlfriend apparently stated that he began shaking. He lost consciousness and has no recollection of the events. That he remembers is awakening in the emergency room. The second event was witnessed by EMS. He has no memory of the time during this. He did have incontinence.   ROS: A 14 point ROS was performed and is negative except as noted in the HPI.   Past Medical History  Diagnosis Date  . Diabetes mellitus   . CAD (coronary artery disease)   . Hyperlipidemia   . Hypertension      Family History  Problem Relation Age of Onset  . Hypertension Mother   . Heart disease Father   . Hypertension Father   . Diabetes Father   . Hyperlipidemia Father      Social History:  reports that he quit smoking about 6 years ago. He has never used smokeless tobacco. He reports that he drinks alcohol. He reports that he does not use illicit drugs.   Exam: Current vital signs: BP 176/75 mmHg  Pulse 103  Temp(Src) 97.4 F (36.3 C) (Oral)  Resp 18  Wt 113.399 kg (250 lb)  SpO2 99% Vital signs in last 24 hours: Temp:  [97.4 F (36.3 C)] 97.4 F (36.3 C) (12/11 0606) Pulse Rate:  [99-114] 103 (12/11 1044) Resp:  [10-28] 18 (12/11 1044) BP: (129-181)/(75-97) 176/75 mmHg (12/11 1044) SpO2:  [96 %-100 %] 99 % (12/11 1044) Weight:  [113.399 kg (250 lb)] 113.399 kg (250 lb) (12/11 0606)  Physical Exam  Constitutional: Appears well-developed and well-nourished.  Psych: Affect appropriate to situation Eyes: No scleral injection HENT: No OP obstrucion Head: Normocephalic.  Cardiovascular: Normal rate and regular rhythm.  Respiratory: Effort normal and breath sounds normal to anterior  ascultation GI: Soft.  No distension. There is no tenderness.  Skin: WDI  Neuro: Mental Status: Patient is awake, alert, oriented to person, place, month, year, and situation. Patient is able to give a clear and coherent history. No signs of aphasia or neglect Cranial Nerves: II: Visual Fields are full. Pupils are equal, round, and reactive to light.   III,IV, VI: EOMI without ptosis or diploplia.  V: Facial sensation is symmetric to temperature VII: Facial movement is symmetric.  VIII: hearing is intact to voice X: Uvula elevates symmetrically XI: Shoulder shrug is symmetric. XII: tongue is midline without atrophy or fasciculations.  Motor: Tone is normal. Bulk is normal. 5/5 strength was present in all four extremities.  Sensory: Sensation is symmetric to light touch and temperature in the arms and legs. Deep Tendon Reflexes: 2+ and symmetric in the biceps and patellae.  Plantars: Toes are downgoing bilaterally.  Cerebellar: FNF intact         I have reviewed labs in epic and the results pertinent to this consultation are: Elevated glucose in the 700s Mildly low sodium elevated creatinine    I have reviewed the images obtained:CT head is negative   Impression: 39 year old male with new onset seizures in the setting of severe hyperglycemia. Profound hyperglycemia can be a cause of seizures. I would not favor starting antiepileptic therapy unless other an MRI or EEG are suggestive of a seizure predisposition.  Recommendations: 1) MRI brain 2) EEG 3) neurology will  follow  Roland Rack, MD [facility] [phone]  If 7pm- 7am, please page neurology on call as listed in [place].

## 2015-03-25 NOTE — ED Notes (Signed)
Patient presents via EMS.  EMS reports they were called for seizure.  Girlfriend stated he had a seizure that lasted about 30-45 seconds.  Upon their arrival he was having snoring resp but would wake up with sternal rub.  Stated he was in a postictal state for approx 5-10 mins.  After being transported to the truck he had a tonic clonic seizure that lasted approx 30-45 seconds.  They adm 2.5 mg Versed IM, CBG was high, BP 198/100 manually.  Has not been taking his meds recently.

## 2015-03-25 NOTE — ED Provider Notes (Signed)
CSN: [id number]     Arrival date & time 03/25/15  0551 History   First MD Initiated Contact with Patient 03/25/15 [phone]     Chief Complaint  Patient presents with  . Seizures     (Consider location/radiation/quality/duration/timing/severity/associated sxs/prior Treatment) Patient is a 39 y.o. male presenting with seizures. The history is provided by the EMS personnel. The history is limited by the condition of the patient (Altered mental status).  Seizures He was brought in by ambulance after having had a seizure at home and then having had another seizure in the ambulance. He was given midazolam 2.5 mg intramuscularly. CBG read high. He was noted to be hypertensive in the ambulance. He does have history of hypertension and diabetes and has been noncompliant with medications. He does not have any known history of seizures.  Past Medical History  Diagnosis Date  . Diabetes mellitus   . CAD (coronary artery disease)   . Hyperlipidemia   . Hypertension    Past Surgical History  Procedure Laterality Date  . Coronary artery bypass graft     Family History  Problem Relation Age of Onset  . Hypertension Mother   . Heart disease Father   . Hypertension Father   . Diabetes Father   . Hyperlipidemia Father    Social History  Substance Use Topics  . Smoking status: Former Smoker    Quit date: 09/17/2008  . Smokeless tobacco: Never Used  . Alcohol Use: Yes     Comment: drink beer occas. (4-5 a week)    Review of Systems  Unable to perform ROS: Mental status change  Neurological: Positive for seizures.      Allergies  Review of patient's allergies indicates no known allergies.  Home Medications   Prior to Admission medications   Medication Sig Start Date End Date Taking? Authorizing Provider  aspirin 325 MG tablet Take 325 mg by mouth daily.    Hayden Rasmussen, MD  atorvastatin (LIPITOR) 20 MG tablet Take 1 tablet by mouth at bedtime. 08/10/14   Barton Fanny, MD   Blood Pressure Monitoring (BLOOD PRESSURE MONITOR/L CUFF) MISC 1 Device by Does not apply route daily. 08/10/14   Barton Fanny, MD  carvedilol (COREG) 12.5 MG tablet Take 1 tablet by mouth 2 (two) times daily. 01/06/15   Historical Provider, MD  cholecalciferol (VITAMIN D) 1000 UNITS tablet Take 1,000 Units by mouth daily.    Historical Provider, MD  Cinnamon 500 MG TABS Take by mouth daily.    Historical Provider, MD  empagliflozin (JARDIANCE) 10 MG TABS tablet Take 10 mg by mouth daily. 11/15/14   Chelle Jeffery, PA-C  fish oil-omega-3 fatty acids 1000 MG capsule Take 2 g by mouth daily.    Historical Provider, MD  glimepiride (AMARYL) 4 MG tablet Take 1 tablet twice a day before meals. 08/10/14   Barton Fanny, MD  lisinopril-hydrochlorothiazide (PRINZIDE,ZESTORETIC) 20-12.5 MG per tablet Take 2 tablets by mouth daily. 08/10/14   Barton Fanny, MD  metFORMIN (GLUCOPHAGE) 1000 MG tablet Take 1 tablet (1,000 mg total) by mouth 2 (two) times daily with a meal. 08/10/14   Barton Fanny, MD  Multiple Vitamin (MULTIVITAMIN) tablet Take 1 tablet by mouth daily.    Historical Provider, MD  nitroGLYCERIN (NITROSTAT) 0.4 MG SL tablet Place 1 tablet (0.4 mg total) under the tongue every 5 (five) minutes as needed for chest pain. REQUEST FUTURE REFILLS FROM PCP 09/13/14   Pixie Casino, MD  OVER THE  COUNTER MEDICATION daily.    Historical Provider, MD  predniSONE (DELTASONE) 20 MG tablet Two daily with food 12/21/14   Robyn Haber, MD   BP 153/88 mmHg  Pulse 112  Temp(Src) 97.4 F (36.3 C) (Oral)  Resp 28  Wt 250 lb (113.399 kg)  SpO2 96% Physical Exam  Nursing note and vitals reviewed.  39 year old male, resting comfortably and in no acute distress. Vital signs are significant for tachycardia, tachypnea, hypertension. Oxygen saturation is 96%, which is normal. Head is normocephalic and atraumatic. PERRLA, EOMI. Oropharynx is clear. Fundi show no hemorrhage, exudate, or  papilledema. Neck is nontender and supple without adenopathy or JVD. Back is nontender and there is no CVA tenderness. Lungs are clear without rales, wheezes, or rhonchi. Chest is nontender. Heart has regular rate and rhythm without murmur. Abdomen is soft, flat, nontender without masses or hepatosplenomegaly and peristalsis is normoactive. Extremities have no cyanosis or edema, full range of motion is present. Skin is warm and dry without rash. Neurologic: He is somnolent and minimally arousable. He does open his eyes but does not follow commands and is nonverbal. Cranial nerves are intact, there are no gross motor or sensory deficits.  ED Course  Procedures (including critical care time) Labs Review Results for orders placed or performed during the hospital encounter of 03/25/15  Comprehensive metabolic panel  Result Value Ref Range   Sodium 130 (L) 135 - 145 mmol/L   Potassium 4.1 3.5 - 5.1 mmol/L   Chloride 91 (L) 101 - 111 mmol/L   CO2 21 (L) 22 - 32 mmol/L   Glucose, Bld 695 (HH) 65 - 99 mg/dL   BUN 13 6 - 20 mg/dL   Creatinine, Ser 1.69 (H) 0.61 - 1.24 mg/dL   Calcium 9.0 8.9 - 10.3 mg/dL   Total Protein 6.1 (L) 6.5 - 8.1 g/dL   Albumin 3.2 (L) 3.5 - 5.0 g/dL   AST 25 15 - 41 U/L   ALT 23 17 - 63 U/L   Alkaline Phosphatase 72 38 - 126 U/L   Total Bilirubin 0.7 0.3 - 1.2 mg/dL   GFR calc non Af Amer 49 (L) >60 mL/min   GFR calc Af Amer 57 (L) >60 mL/min   Anion gap 18 (H) 5 - 15  CBC with Differential  Result Value Ref Range   WBC 10.9 (H) 4.0 - 10.5 K/uL   RBC 4.82 4.22 - 5.81 MIL/uL   Hemoglobin 13.3 13.0 - 17.0 g/dL   HCT 39.9 39.0 - 52.0 %   MCV 82.8 78.0 - 100.0 fL   MCH 27.6 26.0 - 34.0 pg   MCHC 33.3 30.0 - 36.0 g/dL   RDW 13.1 11.5 - 15.5 %   Platelets 263 150 - 400 K/uL   Neutrophils Relative % 73 %   Neutro Abs 7.9 (H) 1.7 - 7.7 K/uL   Lymphocytes Relative 20 %   Lymphs Abs 2.1 0.7 - 4.0 K/uL   Monocytes Relative 6 %   Monocytes Absolute 0.7 0.1 - 1.0  K/uL   Eosinophils Relative 1 %   Eosinophils Absolute 0.1 0.0 - 0.7 K/uL   Basophils Relative 0 %   Basophils Absolute 0.0 0.0 - 0.1 K/uL  Ethanol  Result Value Ref Range   Alcohol, Ethyl (B) <5 <5 mg/dL  Urine rapid drug screen (hosp performed)  Result Value Ref Range   Opiates NONE DETECTED NONE DETECTED   Cocaine NONE DETECTED NONE DETECTED   Benzodiazepines POSITIVE (A) NONE DETECTED  Amphetamines NONE DETECTED NONE DETECTED   Tetrahydrocannabinol NONE DETECTED NONE DETECTED   Barbiturates NONE DETECTED NONE DETECTED  CBG monitoring, ED  Result Value Ref Range   Glucose-Capillary >600 (HH) 65 - 99 mg/dL   Comment 1 Notify RN    Comment 2 Document in Chart   I-Stat arterial blood gas, ED  Result Value Ref Range   pH, Arterial 7.348 (L) 7.350 - 7.450   pCO2 arterial 40.0 35.0 - 45.0 mmHg   pO2, Arterial 97.0 80.0 - 100.0 mmHg   Bicarbonate 22.2 20.0 - 24.0 mEq/L   TCO2 23 0 - 100 mmol/L   O2 Saturation 97.0 %   Acid-base deficit 3.0 (H) 0.0 - 2.0 mmol/L   Patient temperature 97.4 F    Collection site RADIAL, ALLEN'S TEST ACCEPTABLE    Drawn by RT    Sample type ARTERIAL   CBG monitoring, ED  Result Value Ref Range   Glucose-Capillary 391 (H) 65 - 99 mg/dL   Imaging Review Ct Head Wo Contrast  03/25/2015  CLINICAL DATA:  Seizure earlier today EXAM: CT HEAD WITHOUT CONTRAST TECHNIQUE: Contiguous axial images were obtained from the base of the skull through the vertex without intravenous contrast. COMPARISON:  None. FINDINGS: The ventricles are upper normal in size and normal in configuration. The sulci appear normal. There is a small cavum septum pellucidum, an anatomic variant. There is no intracranial mass, hemorrhage, extra-axial fluid collection, or midline shift. Gray and white compartments appear normal. No acute infarct evident. The bony calvarium appears intact. The visualized mastoid air cells are clear. No intraorbital lesions are identified. IMPRESSION: Study  within normal limits. Electronically Signed   By: Lowella Grip III M.D.   On: 03/25/2015 07:18   I have personally reviewed and evaluated these images and lab results as part of my medical decision-making.   EKG Interpretation   Date/Time:  Sunday March 25 2015 05:56:40 EST Ventricular Rate:  110 PR Interval:  167 QRS Duration: 96 QT Interval:  349 QTC Calculation: 472 R Axis:   93 Text Interpretation:  Sinus tachycardia Left atrial enlargement Borderline  right axis deviation Left ventricular hypertrophy When compared with ECG  of 12/19/2008, Axis has shifted rightward Confirmed by [facility]  MD, Quadre Bristol  ([id number]) on 03/25/2015 6:05:20 AM      CRITICAL CARE Performed by: [id number] Total critical care time: 50 minutes Critical care time was exclusive of separately billable procedures and treating other patients. Critical care was necessary to treat or prevent imminent or life-threatening deterioration. Critical care was time spent personally by me on the following activities: development of treatment plan with patient and/or surrogate as well as nursing, discussions with consultants, evaluation of patient's response to treatment, examination of patient, obtaining history from patient or surrogate, ordering and performing treatments and interventions, ordering and review of laboratory studies, ordering and review of radiographic studies, pulse oximetry and re-evaluation of patient's condition.  MDM   Final diagnoses:  Seizure ([place])  Hyperglycemia    New onset seizure in patient with uncontrolled diabetes. CBG reads high, but I do not know how high his sugar actually is. He started on treatment for hyperglycemia with IV fluids and IV insulin. Workup for new onset seizure is initiated including CT scan of head and laboratory testing.  Patient was observed in the emergency department and has woken up significantly. He is alert, conversant, but not quite back to baseline per family  have now arrived. Blood glucose was 695 and is combative 375  following fluids and IV insulin. CO2 is slightly low at 21 with slightly elevated anion gap, but arterial blood gases not show significant acidosis. I suspect the acidosis was related to the seizure. CT of head was unremarkable. Because of combination of new onset seizure and severe hyperglycemia, I felt the patient needed to be admitted. His altered mentation on arrival appears to have been part of his postictal state. Case is discussed with Dr. Gerarda Fraction of family practice service who agrees to admit the patient. He will initially be placed in stepdown unit under observation status.   Delora Fuel, MD [id number] [phone]

## 2015-03-25 NOTE — ED Notes (Signed)
Report called to 2C 

## 2015-03-25 NOTE — Progress Notes (Signed)
Patient admitted to 2C08. Patient alert/oriented and ambulated to bed without difficulty. CHG bath completed. Patient denies any pain or discomfort at this time.

## 2015-03-26 ENCOUNTER — Observation Stay (HOSPITAL_COMMUNITY): Payer: BLUE CROSS/BLUE SHIELD

## 2015-03-26 DIAGNOSIS — R569 Unspecified convulsions: Secondary | ICD-10-CM | POA: Diagnosis not present

## 2015-03-26 DIAGNOSIS — R739 Hyperglycemia, unspecified: Secondary | ICD-10-CM | POA: Diagnosis not present

## 2015-03-26 LAB — BASIC METABOLIC PANEL
ANION GAP: 9 (ref 5–15)
BUN: 9 mg/dL (ref 6–20)
CALCIUM: 8.7 mg/dL — AB (ref 8.9–10.3)
CO2: 25 mmol/L (ref 22–32)
Chloride: 102 mmol/L (ref 101–111)
Creatinine, Ser: 1.19 mg/dL (ref 0.61–1.24)
GFR calc Af Amer: >60 mL/min (ref >60)
GLUCOSE: 304 mg/dL — AB (ref 65–99)
Potassium: 3.2 mmol/L — ABNORMAL LOW (ref 3.5–5.1)
Sodium: 136 mmol/L (ref 135–145)

## 2015-03-26 LAB — HEMOGLOBIN A1C
HEMOGLOBIN A1C: 13.4 % — AB (ref 4.8–5.6)
MEAN PLASMA GLUCOSE: 338 mg/dL

## 2015-03-26 LAB — GLUCOSE, CAPILLARY
GLUCOSE-CAPILLARY: 256 mg/dL — AB (ref 65–99)
GLUCOSE-CAPILLARY: 274 mg/dL — AB (ref 65–99)
Glucose-Capillary: 174 mg/dL — ABNORMAL HIGH (ref 65–99)
Glucose-Capillary: 270 mg/dL — ABNORMAL HIGH (ref 65–99)
Glucose-Capillary: 341 mg/dL — ABNORMAL HIGH (ref 65–99)

## 2015-03-26 MED ORDER — GLIMEPIRIDE 4 MG PO TABS: ORAL_TABLET | ORAL | Status: DC

## 2015-03-26 MED ORDER — NYSTATIN 100000 UNIT/ML MT SUSP: 5.0000 mL | Freq: Four times a day (QID) | OROMUCOSAL | Status: DC

## 2015-03-26 MED ORDER — LIVING WELL WITH DIABETES BOOK
Freq: Once | Status: AC
  Administered 2015-03-26: 12:00:00
  Filled 2015-03-26: qty 1

## 2015-03-26 MED ORDER — POTASSIUM CHLORIDE CRYS ER 20 MEQ PO TBCR
40.0000 meq | EXTENDED_RELEASE_TABLET | Freq: Once | ORAL | Status: AC
  Administered 2015-03-26: 40 meq via ORAL
  Filled 2015-03-26: qty 2

## 2015-03-26 MED ORDER — LISINOPRIL 20 MG PO TABS
40.0000 mg | ORAL_TABLET | Freq: Every day | ORAL | Status: DC
  Administered 2015-03-26: 40 mg via ORAL
  Filled 2015-03-26: qty 2

## 2015-03-26 MED ORDER — METFORMIN HCL 1000 MG PO TABS: 1000.0000 mg | ORAL_TABLET | Freq: Two times a day (BID) | ORAL | Status: DC

## 2015-03-26 NOTE — Progress Notes (Signed)
NEURO HOSPITALIST PROGRESS NOTE   SUBJECTIVE:                                                                                                                        Resting comfortably in bed. No further seizures noted. He offers no neurological complains. MRI brain was personally reviewed and showed no acute abnormality. EEG pending. Glucose improving (174).    OBJECTIVE:                                                                                                                           Vital signs in last 24 hours: Temp:  [98.6 F (37 C)-99.5 F (37.5 C)] 98.6 F (37 C) (12/12 0801) Pulse Rate:  [87-104] 87 (12/12 0801) Resp:  [12-22] 12 (12/12 0801) BP: (133-176)/(72-94) 153/93 mmHg (12/12 0801) SpO2:  [91 %-100 %] 97 % (12/12 0801) Weight:  [107.775 kg (237 lb 9.6 oz)] 107.775 kg (237 lb 9.6 oz) (12/11 1230)  Intake/Output from previous day: 12/11 0701 - 12/12 0700 In: 360 [P.O.:360] Out: 2525 [Urine:2525] Intake/Output this shift:   Nutritional status: Diet heart healthy/carb modified Room service appropriate?: Yes; Fluid consistency:: Thin  Past Medical History  Diagnosis Date  . Diabetes mellitus   . CAD (coronary artery disease)   . Hyperlipidemia   . Hypertension     Physical exam:  Constitutional: well developed, pleasant male in no apparent distress. Eyes: no jaundice or exophthalmos.  Head: normocephalic. Neck: supple, no bruits, no JVD. Cardiac: no murmurs. Lungs: clear. Abdomen: soft, no tender, no mass. Extremities: no edema, clubbing, or cyanosis.  Skin: no rash   Neurologic Exam:  General: NAD Mental Status: Alert, oriented, thought content appropriate.  Speech fluent without evidence of aphasia.  Able to follow 3 step commands without difficulty. Cranial Nerves: II: Discs flat bilaterally; Visual fields grossly normal, pupils equal, round, reactive to light and accommodation III,IV, VI: ptosis not  present, extra-ocular motions intact bilaterally V,VII: smile symmetric, facial light touch sensation normal bilaterally VIII: hearing normal bilaterally IX,X: uvula rises symmetrically XI: bilateral shoulder shrug XII: midline tongue extension without atrophy or fasciculations  Motor: Right : Upper extremity   5/5    Left:     Upper extremity  5/5  Lower extremity   5/5     Lower extremity   5/5 Tone and bulk:normal tone throughout; no atrophy noted Sensory: Pinprick and light touch intact throughout, bilaterally Deep Tendon Reflexes:  Right: Upper Extremity   Left: Upper extremity   biceps (C-5 to C-6) 2/4   biceps (C-5 to C-6) 2/4 tricep (C7) 2/4    triceps (C7) 2/4 Brachioradialis (C6) 2/4  Brachioradialis (C6) 2/4  Lower Extremity Lower Extremity  quadriceps (L-2 to L-4) 2/4   quadriceps (L-2 to L-4) 2/4 Achilles (S1) 2/4   Achilles (S1) 2/4  Plantars: Right: downgoing   Left: downgoing Cerebellar: normal finger-to-nose,  normal heel-to-shin test Gait: No tested due to mental status    Lab Results: Lab Results  Component Value Date/Time   CHOL 213* 11/15/2014 04:46 PM   Lipid Panel No results for input(s): CHOL, TRIG, HDL, CHOLHDL, VLDL, LDLCALC in the last 72 hours.  Studies/Results: Ct Head Wo Contrast  03/25/2015  CLINICAL DATA:  Seizure earlier today EXAM: CT HEAD WITHOUT CONTRAST TECHNIQUE: Contiguous axial images were obtained from the base of the skull through the vertex without intravenous contrast. COMPARISON:  None. FINDINGS: The ventricles are upper normal in size and normal in configuration. The sulci appear normal. There is a small cavum septum pellucidum, an anatomic variant. There is no intracranial mass, hemorrhage, extra-axial fluid collection, or midline shift. Gray and white compartments appear normal. No acute infarct evident. The bony calvarium appears intact. The visualized mastoid air cells are clear. No intraorbital lesions are identified.  IMPRESSION: Study within normal limits. Electronically Signed   By: Lowella Grip III M.D.   On: 03/25/2015 07:18   Mr Brain Wo Contrast  03/25/2015  CLINICAL DATA:  Seizure. Marked hyperglycemia. History of hyperlipidemia and hypertension. EXAM: MRI HEAD WITHOUT CONTRAST TECHNIQUE: Multiplanar, multiecho pulse sequences of the brain and surrounding structures were obtained without intravenous contrast. COMPARISON:  CT head without contrast from the same day. FINDINGS: Periventricular T2 hyperintensities are greater than expected for age. No other significant white matter disease is present. There is no significant atrophy. The ventricles are of normal size. A small cavum septum pellucidum is noted. The internal auditory canals are within normal limits bilaterally. The brainstem is unremarkable. The cerebellum is unremarkable. Flow is present in the major intracranial arteries. The globes and orbits are intact. The paranasal sinuses and mastoid air cells are clear. The skullbase is within normal limits. Midline sagittal images are unremarkable. IMPRESSION: 1. No acute intracranial abnormality. 2. Periventricular white matter changes are greater than expected for age. The finding is nonspecific but can be seen in the setting of chronic microvascular ischemia, a demyelinating process such as multiple sclerosis, vasculitis, complicated migraine headaches, or as the sequelae of a prior infectious or inflammatory process. Electronically Signed   By: San Morelle M.D.   On: 03/25/2015 19:21    MEDICATIONS  Scheduled: . aspirin  325 mg Oral Daily  . atorvastatin  20 mg Oral q1800  . carvedilol  12.5 mg Oral BID  . heparin  5,000 Units Subcutaneous 3 times per day  . hydrochlorothiazide  12.5 mg Oral Daily  . insulin aspart  0-9 Units Subcutaneous 6 times per day  . insulin  glargine  10 Units Subcutaneous Daily  . nystatin  5 mL Oral QID  . pantoprazole  40 mg Oral Daily    ASSESSMENT/PLAN:                                                                                                           39 year old male with new onset seizures, likely provoked seizures in the setting of severe hyperglycemia. MRI brain unremarkable. Non focal exam. EEG pending, will sign off if EEG normal.  Dorian Pod, MD [facility] [phone]  03/26/2015, 9:59 AM

## 2015-03-26 NOTE — Progress Notes (Addendum)
Pt does not want to go home on Insulin MD came and talked to pt. In room. Pt did watch 4 Diabetic videos on 503 Managing your diabetes, 504 Intro to carb counting, 507 Diab and Nutrition : eating for your health, and 509 Diab and Heart Disease Wants to walk in hall and d/c later today

## 2015-03-26 NOTE — Progress Notes (Addendum)
Spoke with patient about his diabetes.  Was diagnosed about 19 years ago at the age of 43. Was started on oral medication at the time, was later put on insulin, and then changed back to oral meds. Sees physician at [facility]. Was last seen about 3-4 months ago. Takes Jardiance, Metformin, and Amaryl at home for diabetes. Stated that he had not picked up his prescriptions for these meds in the last 2 weeks. Had not been checking his blood sugars at home like he should. Stated that his last HgbA1C was 8%. Girlfriend currently present in the room with him.Does not want to take insulin if he doesn't have to, but has been on insulin before.  Ordered Living Well with Diabetes booklet, staff RNs will help patient watch diabetes videos [phone], consult for dietician and care management. Will continue to follow while in hospital. Harvel Ricks RN BSN CDE

## 2015-03-26 NOTE — Progress Notes (Signed)
Pt ambulated in  hallway 250 feet unaided by NT. Tolerated well.

## 2015-03-26 NOTE — Progress Notes (Signed)
EEG completed, results pending. 

## 2015-03-26 NOTE — Progress Notes (Signed)
Family Medicine Teaching Service Daily Progress Note Intern Pager: [phone]  Patient name: [name] Medical record number: [id number] Date of birth: 09/30/1975 Age: 39 y.o. Gender: male  Primary Care Provider: JEFFERY,CHELLE, PA-C Consultants: Neurology Code Status: FULL   Pt Overview and Major Events to Date:  12/11: Pt admitted for two clonic seizures and Diabetes with hyperglycemia   Assessment and Plan: [name] is a 39 y.o. male presenting with seizure-like activity x2 and hyperglycemia . PMH is significant for significant for T2DM, HTN, HLD, CAD s/p CABG x3.   #Seizure-like activity: Patient presented with 2 witnessed seizure episodes. Patient does not have a history of seizure disorder. CT head within normal limits. On presentation patient had hyperglycemia. Elevated sugars most likely cause of seizures. No signs of other electrolyte abnormalities(normal Mg and Ca) causing seizure, UDS and ethanol level normal (UDS taken after benzo with EMS) so do not believe this is from toxicity or withdrawal. ECG without signs of arrhythmia; some enlargement.  -Neuro consulted , appreciate recs  -MRI brain: no acute intracranial abnormality; nonspecific white matter changes  -EEG today  -neuro checks q4hrs -seizure precautions -repeat labs in AM - will add TSH to rule out as cause of seizure; TSH WNL  #Hyperglycemia in T2DM: On initial presentation blood sugar 695. Patient given NovoLog 15 units in the ED. He was noted to have metabolic acidosis: AG 18, CO2 21. ABG 7.35/40/97/22.2. Do not believe he is in DKA. Last A1c obtained August 2016 was 10.5. Patient is noncompliant with medications and last took diabetes medications about 5 days ago. CBGs have remained elevated from 256-371.  -Repeat A1c pending   -lantus 10 units QHS  -Every 4 CBG checks with sliding scale insulin -increase lantus amount based on insulin required in 24 hour period  -holding home metformin, Amaryl,  Jardiance -diabetes education/coordinator -Will likely go home on insulin  #HTN: Currently mildly hypertensive.  - Continue home Coreg and HCTZ; holding lisinopril in setting of AKI -consider restarting lisinopril today for better BP control  #AKI, Resolved. Prior renal function test showing mild kidney dysfunction. 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 Creatinine on admission 1.69; baseline appears to be 1.1. Labs most consistent with intrinsic etiology of AKI. Most likely 2/2 seizure episodes. Protein appreciated on UA. Cr 1.19 (12/12).  -hydrate with NS -monitor I/O -avoid nephrotoxic medications   #Hyponatremia, Resolved: On admission 130. No past medical history of hyponatremia. Na 136 on 12/12.  -will continue to monitor -repeat in AM  #Hypokalemia: 3.2 on 12/12.  -40 mEq of Kdur ordered  -continue to monitor   #HLD: Last lipid panel 11/2014 with elevated total cholesterol and triglycerides. -continue home Lipitor 20 at bedtime  FEN/GI: HH/carb-modified diet, NS @125 , PPI Prophylaxis: Hep Subq  Disposition: Home pending EEG and CBG control   Subjective:  States he is feeling better this morning. Anxious about going home with insulin but understands that he needs better glucose control. Ambulating short distances without issue.   Objective: Temp:  [98.7 F (37.1 C)-99.5 F (37.5 C)] 98.9 F (37.2 C) (12/12 0400) Pulse Rate:  [88-108] 89 (12/12 0400) Resp:  [10-26] 18 (12/12 0400) BP: (133-181)/(72-97) 158/94 mmHg (12/12 0400) SpO2:  [91 %-100 %] 94 % (12/12 0400) Weight:  [237 lb 9.6 oz (107.775 kg)] 237 lb 9.6 oz (107.775 kg) (12/11 1230) Physical Exam: General: alert and awake, sitting on side of bed, NAD  Cardiovascular: RRR. No murmurs.  Respiratory: CTAB, normal WOB  Abdomen: soft,  NTND Extremities: No LE edema Neuro: CN II-XII intact. +5 strength globally. A&Ox3   Laboratory:  Recent Labs Lab 03/25/15 0634 03/25/15 1400  WBC 10.9* 13.9*  HGB 13.3  12.8*  HCT 39.9 38.0*  PLT 263 275    Recent Labs Lab 03/25/15 0634 03/25/15 1400 03/26/15 0242  NA 130*  --  136  K 4.1  --  3.2*  CL 91*  --  102  CO2 21*  --  25  BUN 13  --  9  CREATININE 1.69* 1.26* 1.19  CALCIUM 9.0  --  8.7*  PROT 6.1*  --   --   BILITOT 0.7  --   --   ALKPHOS 72  --   --   ALT 23  --   --   AST 25  --   --   GLUCOSE 695*  --  304*    Imaging/Diagnostic Tests: Ct Head Wo Contrast  03/25/2015  CLINICAL DATA:  Seizure earlier today EXAM: CT HEAD WITHOUT CONTRAST TECHNIQUE: Contiguous axial images were obtained from the base of the skull through the vertex without intravenous contrast. COMPARISON:  None. FINDINGS: The ventricles are upper normal in size and normal in configuration. The sulci appear normal. There is a small cavum septum pellucidum, an anatomic variant. There is no intracranial mass, hemorrhage, extra-axial fluid collection, or midline shift. Gray and white compartments appear normal. No acute infarct evident. The bony calvarium appears intact. The visualized mastoid air cells are clear. No intraorbital lesions are identified. IMPRESSION: Study within normal limits. Electronically Signed   By: Lowella Grip III M.D.   On: 03/25/2015 07:18   Mr Brain Wo Contrast  03/25/2015  CLINICAL DATA:  Seizure. Marked hyperglycemia. History of hyperlipidemia and hypertension. EXAM: MRI HEAD WITHOUT CONTRAST TECHNIQUE: Multiplanar, multiecho pulse sequences of the brain and surrounding structures were obtained without intravenous contrast. COMPARISON:  CT head without contrast from the same day. FINDINGS: Periventricular T2 hyperintensities are greater than expected for age. No other significant white matter disease is present. There is no significant atrophy. The ventricles are of normal size. A small cavum septum pellucidum is noted. The internal auditory canals are within normal limits bilaterally. The brainstem is unremarkable. The cerebellum is  unremarkable. Flow is present in the major intracranial arteries. The globes and orbits are intact. The paranasal sinuses and mastoid air cells are clear. The skullbase is within normal limits. Midline sagittal images are unremarkable. IMPRESSION: 1. No acute intracranial abnormality. 2. Periventricular white matter changes are greater than expected for age. The finding is nonspecific but can be seen in the setting of chronic microvascular ischemia, a demyelinating process such as multiple sclerosis, vasculitis, complicated migraine headaches, or as the sequelae of a prior infectious or inflammatory process. Electronically Signed   By: San Morelle M.D.   On: 03/25/2015 19:21    Nicolette Bang, DO 03/26/2015, 7:03 AM PGY-1, [place] Intern pager: [phone], text pages welcome

## 2015-03-26 NOTE — Progress Notes (Signed)
Pt eating now ready for d/c as soon as done eating D/C IV . Family at bedside

## 2015-03-26 NOTE — Progress Notes (Addendum)
Pt discharged per w/c with all belongings, exit care notes regarding S/S of high Blood sugar Diabetes teaching book. Girlfriend was here for teaching notes, his mom was here for viewing Diabetic videos   Pt has scripts called into pharmacy for meds. Aware he needs to pick these up. Tongue still very much coated - instructed pt to brush teeth and tongue 4 times a day prior to Nystatin swish and swallow Pt had insulin coverage with dinner meal instructed to start his Diabetic PO meds in am.

## 2015-03-26 NOTE — Procedures (Signed)
History: 39 yo M with seizure in the setting of hyperglycemia.   Sedation: None  Technique: This is a 21 channel routine scalp EEG performed at the bedside with bipolar and monopolar montages arranged in accordance to the international 10/20 system of electrode placement. One channel was dedicated to EKG recording.    Background: The background consists of intermixed alpha and beta activities. There is a well defined posterior dominant rhythm of 9 Hz that attenuates with eye opening. Sleep is recorded with normal appearing structures.   Photic stimulation: Physiologic driving is present  EEG Abnormalities: none  Clinical Interpretation: This normal EEG is recorded in the waking and sleep state. There was no seizure or seizure predisposition recorded on this study.   Roland Rack, MD [facility] [phone]  If 7pm- 7am, please page neurology on call as listed in [place].

## 2015-03-26 NOTE — Progress Notes (Signed)
Went over discharge instructions with pt He knows there are a few meds to pick up at pharmacy. Went over exit care notes and they are attached to back of d/c instructions on : Basic carb counting and Diabetic ketoacidosis

## 2015-03-26 NOTE — Plan of Care (Signed)
Problem: Food- and Nutrition-Related Knowledge Deficit (NB-1.1) Goal: Nutrition education Formal process to instruct or train a patient/client in a skill or to impart knowledge to help patients/clients voluntarily manage or modify food choices and eating behavior to maintain or improve health. Outcome: Completed/Met Date Met:  03/26/15  RD consulted for nutrition education regarding diabetes.     Lab Results  Component Value Date    HGBA1C 13.4* 03/25/2015    RD provided "Carbohydrate Counting for People with Diabetes" handout from the Academy of Nutrition and Dietetics. Discussed different food groups and their effects on blood sugar, emphasizing carbohydrate-containing foods. Provided list of carbohydrates and recommended serving sizes of common foods.  Discussed importance of controlled and consistent carbohydrate intake throughout the day. Provided examples of ways to balance meals/snacks and encouraged intake of high-fiber, whole grain complex carbohydrates. Teach back method used.  Expect fair compliance.  Body mass index is 37.2 kg/(m^2). Pt meets criteria for Obesity Class II based on current BMI.  Current diet order is Heart Healthy/Carbohydrate Modified, patient is consuming approximately 100% of meals at this time. Labs and medications reviewed. No further nutrition interventions warranted at this time. If additional nutrition issues arise, please re-consult RD.  Arthur Holms, RD, LDN Pager #: [phone] After-Hours Pager #: [phone]

## 2015-03-26 NOTE — Discharge Instructions (Signed)
You were hospitalized for seizures from your high blood sugars from uncontrolled diabetes. It is VERY important that you start taking your medications daily. Additionally, you need to make some changes to your diet and your exercise routine. We have sent refills from your metformin and amaryl. Please go to [place] [facility] this week for follow up. Make sure you drink plenty of water. You need to pee out as much water as you are drinking for the next few days.

## 2015-03-26 NOTE — Care Management Note (Signed)
Case Management Note  Patient Details  Name: SYON WINTERHALTER MRN: [id number] Date of Birth: 1975/04/17  Subjective/Objective:        Adm w seizure, diabetes            Lives at home   Expected Discharge Date:                  Expected Discharge Plan:  Home/Self Care  In-House Referral:     Discharge [facility]  Post Acute Care Choice:    Choice offered to:     DME Arranged:    DME Agency:     HH Arranged:    [place] Agency:     Status of Service:     Medicare Important Message Given:    Date Medicare IM Given:    Medicare IM give by:    Date Additional Medicare IM Given:    Additional Medicare Important Message give by:     If discussed at [place] of Stay Meetings, dates discussed:    Additional Comments:has ins at present but lost job so may loose ins. Gave pt inform on [place] [facility].  Lacretia Leigh, RN 03/26/2015, 11:54 AM

## 2015-03-26 NOTE — Discharge Summary (Signed)
[facility] Discharge Summary  Patient name: [name] Medical record number: [id number] Date of birth: [birth year] Age: 39 y.o. Gender: male Date of Admission: 03/25/2015  Date of Discharge: 03/26/2015 Admitting Physician: Leeanne Rio, MD  Primary Care Provider: JEFFERY,CHELLE, PA-C Consultants: Neurology  Indication for Hospitalization: Seizure Like Activity, Hyperglycemia   Discharge Diagnoses/Problem List:  Patient Active Problem List   Diagnosis Date Noted  . Hyperglycemia   . Seizure ([place]) 03/25/2015  . Observed seizure-like activity ([place]) 03/25/2015  . 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 Diabetes type 2, uncontrolled ([place]) 04/21/2013  . Blurred vision 11/20/2011  . Obesity, Class II, BMI 35-39.9, with comorbidity ([place]) 11/19/2011  . Hypertension 05/11/2011  . Hyperlipidemia 05/11/2011  . CAD (coronary artery disease) 05/11/2011    Disposition: Home  Discharge Condition: Stable   Discharge Exam:  General: alert and awake, sitting on side of bed, NAD  Cardiovascular: RRR. No murmurs.  Respiratory: CTAB, normal WOB  Abdomen: soft, NTND Extremities: No LE edema Neuro: CN II-XII intact. +5 strength globally. A&Ox3   Brief Hospital Course:  [name] is 39 y.o. male with PMH of diabetes, HTN, and CAD s/p CABG x3 who presented with two clonic seizures. Glucose found to be elevated to 695 at presentation and patient reported poor compliance with diabetes medications. CT head was negative, UDS/ethanol levels were negative, EKG without signs of arrhythmia, and no significant electrolyte abnormalities were present. MRI brain was performed and was negative for an acute abnormality.   Neurology was consulted. EEG was performed and was normal. Patient did not have any further seizure like activity and glucose levels were stabilized during hospitalization. Repeat A1C was 13.4 during hospitalization. Patient was very resistant to starting an  insulin regimen. Patient reports that he was diagnosed with diabetes when he was 66, so probable that he has Type I DM and will require insulin in the future regardless. Since patient has financial hardship, is losing insurance in 30 days, and was resistant to insulin continuation of oral therapy was felt to be best with emphasis on diet and lifestyle modifications.  He was discharged with prescriptions for Metformin and Amaryl. Instructed to see his PCP at [facility] on the week of discharge for follow up.  Issues for Follow Up:  1. MRI with non-specific white matter changes suggestive of chronic microvascular ischemia vs. MS vs complicated migraine headaches vs. Prior infectious or inflammatory process. Recommend further follow up.  2. Diabetes : Would have normally recommended starting insulin in this patient but concern about compliance and affordability of insulin regimen. Recommend continued education and possible switch to insulin regimen from oral therapy.   Significant Procedures: None   Significant Labs and Imaging:   Recent Labs Lab 03/25/15 0634 03/25/15 1400  WBC 10.9* 13.9*  HGB 13.3 12.8*  HCT 39.9 38.0*  PLT 263 275    Recent Labs Lab 03/25/15 0634 03/25/15 1400 03/26/15 0242  NA 130*  --  136  K 4.1  --  3.2*  CL 91*  --  102  CO2 21*  --  25  GLUCOSE 695*  --  304*  BUN 13  --  9  CREATININE 1.69* 1.26* 1.19  CALCIUM 9.0  --  8.7*  MG 2.5*  --   --   PHOS  --  2.5  --   ALKPHOS 72  --   --   AST 25  --   --   ALT 23  --   --  ALBUMIN 3.2*  --   --     HgB A1C 13.4   Ct Head Wo Contrast  03/25/2015  CLINICAL DATA:  Seizure earlier today EXAM: CT HEAD WITHOUT CONTRAST TECHNIQUE: Contiguous axial images were obtained from the base of the skull through the vertex without intravenous contrast. COMPARISON:  None. FINDINGS: The ventricles are upper normal in size and normal in configuration. The sulci appear normal. There is a small cavum septum pellucidum, an  anatomic variant. There is no intracranial mass, hemorrhage, extra-axial fluid collection, or midline shift. Gray and white compartments appear normal. No acute infarct evident. The bony calvarium appears intact. The visualized mastoid air cells are clear. No intraorbital lesions are identified. IMPRESSION: Study within normal limits. Electronically Signed   By: Lowella Grip III M.D.   On: 03/25/2015 07:18   Mr Brain Wo Contrast  03/25/2015  CLINICAL DATA:  Seizure. Marked hyperglycemia. History of hyperlipidemia and hypertension. EXAM: MRI HEAD WITHOUT CONTRAST TECHNIQUE: Multiplanar, multiecho pulse sequences of the brain and surrounding structures were obtained without intravenous contrast. COMPARISON:  CT head without contrast from the same day. FINDINGS: Periventricular T2 hyperintensities are greater than expected for age. No other significant white matter disease is present. There is no significant atrophy. The ventricles are of normal size. A small cavum septum pellucidum is noted. The internal auditory canals are within normal limits bilaterally. The brainstem is unremarkable. The cerebellum is unremarkable. Flow is present in the major intracranial arteries. The globes and orbits are intact. The paranasal sinuses and mastoid air cells are clear. The skullbase is within normal limits. Midline sagittal images are unremarkable. IMPRESSION: 1. No acute intracranial abnormality. 2. Periventricular white matter changes are greater than expected for age. The finding is nonspecific but can be seen in the setting of chronic microvascular ischemia, a demyelinating process such as multiple sclerosis, vasculitis, complicated migraine headaches, or as the sequelae of a prior infectious or inflammatory process. Electronically Signed   By: San Morelle M.D.   On: 03/25/2015 19:21    Results/Tests Pending at Time of Discharge: None   Discharge Medications:    Medication List    TAKE these  medications        aspirin 325 MG tablet  Take 325 mg by mouth daily.     atorvastatin 20 MG tablet  Commonly known as:  LIPITOR  Take 1 tablet by mouth at bedtime.     carvedilol 12.5 MG tablet  Commonly known as:  COREG  Take 1 tablet by mouth 2 (two) times daily.     cholecalciferol 1000 UNITS tablet  Commonly known as:  VITAMIN D  Take 1,000 Units by mouth daily.     empagliflozin 10 MG Tabs tablet  Commonly known as:  JARDIANCE  Take 10 mg by mouth daily.     fish oil-omega-3 fatty acids 1000 MG capsule  Take 2 g by mouth daily.     glimepiride 4 MG tablet  Commonly known as:  AMARYL  Take 1 tablet twice a day before meals.     lisinopril-hydrochlorothiazide 20-12.5 MG tablet  Commonly known as:  PRINZIDE,ZESTORETIC  Take 2 tablets by mouth daily.     metFORMIN 1000 MG tablet  Commonly known as:  GLUCOPHAGE  Take 1 tablet (1,000 mg total) by mouth 2 (two) times daily with a meal.     multivitamin tablet  Take 1 tablet by mouth daily.     nitroGLYCERIN 0.4 MG SL tablet  Commonly known as:  NITROSTAT  Place 1 tablet (0.4 mg total) under the tongue every 5 (five) minutes as needed for chest pain. REQUEST FUTURE REFILLS FROM PCP        Discharge Instructions: Please refer to Patient Instructions section of EMR for full details.  Patient was counseled important signs and symptoms that should prompt return to medical care, changes in medications, dietary instructions, activity restrictions, and follow up appointments.   Follow-Up Appointments: Follow-up Information    Go to JEFFERY,CHELLE, PA-C.   Specialty:  Family Medicine   Why:  For Hospital Followup   Contact information:   [place] [id number]       Nicolette Bang, DO 03/26/2015, 3:45 PM PGY-1, [place]

## 2015-03-26 NOTE — Progress Notes (Signed)
Text page to Internal med teaching group to let them know that pt has relayed to me he does not want to go home on Insulin. Pt met with Diabetic coordinator and [place]. Pt also wants to go home today.

## 2015-03-26 NOTE — Progress Notes (Signed)
Inpatient Diabetes Program Recommendations  AACE/ADA: New Consensus Statement on Inpatient Glycemic Control (2015)  Target Ranges:  Prepandial:   less than 140 mg/dL      Peak postprandial:   less than 180 mg/dL (1-2 hours)      Critically ill patients:  140 - 180 mg/dL   Review of Glycemic Control  Diabetes history: Type 2  Outpatient Diabetes medications: Jardiance, Metformin, Amaryl Current orders for Inpatient glycemic control: Lantus 10 units daily, Novolog SENSITIVE correction scale every 4 hours.  Inpatient Diabetes Program Recommendations:  According to notes, patient has not taken insulin in the past.  According to weight based dose of insulin, patient would need Lantus 20 units (0.2 units/kg). If insurance for insulin is an issue, may want to discharge on 70/30 mixed insulin 15 units BID and will need a PCP to follow him after discharge. Could start 70/30 in the hospital. Will visit patient today. Will continue to monitor blood sugars while in the hospital. Harvel Ricks RN BSN CDE

## 2015-04-02 ENCOUNTER — Ambulatory Visit (INDEPENDENT_AMBULATORY_CARE_PROVIDER_SITE_OTHER): Payer: BLUE CROSS/BLUE SHIELD | Admitting: Emergency Medicine

## 2015-04-02 ENCOUNTER — Ambulatory Visit (INDEPENDENT_AMBULATORY_CARE_PROVIDER_SITE_OTHER): Payer: BLUE CROSS/BLUE SHIELD

## 2015-04-02 VITALS — BP 136/94 | HR 82 | Temp 98.5°F | Resp 16 | Ht 66.0 in | Wt 236.0 lb

## 2015-04-02 DIAGNOSIS — G4733 Obstructive sleep apnea (adult) (pediatric): Secondary | ICD-10-CM | POA: Diagnosis not present

## 2015-04-02 DIAGNOSIS — E1165 Type 2 diabetes mellitus with hyperglycemia: Secondary | ICD-10-CM | POA: Diagnosis not present

## 2015-04-02 DIAGNOSIS — M25512 Pain in left shoulder: Secondary | ICD-10-CM | POA: Diagnosis not present

## 2015-04-02 MED ORDER — EXENATIDE ER 2 MG ~~LOC~~ SRER
2.0000 mg | SUBCUTANEOUS | Status: DC
Start: 2015-04-02 — End: 2016-01-01

## 2015-04-02 MED ORDER — NAPROXEN SODIUM 550 MG PO TABS: 550.0000 mg | ORAL_TABLET | Freq: Two times a day (BID) | ORAL | Status: DC

## 2015-04-02 NOTE — Patient Instructions (Signed)

## 2015-04-02 NOTE — Progress Notes (Signed)
Subjective:  Patient ID: [name], male    DOB: September 14, 1975  Age: 39 y.o. MRN: [id number]  CC: Hyperlipidemia; Medication Refill; and Shoulder Pain   HPI [name] presents  follow-up following an admission to the hospital with a blood sugar 695 apparently had a seizure at home and fell out of bed and landed on his left shoulder and has had persistent pain from that. He had another seizure in the ambulance treated with benzodiazepine injection. He was treated in the hospital with IV insulin and fluids and discharged with instructions to follow-up. During his hospitalization he was evaluated with EEG and seen by her neurology who didn't feel that he had epilepsy or reason follow-up.  He has no explanation for his own willingness to follow his dosage schedule for treatment of his diabetes. He this is surprising and in view of his four-vessel bypass at the age of 70.  History [name] has a past medical history of Diabetes mellitus; CAD (coronary artery disease); Hyperlipidemia; and Hypertension.   He has past surgical history that includes Coronary artery bypass graft.   His  family history includes Diabetes in his father; Heart disease in his father; Hyperlipidemia in his father; Hypertension in his father and mother.  He   reports that he quit smoking about 6 years ago. He has never used smokeless tobacco. He reports that he drinks alcohol. He reports that he does not use illicit drugs.  Outpatient Prescriptions Prior to Visit  Medication Sig Dispense Refill  . aspirin 325 MG tablet Take 325 mg by mouth daily.    Marland Kitchen atorvastatin (LIPITOR) 20 MG tablet Take 1 tablet by mouth at bedtime. 30 tablet 5  . carvedilol (COREG) 12.5 MG tablet Take 1 tablet by mouth 2 (two) times daily.    . cholecalciferol (VITAMIN D) 1000 UNITS tablet Take 1,000 Units by mouth daily.    . fish oil-omega-3 fatty acids 1000 MG capsule Take 2 g by mouth daily.    Marland Kitchen glimepiride (AMARYL) 4 MG tablet  Take 1 tablet twice a day before meals. 180 tablet 0  . lisinopril-hydrochlorothiazide (PRINZIDE,ZESTORETIC) 20-12.5 MG per tablet Take 2 tablets by mouth daily. 60 tablet 5  . metFORMIN (GLUCOPHAGE) 1000 MG tablet Take 1 tablet (1,000 mg total) by mouth 2 (two) times daily with a meal. 180 tablet 0  . Multiple Vitamin (MULTIVITAMIN) tablet Take 1 tablet by mouth daily.    Marland Kitchen nystatin (MYCOSTATIN) 100000 UNIT/ML suspension Take 5 mLs (500,000 Units total) by mouth 4 (four) times daily. 120 mL 0  . empagliflozin (JARDIANCE) 10 MG TABS tablet Take 10 mg by mouth daily. 90 tablet 3  . nitroGLYCERIN (NITROSTAT) 0.4 MG SL tablet Place 1 tablet (0.4 mg total) under the tongue every 5 (five) minutes as needed for chest pain. REQUEST FUTURE REFILLS FROM PCP (Patient not taking: Reported on 04/02/2015) 25 tablet 0   No facility-administered medications prior to visit.    Social History   Social History  . Marital Status: Married    Spouse Name: N/A  . Number of Children: N/A  . Years of Education: N/A   Occupational History  . PC technician [employer]   Social History Main Topics  . Smoking status: Former Smoker    Quit date: 09/17/2008  . Smokeless tobacco: Never Used  . Alcohol Use: Yes     Comment: drink beer occas. (4-5 a week)  . Drug Use: No  . Sexual Activity: No  Other Topics Concern  . None   Social History Narrative   Epworth Sleepiness Scale 01/10/2015 = 12/13     Review of Systems  Constitutional: Negative for fever, chills and appetite change.  HENT: Negative for congestion, ear pain, postnasal drip, sinus pressure and sore throat.   Eyes: Negative for pain and redness.  Respiratory: Negative for cough, shortness of breath and wheezing.   Cardiovascular: Negative for leg swelling.  Gastrointestinal: Negative for nausea, vomiting, abdominal pain, diarrhea, constipation and blood in stool.  Endocrine: Negative for polyuria.  Genitourinary: Negative for  dysuria, urgency, frequency and flank pain.  Musculoskeletal: Negative for gait problem.  Skin: Negative for rash.  Neurological: Negative for weakness and headaches.  Psychiatric/Behavioral: Negative for confusion and decreased concentration. The patient is not nervous/anxious.     Objective:  BP 136/94 mmHg  Pulse 82  Temp(Src) 98.5 F (36.9 C)  Resp 16  Ht 5\' 6"  (1.676 m)  Wt 236 lb (107.049 kg)  BMI 38.11 kg/m2  SpO2 98%  Physical Exam  Constitutional: He is oriented to person, place, and time. He appears well-developed and well-nourished. No distress.  HENT:  Head: Normocephalic and atraumatic.  Right Ear: External ear normal.  Left Ear: External ear normal.  Nose: Nose normal.  Eyes: Conjunctivae and EOM are normal. Pupils are equal, round, and reactive to light. No scleral icterus.  Neck: Normal range of motion. Neck supple. No tracheal deviation present.  Cardiovascular: Normal rate, regular rhythm and normal heart sounds.   Pulmonary/Chest: Effort normal. No respiratory distress. He has no wheezes. He has no rales.  Abdominal: He exhibits no mass. There is no tenderness. There is no rebound and no guarding.  Musculoskeletal: He exhibits no edema.  Lymphadenopathy:    He has no cervical adenopathy.  Neurological: He is alert and oriented to person, place, and time. Coordination normal.  Skin: Skin is warm and dry. No rash noted.  Psychiatric: He has a normal mood and affect. His behavior is normal.      Assessment & Plan:   [name] was seen today for hyperlipidemia, medication refill and shoulder pain.  Diagnoses and all orders for this visit:  OSA (obstructive sleep apnea) -     Ambulatory referral to Neurology  Uncontrolled type 2 diabetes mellitus with hyperglycemia, without long-term current use of insulin ([place]) -     Ambulatory referral to Endocrinology  Left shoulder pain -     DG Shoulder Left; Future  Other orders -     Exenatide ER (BYDUREON) 2  MG SRER; Inject 2 mg into the skin once a week. -     naproxen sodium (ANAPROX DS) 550 MG tablet; Take 1 tablet (550 mg total) by mouth 2 (two) times daily with a meal.   I have discontinued Mr. Lewandoski empagliflozin. I am also having him start on Exenatide ER and naproxen sodium. Additionally, I am having him maintain his aspirin, multivitamin, fish oil-omega-3 fatty acids, cholecalciferol, atorvastatin, lisinopril-hydrochlorothiazide, nitroGLYCERIN, carvedilol, glimepiride, metFORMIN, and nystatin.  Meds ordered this encounter  Medications  . Exenatide ER (BYDUREON) 2 MG SRER    Sig: Inject 2 mg into the skin once a week.    Dispense:  4 each    Refill:  12  . naproxen sodium (ANAPROX DS) 550 MG tablet    Sig: Take 1 tablet (550 mg total) by mouth 2 (two) times daily with a meal.    Dispense:  40 tablet    Refill:  0  I placed a consultation with endocrinology for evaluation treatment of his diabetes. Also suggested given his history of his severe snoring daytime sleepiness and an apneic episodes lasting a minute at night that we obtain a sleep study and he was willing to acknowledge increasing that he's got it and abnormal history.   Appropriate red flag conditions were discussed with the patient as well as actions that should be taken.  Patient expressed his understanding.  Follow-up: Return in about 1 month (around 05/03/2015).  Roselee Culver, MD   [facility] reading (PRIMARY) by  Dr. Ouida Sills.  Negative shoulder.

## 2015-04-05 ENCOUNTER — Institutional Professional Consult (permissible substitution): Payer: BLUE CROSS/BLUE SHIELD | Admitting: Neurology

## 2015-04-10 ENCOUNTER — Encounter: Payer: Self-pay | Admitting: Neurology

## 2015-04-17 ENCOUNTER — Institutional Professional Consult (permissible substitution): Payer: BLUE CROSS/BLUE SHIELD | Admitting: Neurology

## 2015-04-17 ENCOUNTER — Telehealth: Payer: Self-pay

## 2015-04-17 NOTE — Telephone Encounter (Signed)
Pt did not show for their appt with Dr. Dohmeier today.  

## 2015-04-18 ENCOUNTER — Encounter: Payer: Self-pay | Admitting: Neurology

## 2015-05-02 ENCOUNTER — Telehealth: Payer: Self-pay

## 2015-05-02 NOTE — Telephone Encounter (Signed)
He was referred to endocrinology in December.  I have only seen him once and did not prescribe jardiance.  We need to see him back or get him into the endocrinology office.

## 2015-05-02 NOTE — Telephone Encounter (Signed)
Patient lost his jobs and wants to know is there is a cheaper alternative to jardiance that we can send.

## 2015-05-04 NOTE — Telephone Encounter (Signed)
Spoke with pt, he states [name] prescribed him Jardiance and she is his PCP. Please advise.

## 2015-05-04 NOTE — Telephone Encounter (Signed)
As he is already on the maximum dose of metformin and glimepiride, this is difficult.  All the medications get pretty expensive.  I added the Jardiance when I first (and last) saw him in August, but I don't see that on his current medication list.  I see Bydureon (Exenatide ER) on the list now, prescribed by Dr. Ouida Sills at the hospital follow-up visit 12/19 to take the place of the Janrdiance.  At that visit, he was also referred to endocrinology, but I don't see that he's seen them yet.  I also see that he did not follow-up with cardiology or go to the nutrition counseling that I referred him for when I saw him in August.  So, what is the current situation? When does he expect to have insurance again? What is he doing to improve his lifestyle to improve his diabetes (exercise, eating changes, etc)?  If he is unlikely to get insurance in the next several months, I suggest: 1. He contact the [facility], as he may qualify for assistance. 2. He contact the [place], as they can provide visits, labs, and often samples, etc at lower cost to uninsured patients 3. He contact the pharmaceutical company to see what patient assistance programs they have that may allow him to stay on the prescribed medication (either Jardiance or Bydureon) until he can secure insurance benefits again.  Either way, he needs to see me, endocrinology or establish at the [place] in the next 2 months.

## 2015-05-07 ENCOUNTER — Institutional Professional Consult (permissible substitution): Payer: BLUE CROSS/BLUE SHIELD | Admitting: Neurology

## 2015-05-08 NOTE — Telephone Encounter (Signed)
Spoke with pt, advised message from [place]. Pt understood and stated he will call [facility] in wellness.

## 2015-05-22 ENCOUNTER — Ambulatory Visit: Payer: BLUE CROSS/BLUE SHIELD | Admitting: Physician Assistant

## 2015-06-29 ENCOUNTER — Other Ambulatory Visit: Payer: Self-pay

## 2015-06-29 MED ORDER — LISINOPRIL-HYDROCHLOROTHIAZIDE 20-12.5 MG PO TABS: 2.0000 | ORAL_TABLET | Freq: Every day | ORAL | Status: DC

## 2015-10-09 ENCOUNTER — Other Ambulatory Visit: Payer: Self-pay

## 2015-10-09 MED ORDER — ATORVASTATIN CALCIUM 20 MG PO TABS: ORAL_TABLET | ORAL | Status: DC

## 2015-10-13 ENCOUNTER — Other Ambulatory Visit: Payer: Self-pay | Admitting: Internal Medicine

## 2015-10-26 ENCOUNTER — Other Ambulatory Visit: Payer: Self-pay

## 2015-10-26 MED ORDER — GLIMEPIRIDE 4 MG PO TABS: ORAL_TABLET | ORAL | Status: DC

## 2015-10-31 ENCOUNTER — Other Ambulatory Visit: Payer: Self-pay | Admitting: Internal Medicine

## 2015-11-02 ENCOUNTER — Other Ambulatory Visit: Payer: Self-pay | Admitting: Internal Medicine

## 2015-11-02 ENCOUNTER — Telehealth: Payer: Self-pay

## 2015-11-02 MED ORDER — METFORMIN HCL 1000 MG PO TABS: 1000.0000 mg | ORAL_TABLET | Freq: Two times a day (BID) | ORAL | Status: DC

## 2015-11-02 NOTE — Telephone Encounter (Signed)
Meds ordered this encounter  Medications  . metFORMIN (GLUCOPHAGE) 1000 MG tablet    Sig: Take 1 tablet (1,000 mg total) by mouth 2 (two) times daily with a meal.    Dispense:  60 tablet    Refill:  0    Order Specific Question:  Supervising Provider    Answer:  Brigitte Pulse, EVA N [[id number]]    Please call this patient. He needs OV and fasting labs for additional fills. Please make sure he knows our new same-day appointment system.

## 2015-11-02 NOTE — Telephone Encounter (Signed)
[facility] fax refil req Metformin 1000mg  tab #180 - sent to [place] per fax

## 2015-11-06 NOTE — Telephone Encounter (Signed)
Advised pt

## 2016-01-01 ENCOUNTER — Ambulatory Visit (INDEPENDENT_AMBULATORY_CARE_PROVIDER_SITE_OTHER): Payer: Self-pay | Admitting: Physician Assistant

## 2016-01-01 ENCOUNTER — Encounter: Payer: Self-pay | Admitting: Physician Assistant

## 2016-01-01 VITALS — BP 166/102 | HR 105 | Temp 98.9°F | Resp 17 | Ht 67.5 in | Wt 251.0 lb

## 2016-01-01 DIAGNOSIS — I10 Essential (primary) hypertension: Secondary | ICD-10-CM

## 2016-01-01 DIAGNOSIS — IMO0001 Reserved for inherently not codable concepts without codable children: Secondary | ICD-10-CM

## 2016-01-01 DIAGNOSIS — Z23 Encounter for immunization: Secondary | ICD-10-CM

## 2016-01-01 DIAGNOSIS — M25512 Pain in left shoulder: Secondary | ICD-10-CM

## 2016-01-01 DIAGNOSIS — G4733 Obstructive sleep apnea (adult) (pediatric): Secondary | ICD-10-CM

## 2016-01-01 DIAGNOSIS — I2510 Atherosclerotic heart disease of native coronary artery without angina pectoris: Secondary | ICD-10-CM

## 2016-01-01 DIAGNOSIS — E1165 Type 2 diabetes mellitus with hyperglycemia: Secondary | ICD-10-CM

## 2016-01-01 DIAGNOSIS — E785 Hyperlipidemia, unspecified: Secondary | ICD-10-CM

## 2016-01-01 DIAGNOSIS — I2583 Coronary atherosclerosis due to lipid rich plaque: Secondary | ICD-10-CM

## 2016-01-01 LAB — CBC WITH DIFFERENTIAL/PLATELET
BASOS PCT: 0 %
Basophils Absolute: 0 cells/uL (ref 0–200)
EOS ABS: 120 {cells}/uL (ref 15–500)
Eosinophils Relative: 1 %
HCT: 38 % — ABNORMAL LOW (ref 38.5–50.0)
Hemoglobin: 12.7 g/dL — ABNORMAL LOW (ref 13.2–17.1)
LYMPHS PCT: 18 %
Lymphs Abs: 2160 cells/uL (ref 850–3900)
MCH: 27.1 pg (ref 27.0–33.0)
MCHC: 33.4 g/dL (ref 32.0–36.0)
MCV: 81.2 fL (ref 80.0–100.0)
MONO ABS: 720 {cells}/uL (ref 200–950)
MONOS PCT: 6 %
MPV: 10.8 fL (ref 7.5–12.5)
NEUTROS ABS: 9000 {cells}/uL — AB (ref 1500–7800)
Neutrophils Relative %: 75 %
PLATELETS: 395 10*3/uL (ref 140–400)
RBC: 4.68 MIL/uL (ref 4.20–5.80)
RDW: 14.3 % (ref 11.0–15.0)
WBC: 12 10*3/uL — AB (ref 3.8–10.8)

## 2016-01-01 LAB — POCT GLYCOSYLATED HEMOGLOBIN (HGB A1C): Hemoglobin A1C: 10.6

## 2016-01-01 MED ORDER — CARVEDILOL 12.5 MG PO TABS: 12.5000 mg | ORAL_TABLET | Freq: Two times a day (BID) | ORAL | 5 refills | Status: DC

## 2016-01-01 MED ORDER — METFORMIN HCL 1000 MG PO TABS: 1000.0000 mg | ORAL_TABLET | Freq: Two times a day (BID) | ORAL | 5 refills | Status: DC

## 2016-01-01 MED ORDER — GLIMEPIRIDE 4 MG PO TABS: ORAL_TABLET | ORAL | 5 refills | Status: DC

## 2016-01-01 MED ORDER — LISINOPRIL-HYDROCHLOROTHIAZIDE 20-12.5 MG PO TABS: 2.0000 | ORAL_TABLET | Freq: Every day | ORAL | 5 refills | Status: DC

## 2016-01-01 MED ORDER — ATORVASTATIN CALCIUM 20 MG PO TABS: ORAL_TABLET | ORAL | 5 refills | Status: DC

## 2016-01-01 MED ORDER — MELOXICAM 15 MG PO TABS: 15.0000 mg | ORAL_TABLET | Freq: Every day | ORAL | 0 refills | Status: DC

## 2016-01-01 NOTE — Patient Instructions (Addendum)
[employer] (Jardiance) [employer] ([place]) [employer] ([place]) [place])  If one of these companies doesn't have a prescription assistance program that works for you, the next choice is insulin.  Let me know what you find out and we can prescribe the medication.  It is extremely important that we get control of the diabetes. If we don't, your risk for heart attack and stroke is MUCH higher.  NO SWEETS: including cakes, pies, candy, sodas (diet sodas are OK). Limit: starches-pasta, potatoes, breads, rice       IF you received an x-ray today, you will receive an invoice from [facility]. Please contact [facility] at [phone] with questions or concerns regarding your invoice.   IF you received labwork today, you will receive an invoice from [employer]. Please contact [facility] at [phone] with questions or concerns regarding your invoice.   Our billing staff will not be able to assist you with questions regarding bills from these companies.  You will be contacted with the lab results as soon as they are available. The fastest way to get your results is to activate your My Chart account. Instructions are located on the last page of this paperwork. If you have not heard from [place] regarding the results in 2 weeks, please contact this office.

## 2016-01-01 NOTE — Progress Notes (Signed)
Patient ID: [name], male    DOB: [birth year], 40 y.o.   MRN: [id number]  PCP: Harrison Mons, PA-C; last seen here 04/2015 by provider no longer here.  Subjective:   Chief Complaint  Patient presents with  . Medication Refill    needs all medication on list refilled     HPI Presents for evaluation of several chronic medical problems: Diabetes, HTN, hyperlipidemia.  Out of carvedilol x 1-2 months. Nearly out of his other medications.  Wasn't able to afford the Jardiance. Wasn't able to afford Bydureon.  Currently unemployed. Has interview at [employer] [employer] tomorrow, hopes he'll hear in the next week.   Shoulder pain continues. Discussed at visit here 03/2015. Radiographs were normal. Was prescribed Naproxen, without benefit. Topicals (icy hot, pain away) without benefit. Xray was negative. Girlfriend is a CMA and suggested that he may need Prednisone. LEFT shoulder pain. Increased pain with lying on it, or letting it hang at his side. Improves if he raises the arm over his head. No neck pain. No paresthesias or radicular pain.  Review of Systems As above. No CP, SOB, HA, dizziness, GI/GU symptoms.    Patient Active Problem List   Diagnosis Date Noted  . Hyperglycemia   . Seizure ([place]) 03/25/2015  . Observed seizure-like activity ([place]) 03/25/2015  . 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 Diabetes type 2, uncontrolled ([place]) 04/21/2013  . Blurred vision 11/20/2011  . Obesity, Class II, BMI 35-39.9, with comorbidity ([place]) 11/19/2011  . Hypertension 05/11/2011  . Hyperlipidemia 05/11/2011  . CAD (coronary artery disease) 05/11/2011     Prior to Admission medications   Medication Sig Start Date End Date Taking? Authorizing Provider  aspirin 325 MG tablet Take 325 mg by mouth daily.   Yes Hayden Rasmussen, MD  atorvastatin (LIPITOR) 20 MG tablet Take 1 tablet by mouth at bedtime. 10/09/15  Yes Wardell Honour, MD  carvedilol (COREG) 12.5 MG tablet Take 1 tablet by mouth 2 (two)  times daily. 01/06/15  NO Historical Provider, MD  fish oil-omega-3 fatty acids 1000 MG capsule Take 2 g by mouth daily.   Yes Historical Provider, MD  glimepiride (AMARYL) 4 MG tablet Take 1 tablet twice a day before meals. 10/26/15  Yes Kirstie Larsen, PA-C  lisinopril-hydrochlorothiazide (PRINZIDE,ZESTORETIC) 20-12.5 MG tablet Take 2 tablets by mouth daily. 06/29/15  Yes Jaynee Eagles, PA-C  metFORMIN (GLUCOPHAGE) 1000 MG tablet Take 1 tablet (1,000 mg total) by mouth 2 (two) times daily with a meal. 11/02/15  Yes Lawarence Meek, PA-C  Multiple Vitamin (MULTIVITAMIN) tablet Take 1 tablet by mouth daily.   Yes Historical Provider, MD  nitroGLYCERIN (NITROSTAT) 0.4 MG SL tablet Place 1 tablet (0.4 mg total) under the tongue every 5 (five) minutes as needed for chest pain. REQUEST FUTURE REFILLS FROM PCP 09/13/14  Yes Pixie Casino, MD  nystatin (MYCOSTATIN) 100000 UNIT/ML suspension Take 5 mLs (500,000 Units total) by mouth 4 (four) times daily. 03/26/15  Yes Olam Idler, MD  cholecalciferol (VITAMIN D) 1000 UNITS tablet Take 1,000 Units by mouth daily.    Historical Provider, MD  naproxen sodium (ANAPROX DS) 550 MG tablet Take 1 tablet (550 mg total) by mouth 2 (two) times daily with a meal. Patient not taking: Reported on 01/01/2016 04/02/15 04/01/16  Roselee Culver, MD     No Known Allergies     Objective:  Physical Exam  Constitutional: He is oriented to person, place, and time. He appears well-developed and well-nourished. He is  active and cooperative. No distress.  BP (!) 166/102 (BP Location: Right Arm, Patient Position: Sitting, Cuff Size: Large)   Pulse (!) 105   Temp 98.9 F (37.2 C) (Oral)   Resp 17   Ht 5' 7.5" (1.715 m)   Wt 251 lb (113.9 kg)   SpO2 97%   BMI 38.73 kg/m   HENT:  Head: Normocephalic and atraumatic.  Right Ear: Hearing normal.  Left Ear: Hearing normal.  Eyes: Conjunctivae are normal. No scleral icterus.  Neck: Normal range of motion. Neck supple. No  thyromegaly present.  Cardiovascular: Normal rate, regular rhythm and normal heart sounds.   Pulses:      Radial pulses are 2+ on the right side, and 2+ on the left side.  Pulmonary/Chest: Effort normal and breath sounds normal.  Lymphadenopathy:       Head (right side): No tonsillar, no preauricular, no posterior auricular and no occipital adenopathy present.       Head (left side): No tonsillar, no preauricular, no posterior auricular and no occipital adenopathy present.    He has no cervical adenopathy.       Right: No supraclavicular adenopathy present.       Left: No supraclavicular adenopathy present.  Neurological: He is alert and oriented to person, place, and time. No sensory deficit.  Skin: Skin is warm, dry and intact. No rash noted. No cyanosis or erythema. Nails show no clubbing.  Psychiatric: He has a normal mood and affect. His speech is normal and behavior is normal.       Results for orders placed or performed in visit on 01/01/16  POCT glycosylated hemoglobin (Hb A1C)  Result Value Ref Range   Hemoglobin A1C 10.6        Assessment & Plan:   1. Uncontrolled type 2 diabetes mellitus with hyperglycemia, without long-term current use of insulin (HCC) Uncontrolled. Unable to afford additional oral agents. Not ready to start insulin. Understands that if he cannot enrol in a prescription assistance program for an oral med, and/or is not insured soon, he will need to start insulin or risk worsening control and end-organ damage.  - POCT glycosylated hemoglobin (Hb A1C) - Comprehensive metabolic panel - HM Diabetes Eye Exam - HM Diabetes Foot Exam - metFORMIN (GLUCOPHAGE) 1000 MG tablet; Take 1 tablet (1,000 mg total) by mouth 2 (two) times daily with a meal.  Dispense: 60 tablet; Refill: 5 - lisinopril-hydrochlorothiazide (PRINZIDE,ZESTORETIC) 20-12.5 MG tablet; Take 2 tablets by mouth daily.  Dispense: 60 tablet; Refill: 5 - glimepiride (AMARYL) 4 MG tablet; Take 1  tablet twice a day before meals.  Dispense: 60 tablet; Refill: 5  2. Essential hypertension Above goal today, off medication. Restart carvedilol and monitor. Likely untreated OSA contributes. - CBC with Differential/Platelet - Comprehensive metabolic panel - lisinopril-hydrochlorothiazide (PRINZIDE,ZESTORETIC) 20-12.5 MG tablet; Take 2 tablets by mouth daily.  Dispense: 60 tablet; Refill: 5 - carvedilol (COREG) 12.5 MG tablet; Take 1 tablet (12.5 mg total) by mouth 2 (two) times daily.  Dispense: 60 tablet; Refill: 5  3. Coronary artery disease due to lipid rich plaque Needs good control of OSA, glucose, HTN and lipids to reduce risk of another event.  Counseled at length. See above. - lisinopril-hydrochlorothiazide (PRINZIDE,ZESTORETIC) 20-12.5 MG tablet; Take 2 tablets by mouth daily.  Dispense: 60 tablet; Refill: 5 - carvedilol (COREG) 12.5 MG tablet; Take 1 tablet (12.5 mg total) by mouth 2 (two) times daily.  Dispense: 60 tablet; Refill: 5 - atorvastatin (LIPITOR) 20 MG tablet; Take  1 tablet by mouth at bedtime.  Dispense: 30 tablet; Refill: 5  4. Obesity, Class II, BMI 35-39.9, with comorbidity (HCC) Lifestyle changes: healthy eating choices and regular exercise for risk reduction.  5. Need for influenza vaccination Declined, due to cost  6. Need for pneumococcal vaccination Declined, due to cost  7. OSA (obstructive sleep apnea) Really needs to pursue evaluation and treatment for this condition as soon as he has insurance coverage.  8. Shoulder pain, left Trial of daily meloxicam. Will pursue additional evaluation/treatment once he is insured. - meloxicam (MOBIC) 15 MG tablet; Take 1 tablet (15 mg total) by mouth daily. For shoulder pain.  Dispense: 30 tablet; Refill: 0   Fara Chute, PA-C Physician Assistant-Certified [facility]

## 2016-01-02 LAB — COMPREHENSIVE METABOLIC PANEL
ALT: 21 U/L (ref 9–46)
AST: 19 U/L (ref 10–40)
Albumin: 3.4 g/dL — ABNORMAL LOW (ref 3.6–5.1)
Alkaline Phosphatase: 89 U/L (ref 40–115)
BUN: 18 mg/dL (ref 7–25)
CHLORIDE: 99 mmol/L (ref 98–110)
CO2: 27 mmol/L (ref 20–31)
Calcium: 9 mg/dL (ref 8.6–10.3)
Creat: 1.98 mg/dL — ABNORMAL HIGH (ref 0.60–1.35)
GLUCOSE: 270 mg/dL — AB (ref 65–99)
POTASSIUM: 3.5 mmol/L (ref 3.5–5.3)
Sodium: 140 mmol/L (ref 135–146)
Total Bilirubin: 0.4 mg/dL (ref 0.2–1.2)
Total Protein: 6.3 g/dL (ref 6.1–8.1)

## 2016-01-03 ENCOUNTER — Encounter: Payer: Self-pay | Admitting: Physician Assistant

## 2016-02-13 ENCOUNTER — Other Ambulatory Visit: Payer: Self-pay | Admitting: Physician Assistant

## 2016-02-13 DIAGNOSIS — M25512 Pain in left shoulder: Secondary | ICD-10-CM

## 2016-02-14 ENCOUNTER — Ambulatory Visit (INDEPENDENT_AMBULATORY_CARE_PROVIDER_SITE_OTHER): Payer: Self-pay | Admitting: Family Medicine

## 2016-02-14 VITALS — BP 158/88 | HR 97 | Temp 98.3°F | Resp 16 | Ht 67.0 in | Wt 250.0 lb

## 2016-02-14 DIAGNOSIS — I10 Essential (primary) hypertension: Secondary | ICD-10-CM

## 2016-02-14 DIAGNOSIS — R7989 Other specified abnormal findings of blood chemistry: Secondary | ICD-10-CM

## 2016-02-14 DIAGNOSIS — E118 Type 2 diabetes mellitus with unspecified complications: Secondary | ICD-10-CM

## 2016-02-14 NOTE — Progress Notes (Signed)
[name] is a 40 y.o. male who presents to [facility] today for hypertension and diabetes:    1.  Hypertension:  Long-term problem for this patient.  No adverse effects from medication.  Not checking it regularly.  No HA, CP, dizziness, shortness of breath, palpitations, or LE swelling.  Has been regularly uncontrolled in past.  Not taking regularly.  Has been out of his combination medicine and carvedilol for several days as he couldn't afford his refills.   BP Readings from Last 3 Encounters:  02/14/16 (!) 186/98  01/01/16 (!) 166/102  04/02/15 (!) 136/94   2.  Diabetes:  Currently on Metformin and Glimepiride. However he has not been taking his medications regularly. He has trouble refilling them due to finances. He also reports going most days a week without as he thought he had to take this 30 minutes before meals and could not take it if he forgot and ate. No hypoglycemic events.  No paresthesia or peripheral nerve pain.  Measures blood sugars at home every:    Few days.  States they run in mid to high 200s.   Lab Results  Component Value Date   HGBA1C 10.6 01/01/2016     ROS as above.  Pertinently, no chest pain, palpitations, SOB, Fever, Chills, Abd pain, N/V/D.   PMH reviewed. Patient is a nonsmoker.   Past Medical History:  Diagnosis Date  . CAD (coronary artery disease)   . Diabetes mellitus   . Hyperlipidemia   . Hypertension    Past Surgical History:  Procedure Laterality Date  . CORONARY ARTERY BYPASS GRAFT      Medications reviewed. Current Outpatient Prescriptions  Medication Sig Dispense Refill  . aspirin 325 MG tablet Take 325 mg by mouth daily.    Marland Kitchen atorvastatin (LIPITOR) 20 MG tablet Take 1 tablet by mouth at bedtime. 30 tablet 5  . carvedilol (COREG) 12.5 MG tablet Take 1 tablet (12.5 mg total) by mouth 2 (two) times daily. 60 tablet 5  . cholecalciferol (VITAMIN D) 1000 UNITS tablet Take 1,000 Units by mouth daily.    . fish  oil-omega-3 fatty acids 1000 MG capsule Take 2 g by mouth daily.    Marland Kitchen glimepiride (AMARYL) 4 MG tablet Take 1 tablet twice a day before meals. 60 tablet 5  . lisinopril-hydrochlorothiazide (PRINZIDE,ZESTORETIC) 20-12.5 MG tablet Take 2 tablets by mouth daily. 60 tablet 5  . meloxicam (MOBIC) 15 MG tablet Take 1 tablet (15 mg total) by mouth daily. For shoulder pain. 30 tablet 0  . metFORMIN (GLUCOPHAGE) 1000 MG tablet Take 1 tablet (1,000 mg total) by mouth 2 (two) times daily with a meal. 60 tablet 5  . Multiple Vitamin (MULTIVITAMIN) tablet Take 1 tablet by mouth daily.    . nitroGLYCERIN (NITROSTAT) 0.4 MG SL tablet Place 1 tablet (0.4 mg total) under the tongue every 5 (five) minutes as needed for chest pain. REQUEST FUTURE REFILLS FROM PCP 25 tablet 0   No current facility-administered medications for this visit.      Physical Exam:  BP (!) 158/88 (BP Location: Left Arm, Cuff Size: Normal)   Pulse 97   Temp 98.3 F (36.8 C) (Oral)   Resp 16   Ht 5\' 7"  (1.702 m)   Wt 250 lb (113.4 kg)   SpO2 100%   BMI 39.16 kg/m  Gen:  Alert, cooperative patient who appears stated age in no acute distress.  Vital signs reviewed. HEENT: EOMI,  MMM Pulm:  Clear to auscultation bilaterally  Cardiac:  Regular rate and rhythm  Abd:  Soft/nondistended/nontender.  Good bowel sounds  Exts: Trace LE edema.   Assessment and Plan:  1.  HTN: - Not controlled. -Recheck here little better. -He has not been taking this regularly. Even with this regularly his medications were not maxed out. Plan will be to increase-however last metabolic profile showed elevated creatinine.  2.  Elevated creatinine:   -Recheck  this today. If his creatinine is improved can increase his lisinopril hydrochlorothiazide combination. Follow-up in 4 weeks to assess for continued blood pressure adherence and improvement. -I have given him information on the medication assistance program through the [facility]  Department.  3.  DM2, uncontrolled: - He has also been fairly nonadherent to his diabetic medications. -He does not need any further refills just has trouble affording them. -He also is not taking his glyburide very often as he thinks he has to strictly take this 30 minutes before meals. Discussed with him that the medial(s) prevent hypoglycemia and it is better to take this even if he is already eaten to promote adherence.  -Follow-up in 4-6 weeks may need to increase his glimepiride at that appointment.

## 2016-02-14 NOTE — Patient Instructions (Addendum)
Take the combination blood pressure pills as you have been doing.    Take the Glimepiride and Metformin together to help you remember this.    Investigate the MAP program.  They should be able to help you with your medications.    Come back for a recheck in the next 4 - 6 weeks to see how you're doing.      IF you received an x-ray today, you will receive an invoice from [facility]. Please contact [facility] Radiology at [phone] with questions or concerns regarding your invoice.   IF you received labwork today, you will receive an invoice from [employer]. Please contact [facility] at [phone] with questions or concerns regarding your invoice.   Our billing staff will not be able to assist you with questions regarding bills from these companies.  You will be contacted with the lab results as soon as they are available. The fastest way to get your results is to activate your My Chart account. Instructions are located on the last page of this paperwork. If you have not heard from [place] regarding the results in 2 weeks, please contact this office.

## 2016-02-15 LAB — SPECIMEN STATUS REPORT

## 2016-02-15 NOTE — Telephone Encounter (Signed)
Meds ordered this encounter  Medications  . meloxicam (MOBIC) 15 MG tablet    Sig: TAKE ONE TABLET BY MOUTH ONCE DAILY FOR  SHOULDER  PAIN    Dispense:  30 tablet    Refill:  0    Please consider 90 day supplies to promote better adherence

## 2016-02-15 NOTE — Addendum Note (Signed)
Addended by: Jannette Spanner on: 02/15/2016 04:10 PM   Modules accepted: Orders

## 2016-02-16 LAB — COMPREHENSIVE METABOLIC PANEL
ALK PHOS: 69 U/L (ref 40–115)
ALT: 20 U/L (ref 9–46)
AST: 20 U/L (ref 10–40)
Albumin: 3.5 g/dL — ABNORMAL LOW (ref 3.6–5.1)
BUN: 33 mg/dL — AB (ref 7–25)
CO2: 22 mmol/L (ref 20–31)
CREATININE: 2.3 mg/dL — AB (ref 0.60–1.35)
Calcium: 9.3 mg/dL (ref 8.6–10.3)
Chloride: 102 mmol/L (ref 98–110)
Glucose, Bld: 102 mg/dL — ABNORMAL HIGH (ref 65–99)
Potassium: 3.7 mmol/L (ref 3.5–5.3)
SODIUM: 139 mmol/L (ref 135–146)
TOTAL PROTEIN: 6.4 g/dL (ref 6.1–8.1)
Total Bilirubin: 0.4 mg/dL (ref 0.2–1.2)

## 2016-02-18 ENCOUNTER — Other Ambulatory Visit: Payer: Self-pay | Admitting: Family Medicine

## 2016-02-18 DIAGNOSIS — R7989 Other specified abnormal findings of blood chemistry: Secondary | ICD-10-CM

## 2016-02-29 ENCOUNTER — Other Ambulatory Visit: Payer: Self-pay | Admitting: Physician Assistant

## 2016-03-04 ENCOUNTER — Other Ambulatory Visit: Payer: Self-pay | Admitting: Internal Medicine

## 2016-03-17 ENCOUNTER — Encounter (INDEPENDENT_AMBULATORY_CARE_PROVIDER_SITE_OTHER): Payer: Self-pay | Admitting: Ophthalmology

## 2016-04-16 ENCOUNTER — Telehealth: Payer: Self-pay

## 2016-04-16 NOTE — Telephone Encounter (Signed)
Pt is wanitng to let chelle know that he now has insurance and is wanting her to go ahead and call in an RX of [facility]   Best number [phone]

## 2016-04-18 MED ORDER — EMPAGLIFLOZIN 10 MG PO TABS: 10.0000 mg | ORAL_TABLET | Freq: Every day | ORAL | 3 refills | Status: DC

## 2016-04-18 NOTE — Telephone Encounter (Signed)
Meds ordered this encounter  Medications  . empagliflozin (JARDIANCE) 10 MG TABS tablet    Sig: Take 10 mg by mouth daily.    Dispense:  90 tablet    Refill:  3    Order Specific Question:   Supervising Provider    Answer:   Brigitte Pulse, EVA N [[id number]]

## 2016-06-09 ENCOUNTER — Ambulatory Visit (HOSPITAL_COMMUNITY)
Admission: EM | Admit: 2016-06-09 | Discharge: 2016-06-09 | Disposition: A | Discharge status: Home / Self Care | Payer: BLUE CROSS/BLUE SHIELD | Attending: Family Medicine | Admitting: Family Medicine

## 2016-06-09 ENCOUNTER — Encounter (HOSPITAL_COMMUNITY): Payer: Self-pay | Admitting: Emergency Medicine

## 2016-06-09 DIAGNOSIS — J069 Acute upper respiratory infection, unspecified: Secondary | ICD-10-CM | POA: Diagnosis not present

## 2016-06-09 MED ORDER — IPRATROPIUM BROMIDE 0.06 % NA SOLN: 2.0000 | Freq: Four times a day (QID) | NASAL | 2 refills | Status: DC

## 2016-06-09 NOTE — Discharge Instructions (Signed)
Drink plenty of fluids as discussed, use medicine as prescribed, and mucinex or delsym for cough. Return or see your doctor if further problems °

## 2016-06-09 NOTE — ED Provider Notes (Signed)
[place]    CSN: [id number] Arrival date & time: 06/09/16  1636     History   Chief Complaint Chief Complaint  Patient presents with  . Sore Throat    HPI RACHAEL ZAPANTA is a 41 y.o. male.   The history is provided by the patient.  Sore Throat  This is a new problem. The current episode started more than 2 days ago. The problem has been gradually worsening. Pertinent negatives include no chest pain, no abdominal pain, no headaches and no shortness of breath. The symptoms are aggravated by swallowing.    Past Medical History:  Diagnosis Date  . CAD (coronary artery disease)   . Diabetes mellitus   . Hyperlipidemia   . Hypertension     Patient Active Problem List   Diagnosis Date Noted  . Hyperglycemia   . Seizure ([place]) 03/25/2015  . Observed seizure-like activity ([place]) 03/25/2015  . 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 Diabetes type 2, uncontrolled ([place]) 04/21/2013  . Blurred vision 11/20/2011  . Obesity, Class II, BMI 35-39.9, with comorbidity 11/19/2011  . Hypertension 05/11/2011  . Hyperlipidemia 05/11/2011  . CAD (coronary artery disease) 05/11/2011    Past Surgical History:  Procedure Laterality Date  . CORONARY ARTERY BYPASS GRAFT         Home Medications    Prior to Admission medications   Medication Sig Start Date End Date Taking? Authorizing Provider  aspirin 325 MG tablet Take 325 mg by mouth daily.   Yes Hayden Rasmussen, MD  atorvastatin (LIPITOR) 20 MG tablet Take 1 tablet by mouth at bedtime. 01/01/16  Yes Chelle Jeffery, PA-C  carvedilol (COREG) 12.5 MG tablet Take 1 tablet (12.5 mg total) by mouth 2 (two) times daily. 01/01/16  Yes Chelle Jeffery, PA-C  cholecalciferol (VITAMIN D) 1000 UNITS tablet Take 1,000 Units by mouth daily.   Yes Historical Provider, MD  empagliflozin (JARDIANCE) 10 MG TABS tablet Take 10 mg by mouth daily. 04/18/16  Yes Chelle Jeffery, PA-C  fish oil-omega-3 fatty acids 1000 MG capsule Take 2 g by mouth daily.    Yes Historical Provider, MD  glimepiride (AMARYL) 4 MG tablet Take 1 tablet twice a day before meals. 01/01/16  Yes Chelle Jeffery, PA-C  lisinopril-hydrochlorothiazide (PRINZIDE,ZESTORETIC) 20-12.5 MG tablet Take 2 tablets by mouth daily. 01/01/16  Yes Chelle Jeffery, PA-C  metFORMIN (GLUCOPHAGE) 1000 MG tablet Take 1 tablet (1,000 mg total) by mouth 2 (two) times daily with a meal. 01/01/16  Yes Chelle Jeffery, PA-C  Multiple Vitamin (MULTIVITAMIN) tablet Take 1 tablet by mouth daily.   Yes Historical Provider, MD  ipratropium (ATROVENT) 0.06 % nasal spray Place 2 sprays into both nostrils 4 (four) times daily. 06/09/16   Billy Fischer, MD  meloxicam (MOBIC) 15 MG tablet TAKE ONE TABLET BY MOUTH ONCE DAILY FOR  SHOULDER  PAIN 02/15/16   Chelle Jeffery, PA-C  NITROSTAT 0.4 MG SL tablet DISSOLVE ONE TABLET UNDER THE TONGUE EVERY 5 MINUTES AS NEEDED FOR CHEST PAIN.  DO NOT EXCEED A TOTAL OF 3 DOSES IN 15 MINUTES 03/04/16   Pixie Casino, MD    Family History Family History  Problem Relation Age of Onset  . Hypertension Mother   . Heart disease Father   . Hypertension Father   . Diabetes Father   . Hyperlipidemia Father     Social History Social History  Substance Use Topics  . Smoking status: Former Smoker    Quit date: 09/17/2008  .  Smokeless tobacco: Never Used  . Alcohol use Yes     Comment: drink beer occas. (4-5 a week)     Allergies   Patient has no known allergies.   Review of Systems Review of Systems  Constitutional: Negative.   HENT: Positive for congestion, postnasal drip, rhinorrhea and sore throat.   Respiratory: Negative.  Negative for shortness of breath.   Cardiovascular: Negative for chest pain.  Gastrointestinal: Negative.  Negative for abdominal pain.  Neurological: Negative for headaches.  All other systems reviewed and are negative.    Physical Exam Triage Vital Signs ED Triage Vitals [06/09/16 1730]  Enc Vitals Group     BP 194/89     Pulse Rate 88       Resp      Temp 99.1 F (37.3 C)     Temp Source Oral     SpO2 99 %     Weight      Height      Head Circumference      Peak Flow      Pain Score 5     Pain Loc      Pain Edu?      Excl. in [place]?    No data found.   Updated Vital Signs BP 194/89 (BP Location: Right Arm)   Pulse 88   Temp 99.1 F (37.3 C) (Oral)   SpO2 99%   Visual Acuity Right Eye Distance:   Left Eye Distance:   Bilateral Distance:    Right Eye Near:   Left Eye Near:    Bilateral Near:     Physical Exam  Constitutional: He is oriented to person, place, and time. He appears well-developed and well-nourished.  HENT:  Right Ear: External ear normal.  Left Ear: External ear normal.  Nose: Nose normal.  Mouth/Throat: Oropharynx is clear and moist.  Eyes: Pupils are equal, round, and reactive to light.  Neck: Normal range of motion.  Pulmonary/Chest: Effort normal and breath sounds normal.  Musculoskeletal: Normal range of motion.  Lymphadenopathy:    He has no cervical adenopathy.  Neurological: He is alert and oriented to person, place, and time.  Nursing note and vitals reviewed.    UC Treatments / Results  Labs (all labs ordered are listed, but only abnormal results are displayed) Labs Reviewed - No data to display  EKG  EKG Interpretation None       Radiology No results found.  Procedures Procedures (including critical care time)  Medications Ordered in UC Medications - No data to display   Initial Impression / Assessment and Plan / UC Course  I have reviewed the triage vital signs and the nursing notes.  Pertinent labs & imaging results that were available during my care of the patient were reviewed by me and considered in my medical decision making (see chart for details).       Final Clinical Impressions(s) / UC Diagnoses   Final diagnoses:  Upper respiratory tract infection, unspecified type    New Prescriptions New Prescriptions   IPRATROPIUM (ATROVENT)  0.06 % NASAL SPRAY    Place 2 sprays into both nostrils 4 (four) times daily.     Billy Fischer, MD 06/09/16 Tresa Moore

## 2016-06-09 NOTE — ED Triage Notes (Signed)
Pt had a cold several weeks ago and is still suffering from PND and a sore throat.

## 2016-06-16 ENCOUNTER — Encounter (INDEPENDENT_AMBULATORY_CARE_PROVIDER_SITE_OTHER): Payer: BLUE CROSS/BLUE SHIELD | Admitting: Ophthalmology

## 2016-06-16 DIAGNOSIS — E10311 Type 1 diabetes mellitus with unspecified diabetic retinopathy with macular edema: Secondary | ICD-10-CM

## 2016-06-16 DIAGNOSIS — E103513 Type 1 diabetes mellitus with proliferative diabetic retinopathy with macular edema, bilateral: Secondary | ICD-10-CM

## 2016-06-16 DIAGNOSIS — H2513 Age-related nuclear cataract, bilateral: Secondary | ICD-10-CM

## 2016-06-16 DIAGNOSIS — H3413 Central retinal artery occlusion, bilateral: Secondary | ICD-10-CM | POA: Diagnosis not present

## 2016-06-16 DIAGNOSIS — H43813 Vitreous degeneration, bilateral: Secondary | ICD-10-CM | POA: Diagnosis not present

## 2016-06-18 ENCOUNTER — Ambulatory Visit: Payer: BLUE CROSS/BLUE SHIELD

## 2016-06-30 ENCOUNTER — Encounter (INDEPENDENT_AMBULATORY_CARE_PROVIDER_SITE_OTHER): Payer: BLUE CROSS/BLUE SHIELD | Admitting: Ophthalmology

## 2016-06-30 DIAGNOSIS — E103513 Type 1 diabetes mellitus with proliferative diabetic retinopathy with macular edema, bilateral: Secondary | ICD-10-CM

## 2016-06-30 DIAGNOSIS — E10311 Type 1 diabetes mellitus with unspecified diabetic retinopathy with macular edema: Secondary | ICD-10-CM | POA: Diagnosis not present

## 2016-07-25 ENCOUNTER — Other Ambulatory Visit (INDEPENDENT_AMBULATORY_CARE_PROVIDER_SITE_OTHER): Payer: BLUE CROSS/BLUE SHIELD | Admitting: Ophthalmology

## 2016-08-03 ENCOUNTER — Other Ambulatory Visit: Payer: Self-pay | Admitting: Physician Assistant

## 2016-08-03 DIAGNOSIS — I2510 Atherosclerotic heart disease of native coronary artery without angina pectoris: Secondary | ICD-10-CM

## 2016-08-03 DIAGNOSIS — I10 Essential (primary) hypertension: Secondary | ICD-10-CM

## 2016-08-03 DIAGNOSIS — I2583 Coronary atherosclerosis due to lipid rich plaque: Secondary | ICD-10-CM

## 2016-08-03 DIAGNOSIS — E1165 Type 2 diabetes mellitus with hyperglycemia: Secondary | ICD-10-CM

## 2016-08-05 ENCOUNTER — Other Ambulatory Visit: Payer: Self-pay | Admitting: Internal Medicine

## 2016-08-15 ENCOUNTER — Other Ambulatory Visit (INDEPENDENT_AMBULATORY_CARE_PROVIDER_SITE_OTHER): Payer: BLUE CROSS/BLUE SHIELD | Admitting: Ophthalmology

## 2016-08-15 DIAGNOSIS — H4313 Vitreous hemorrhage, bilateral: Secondary | ICD-10-CM

## 2016-08-15 DIAGNOSIS — E113511 Type 2 diabetes mellitus with proliferative diabetic retinopathy with macular edema, right eye: Secondary | ICD-10-CM | POA: Diagnosis not present

## 2016-08-15 DIAGNOSIS — E11311 Type 2 diabetes mellitus with unspecified diabetic retinopathy with macular edema: Secondary | ICD-10-CM

## 2016-08-18 ENCOUNTER — Ambulatory Visit (INDEPENDENT_AMBULATORY_CARE_PROVIDER_SITE_OTHER): Payer: BLUE CROSS/BLUE SHIELD | Admitting: Physician Assistant

## 2016-08-18 ENCOUNTER — Encounter: Payer: Self-pay | Admitting: Physician Assistant

## 2016-08-18 VITALS — BP 186/100 | HR 80 | Temp 98.4°F | Resp 16 | Ht 66.0 in | Wt 242.4 lb

## 2016-08-18 DIAGNOSIS — R29818 Other symptoms and signs involving the nervous system: Secondary | ICD-10-CM

## 2016-08-18 DIAGNOSIS — Z6839 Body mass index (BMI) 39.0-39.9, adult: Secondary | ICD-10-CM | POA: Diagnosis not present

## 2016-08-18 DIAGNOSIS — E785 Hyperlipidemia, unspecified: Secondary | ICD-10-CM | POA: Diagnosis not present

## 2016-08-18 DIAGNOSIS — I2510 Atherosclerotic heart disease of native coronary artery without angina pectoris: Secondary | ICD-10-CM

## 2016-08-18 DIAGNOSIS — H538 Other visual disturbances: Secondary | ICD-10-CM | POA: Diagnosis not present

## 2016-08-18 DIAGNOSIS — I2583 Coronary atherosclerosis due to lipid rich plaque: Secondary | ICD-10-CM | POA: Diagnosis not present

## 2016-08-18 DIAGNOSIS — E1165 Type 2 diabetes mellitus with hyperglycemia: Secondary | ICD-10-CM

## 2016-08-18 DIAGNOSIS — I10 Essential (primary) hypertension: Secondary | ICD-10-CM | POA: Diagnosis not present

## 2016-08-18 LAB — POCT URINALYSIS DIP (MANUAL ENTRY)
Bilirubin, UA: NEGATIVE
GLUCOSE UA: NEGATIVE mg/dL
Ketones, POC UA: NEGATIVE mg/dL
LEUKOCYTES UA: NEGATIVE
Nitrite, UA: NEGATIVE
SPEC GRAV UA: 1.02 (ref 1.010–1.025)
UROBILINOGEN UA: 0.2 U/dL
pH, UA: 5 (ref 5.0–8.0)

## 2016-08-18 LAB — POCT GLYCOSYLATED HEMOGLOBIN (HGB A1C): Hemoglobin A1C: 7.5

## 2016-08-18 LAB — GLUCOSE, POCT (MANUAL RESULT ENTRY): POC Glucose: 46 mg/dl — AB (ref 70–99)

## 2016-08-18 MED ORDER — BLOOD GLUCOSE MONITOR KIT: PACK | 0 refills | Status: DC

## 2016-08-18 MED ORDER — HYDROCHLOROTHIAZIDE 12.5 MG PO CAPS: 12.5000 mg | ORAL_CAPSULE | Freq: Every day | ORAL | 0 refills | Status: DC

## 2016-08-18 NOTE — Assessment & Plan Note (Signed)
Glucose here today is low, but A1C is >7%. He will check home glucose fasting and 2-3 hour post-prandial and bring in his log in 7-10 days. Reluctant to increase regimen, or refill Jardiance, due to frequent episodes of hypoglycemia. However, needs good control to reduce risk of cardiovascular event.

## 2016-08-18 NOTE — Assessment & Plan Note (Signed)
Needs follow-up with cardiology. Never had echocardiogram ordered 12/2014.

## 2016-08-18 NOTE — Progress Notes (Signed)
Patient ID: [name], male    DOB: 26-Aug-1975, 41 y.o.   MRN: [id number]  PCP: Harrison Mons, PA-C  Chief Complaint  Patient presents with  . Hypertension    after looking at pt's BP, he stated night sweats   . Diabetes  . Medication Refill    Jardiance 10 mg    Subjective:   Presents for evaluation of HTN, diabetes and medication refill.  Sweats x 1-2 weeks.  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  No CP, SOB, HA, dizziness. No slurred speech, weakness.  "I think I need to see a sleep specialist." Has been referred previously, due to witnessed apnea, but didn't have insurance so was not able to go.  Home glucose readings are 54, 60, 160. Requests a new meter.  Not taking Jardiance x 10 days due to cost. Reports missing no doses of lisinopril or carvedilol.   Review of Systems  Constitutional: Positive for diaphoresis. Negative for activity change, appetite change, chills, fatigue, fever and unexpected weight change.  HENT: Negative.   Eyes: Negative.   Respiratory: Positive for apnea. Negative for cough, choking, chest tightness, shortness of breath, wheezing and stridor.   Cardiovascular: Negative for chest pain, palpitations and leg swelling.  Gastrointestinal: Negative.   Endocrine: Negative for cold intolerance, heat intolerance, polydipsia, polyphagia and polyuria.  Genitourinary: Negative.   Musculoskeletal: Negative.   Allergic/Immunologic: Negative.   Neurological: Negative for dizziness, tremors, seizures, syncope, facial asymmetry, speech difficulty, weakness, light-headedness, numbness and headaches.  Hematological: Negative.   Psychiatric/Behavioral: Negative.        Patient Active Problem List   Diagnosis Date Noted  . Hyperglycemia   . Seizure ([place]) 03/25/2015  . Observed seizure-like activity ([place]) 03/25/2015  .  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 Diabetes type 2, uncontrolled ([place]) 04/21/2013  . Blurred vision 11/20/2011  . Obesity, Class II, BMI 35-39.9, with comorbidity 11/19/2011  . Hypertension 05/11/2011  . Hyperlipidemia 05/11/2011  . CAD (coronary artery disease) 05/11/2011     Prior to Admission medications   Medication Sig Start Date End Date Taking? Authorizing Provider  aspirin 325 MG tablet Take 325 mg by mouth daily.   Yes Hayden Rasmussen, MD  atorvastatin (LIPITOR) 20 MG tablet Take 1 tablet by mouth at bedtime. 01/01/16  Yes Seairra Otani, PA-C  carvedilol (COREG) 12.5 MG tablet Take 1 tablet (12.5 mg total) by mouth 2 (two) times daily. 01/01/16  Yes Kevron Patella, PA-C  empagliflozin (JARDIANCE) 10 MG TABS tablet Take 10 mg by mouth daily. 04/18/16  Yes Camile Esters, PA-C  fish oil-omega-3 fatty acids 1000 MG capsule Take 2 g by mouth daily.   Yes [provider]  glimepiride (AMARYL) 4 MG tablet Take 1 tablet twice a day before meals. 01/01/16  Yes Raynette Arras, PA-C  lisinopril-hydrochlorothiazide (PRINZIDE,ZESTORETIC) 20-12.5 MG tablet TAKE TWO TABLETS BY MOUTH ONCE DAILY 08/04/16  Yes Tenoch Mcclure, PA-C  metFORMIN (GLUCOPHAGE) 1000 MG tablet Take 1 tablet (1,000 mg total) by mouth 2 (two) times daily with a meal. 01/01/16  Yes Bintou Lafata, PA-C  Multiple Vitamin (MULTIVITAMIN) tablet Take 1 tablet by mouth daily.   Yes [provider]  nitroGLYCERIN (NITROSTAT) 0.4 MG SL tablet DISSOLVE ONE TABLET UNDER THE TONGUE EVERY 5 MINUTES AS NEEDED FOR CHEST PAIN.  DO NOT EXCEED A TOTAL OF 3 DOSES IN 15  MINUTES 08/06/16  Yes Hilty, Nadean Corwin, MD  cholecalciferol (VITAMIN D) 1000 UNITS tablet Take 1,000 Units by mouth daily.    [provider]  ipratropium (ATROVENT) 0.06 % nasal spray Place 2 sprays into both nostrils 4 (four) times daily. Patient not taking: Reported on 08/18/2016 06/09/16   Billy Fischer, MD  meloxicam (MOBIC) 15 MG tablet TAKE ONE TABLET BY  MOUTH ONCE DAILY FOR  SHOULDER  PAIN Patient not taking: Reported on 08/18/2016 02/15/16   Harrison Mons, PA-C     No Known Allergies     Objective:  Physical Exam  Constitutional: He is oriented to person, place, and time. He appears well-developed and well-nourished. He is active and cooperative. No distress.  BP (!) 186/100 (BP Location: Left Arm, Patient Position: Sitting, Cuff Size: Large)   Pulse 80   Temp 98.4 F (36.9 C) (Oral)   Resp 16   Ht '5\' 6"'$  (1.676 m)   Wt 242 lb 6.4 oz (110 kg)   SpO2 100%   BMI 39.12 kg/m   HENT:  Head: Normocephalic and atraumatic.  Right Ear: Hearing normal.  Left Ear: Hearing normal.  Eyes: Conjunctivae are normal. No scleral icterus.  Neck: Normal range of motion. Neck supple. No thyromegaly present.  Cardiovascular: Normal rate, regular rhythm and normal heart sounds.   Pulses:      Radial pulses are 2+ on the right side, and 2+ on the left side.  Pulmonary/Chest: Effort normal and breath sounds normal.  Lymphadenopathy:       Head (right side): No tonsillar, no preauricular, no posterior auricular and no occipital adenopathy present.       Head (left side): No tonsillar, no preauricular, no posterior auricular and no occipital adenopathy present.    He has no cervical adenopathy.       Right: No supraclavicular adenopathy present.       Left: No supraclavicular adenopathy present.  Neurological: He is alert and oriented to person, place, and time. No sensory deficit.  Skin: Skin is warm, dry and intact. No rash noted. No cyanosis or erythema. Nails show no clubbing.  Psychiatric: He has a normal mood and affect. His speech is normal and behavior is normal.    Diabetic Foot Exam - Simple   Simple Foot Form Diabetic Foot exam was performed with the following findings:  Yes 08/18/2016  3:27 PM  Visual Inspection No deformities, no ulcerations, no other skin breakdown bilaterally:  Yes Sensation Testing Intact to touch and monofilament  testing bilaterally:  Yes Pulse Check Comments Pedal pulses felt on foot exam      Results for orders placed or performed in visit on 08/18/16  POCT glycosylated hemoglobin (Hb A1C)  Result Value Ref Range   Hemoglobin A1C 7.5   POCT glucose (manual entry)  Result Value Ref Range   POC Glucose 46 (A) 70 - 99 mg/dl  POCT urinalysis dipstick  Result Value Ref Range   Color, UA yellow yellow   Clarity, UA clear clear   Glucose, UA negative negative mg/dL   Bilirubin, UA negative negative   Ketones, POC UA negative negative mg/dL   Spec Grav, UA 1.020 1.010 - 1.025   Blood, UA trace-intact (A) negative   pH, UA 5.0 5.0 - 8.0   Protein Ur, POC >=300 (A) negative mg/dL   Urobilinogen, UA 0.2 0.2 or 1.0 E.U./dL   Nitrite, UA Negative Negative   Leukocytes, UA Negative Negative       Assessment &  Plan:   Problem List Items Addressed This Visit    Hypertension    Uncontrolled. Likely exacerbated by untreated OSA. Refer to Sleep Medicine. Add additional 12.5 mg of HCTZ to current regimen. Check home BP daily and return for review in 7-10 days. Anticipate increase in lisinopril.       Relevant Medications   hydrochlorothiazide (MICROZIDE) 12.5 MG capsule   Other Relevant Orders   Comprehensive metabolic panel   CBC with Differential/Platelet   TSH   T4, free   POCT urinalysis dipstick (Completed)   Ambulatory referral to Cardiology   Hyperlipidemia    Await labs. Adjust regimen as indicated by results.       Relevant Medications   hydrochlorothiazide (MICROZIDE) 12.5 MG capsule   Other Relevant Orders   Comprehensive metabolic panel   Lipid panel   CAD (coronary artery disease)    Needs follow-up with cardiology. Never had echocardiogram ordered 12/2014.      Relevant Medications   hydrochlorothiazide (MICROZIDE) 12.5 MG capsule   BMI 39.0-39.9,adult - Primary    Contributes to HTN, OSA and diabetes. Refer to weight loss clinic.      Relevant Orders   Amb Ref  to Medical Weight Management   Blurred vision    Proceed per eye specialists.      Diabetes type 2, uncontrolled (HCC)    Glucose here today is low, but A1C is >7%. He will check home glucose fasting and 2-3 hour post-prandial and bring in his log in 7-10 days. Reluctant to increase regimen, or refill Jardiance, due to frequent episodes of hypoglycemia. However, needs good control to reduce risk of cardiovascular event.      Relevant Medications   blood glucose meter kit and supplies KIT   Other Relevant Orders   HM Diabetes Foot Exam (Completed)   Comprehensive metabolic panel   POCT glycosylated hemoglobin (Hb A1C) (Completed)   POCT glucose (manual entry) (Completed)   Microalbumin, urine   Suspected sleep apnea    Refer to Sleep Medicine.      Relevant Orders   Ambulatory referral to Sleep Studies       Return for re-evaluation in 7-10 days.   Fara Chute, PA-C [facility]

## 2016-08-18 NOTE — Assessment & Plan Note (Signed)
Await labs. Adjust regimen as indicated by results.  

## 2016-08-18 NOTE — Patient Instructions (Addendum)
ADD the HCTZ 12.5 mg to the medications you are already taking. I expect that we will need to add to your medications again. Please check your blood pressure daily, if you can, and bring the log in with you to see me in 7-10 days.  Referrals: -cardiology -sleep medicine -weight management  Your glucose is low here today, but the A1C shows that you have high readings, too. Please check your glucose twice a day until you see me again. Check it in the mornings before your breakfast, and again 2-3 hours after your largest meal of the day (usually the evening meal). Record the results and we'll review them at your next visit to determine how to adjust your regimen.    IF you received an x-ray today, you will receive an invoice from [facility]. Please contact [facility] Radiology at [phone] with questions or concerns regarding your invoice.   IF you received labwork today, you will receive an invoice from [facility]. Please contact [facility] at [phone] with questions or concerns regarding your invoice.   Our billing staff will not be able to assist you with questions regarding bills from these companies.  You will be contacted with the lab results as soon as they are available. The fastest way to get your results is to activate your My Chart account. Instructions are located on the last page of this paperwork. If you have not heard from [place] regarding the results in 2 weeks, please contact this office.

## 2016-08-18 NOTE — Assessment & Plan Note (Signed)
Uncontrolled. Likely exacerbated by untreated OSA. Refer to Sleep Medicine. Add additional 12.5 mg of HCTZ to current regimen. Check home BP daily and return for review in 7-10 days. Anticipate increase in lisinopril.

## 2016-08-18 NOTE — Assessment & Plan Note (Signed)
Refer to Sleep Medicine.

## 2016-08-18 NOTE — Assessment & Plan Note (Signed)
Contributes to HTN, OSA and diabetes. Refer to weight loss clinic.

## 2016-08-18 NOTE — Assessment & Plan Note (Signed)
Proceed per eye specialists.

## 2016-08-19 ENCOUNTER — Telehealth: Payer: Self-pay | Admitting: Physician Assistant

## 2016-08-19 LAB — LIPID PANEL
Chol/HDL Ratio: 5.1 ratio — ABNORMAL HIGH (ref 0.0–5.0)
Cholesterol, Total: 195 mg/dL (ref 100–199)
HDL: 38 mg/dL — AB (ref >39)
LDL CALC: 109 mg/dL — AB (ref 0–99)
Triglycerides: 240 mg/dL — ABNORMAL HIGH (ref 0–149)
VLDL CHOLESTEROL CAL: 48 mg/dL — AB (ref 5–40)

## 2016-08-19 LAB — CBC WITH DIFFERENTIAL/PLATELET
BASOS: 0 %
Basophils Absolute: 0 10*3/uL (ref 0.0–0.2)
EOS (ABSOLUTE): 0.2 10*3/uL (ref 0.0–0.4)
EOS: 1 %
HEMOGLOBIN: 11.4 g/dL — AB (ref 13.0–17.7)
Hematocrit: 34.4 % — ABNORMAL LOW (ref 37.5–51.0)
IMMATURE GRANS (ABS): 0 10*3/uL (ref 0.0–0.1)
IMMATURE GRANULOCYTES: 0 %
LYMPHS: 13 %
Lymphocytes Absolute: 1.5 10*3/uL (ref 0.7–3.1)
MCH: 26.5 pg — AB (ref 26.6–33.0)
MCHC: 33.1 g/dL (ref 31.5–35.7)
MCV: 80 fL (ref 79–97)
MONOCYTES: 7 %
Monocytes Absolute: 0.7 10*3/uL (ref 0.1–0.9)
NEUTROS ABS: 8.8 10*3/uL — AB (ref 1.4–7.0)
NEUTROS PCT: 79 %
PLATELETS: 415 10*3/uL — AB (ref 150–379)
RBC: 4.3 x10E6/uL (ref 4.14–5.80)
RDW: 15.8 % — ABNORMAL HIGH (ref 12.3–15.4)
WBC: 11.3 10*3/uL — ABNORMAL HIGH (ref 3.4–10.8)

## 2016-08-19 LAB — TSH: TSH: 3.98 u[IU]/mL (ref 0.450–4.500)

## 2016-08-19 LAB — T4, FREE: Free T4: 1.16 ng/dL (ref 0.82–1.77)

## 2016-08-19 LAB — COMPREHENSIVE METABOLIC PANEL
A/G RATIO: 1.3 (ref 1.2–2.2)
ALT: 18 IU/L (ref 0–44)
AST: 20 IU/L (ref 0–40)
Albumin: 3.9 g/dL (ref 3.5–5.5)
Alkaline Phosphatase: 75 IU/L (ref 39–117)
BILIRUBIN TOTAL: 0.3 mg/dL (ref 0.0–1.2)
BUN/Creatinine Ratio: 12 (ref 9–20)
BUN: 26 mg/dL — AB (ref 6–24)
CO2: 23 mmol/L (ref 18–29)
CREATININE: 2.19 mg/dL — AB (ref 0.76–1.27)
Calcium: 9.7 mg/dL (ref 8.7–10.2)
Chloride: 103 mmol/L (ref 96–106)
GFR, EST AFRICAN AMERICAN: 42 mL/min/{1.73_m2} — AB (ref >59)
GFR, EST NON AFRICAN AMERICAN: 36 mL/min/{1.73_m2} — AB (ref >59)
Globulin, Total: 3.1 g/dL (ref 1.5–4.5)
Glucose: 45 mg/dL — ABNORMAL LOW (ref 65–99)
Potassium: 4.3 mmol/L (ref 3.5–5.2)
Sodium: 143 mmol/L (ref 134–144)
TOTAL PROTEIN: 7 g/dL (ref 6.0–8.5)

## 2016-08-19 LAB — MICROALBUMIN, URINE: Microalbumin, Urine: 3035.4 ug/mL

## 2016-08-19 NOTE — Telephone Encounter (Signed)
Ok, please send to alternate sleep lab.

## 2016-08-19 NOTE — Telephone Encounter (Signed)
[facility] called stating pt has no showed two appts with them and they are unable to schedule pt due to this. If pt would still like to have a sleep study we can send pt to another practice. Thanks!

## 2016-08-29 ENCOUNTER — Ambulatory Visit (INDEPENDENT_AMBULATORY_CARE_PROVIDER_SITE_OTHER): Payer: BLUE CROSS/BLUE SHIELD | Admitting: Physician Assistant

## 2016-08-29 ENCOUNTER — Encounter: Payer: Self-pay | Admitting: Physician Assistant

## 2016-08-29 VITALS — BP 160/85 | HR 73 | Temp 98.1°F | Resp 16 | Ht 66.5 in | Wt 235.4 lb

## 2016-08-29 DIAGNOSIS — I2583 Coronary atherosclerosis due to lipid rich plaque: Secondary | ICD-10-CM

## 2016-08-29 DIAGNOSIS — I2510 Atherosclerotic heart disease of native coronary artery without angina pectoris: Secondary | ICD-10-CM

## 2016-08-29 DIAGNOSIS — R29818 Other symptoms and signs involving the nervous system: Secondary | ICD-10-CM

## 2016-08-29 DIAGNOSIS — E1165 Type 2 diabetes mellitus with hyperglycemia: Secondary | ICD-10-CM | POA: Diagnosis not present

## 2016-08-29 DIAGNOSIS — I10 Essential (primary) hypertension: Secondary | ICD-10-CM | POA: Diagnosis not present

## 2016-08-29 MED ORDER — CARVEDILOL 25 MG PO TABS: 25.0000 mg | ORAL_TABLET | Freq: Two times a day (BID) | ORAL | 0 refills | Status: DC

## 2016-08-29 NOTE — Assessment & Plan Note (Signed)
Continue with protein snack at HS. Avoid sugary drinks and sweets.

## 2016-08-29 NOTE — Progress Notes (Signed)
Patient ID: [name], male    DOB: 1975/07/19, 41 y.o.   MRN: [id number]  PCP: Harrison Mons, PA-C  Chief Complaint  Patient presents with  . Follow-up  . Diabetes    pt did not bring glucose readings log with him, he stated" it is in his machine"  . Hypertension    Subjective:   Presents for evaluation of HTN.  Last week, we added additional HCTZ 12.5 mg to his regimen of lisinopril-HCTZ and carvedilol, and anticipated need to increase lisinopril dose. He has tolerated the addition well, without adverse effects. Today I note that he takes the lisinopril-HCTZ 20-12.5 TWO daily, rather than the ONE that I thought.  He was referred to cardiology, sleep medicine and [facility] [place]. His appointment with cardiology is in 3-4 weeks. Unable to be scheduled at [facility] sleep lab due to 2 now shows and a cancellation last year. He did not have insurance at the time, but does now. He is on the waiting list for [facility].  Since his visit last week, he has had no CP, SOB, HA, dizziness. No increased visual changes. No nausea, vomiting.  Home glucose readings 43-428. The lows were happening at night, and he was experiencing feeling cold and clammy, nauseated and hungry. His girlfriend started giving him a protein bar at HS and he hasn't had any additional hypoglycemia. Fasting readings in the mornings are 120-180's. The 428 occurred following eating a Sno-ball and drinking a sugary drink.  He has not been able to check his home BP. He doesn't have a cuff, and hasn't found a convenient pharmacy to check it.    Review of Systems As above.     Patient Active Problem List   Diagnosis Date Noted  . Suspected sleep apnea 08/18/2016  . Seizure ([place]) 03/25/2015  . Observed seizure-like activity ([place]) 03/25/2015  . 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 Diabetes type 2, uncontrolled ([place]) 04/21/2013  . Blurred vision 11/20/2011  . BMI 39.0-39.9,adult  11/19/2011  . Hypertension 05/11/2011  . Hyperlipidemia 05/11/2011  . CAD (coronary artery disease) 05/11/2011     Prior to Admission medications   Medication Sig Start Date End Date Taking? Authorizing Provider  aspirin 325 MG tablet Take 325 mg by mouth daily.   Yes Hayden Rasmussen, MD  atorvastatin (LIPITOR) 20 MG tablet Take 1 tablet by mouth at bedtime. 01/01/16  Yes Harbor Vanover, PA-C  blood glucose meter kit and supplies KIT Dispense based on patient and insurance preference. Use QD-BID home glucose monitoring. (FOR ICD-9 250.00, 250.01). 08/18/16  Yes Azaleah Usman, PA-C  carvedilol (COREG) 12.5 MG tablet Take 1 tablet (12.5 mg total) by mouth 2 (two) times daily. 01/01/16  Yes Lauriana Denes, PA-C  cholecalciferol (VITAMIN D) 1000 UNITS tablet Take 1,000 Units by mouth daily.   Yes [provider]  fish oil-omega-3 fatty acids 1000 MG capsule Take 2 g by mouth daily.   Yes [provider]  glimepiride (AMARYL) 4 MG tablet Take 1 tablet twice a day before meals. 01/01/16  Yes Juliet Vasbinder, PA-C  hydrochlorothiazide (MICROZIDE) 12.5 MG capsule Take 1 capsule (12.5 mg total) by mouth daily. 08/18/16  Yes Najee Manninen, PA-C  ipratropium (ATROVENT) 0.06 % nasal spray Place 2 sprays into both nostrils 4 (four) times daily. 06/09/16  Yes Billy Fischer, MD  lisinopril-hydrochlorothiazide (PRINZIDE,ZESTORETIC) 20-12.5 MG tablet TAKE TWO TABLETS BY MOUTH ONCE DAILY 08/04/16  Yes Harrison Mons, PA-C  metFORMIN (GLUCOPHAGE) 1000 MG tablet Take 1 tablet (1,000 mg total) by mouth 2 (two) times daily with a meal. 01/01/16  Yes Dicie Edelen, PA-C  Multiple Vitamin (MULTIVITAMIN) tablet Take 1 tablet by mouth daily.   Yes [provider]  nitroGLYCERIN (NITROSTAT) 0.4 MG SL tablet DISSOLVE ONE TABLET UNDER THE TONGUE EVERY 5 MINUTES AS NEEDED FOR CHEST PAIN.  DO NOT EXCEED A TOTAL OF 3 DOSES IN 15 MINUTES 08/06/16  Yes Hilty, Nadean Corwin, MD  empagliflozin (JARDIANCE)  10 MG TABS tablet Take 10 mg by mouth daily. Patient not taking: Reported on 08/29/2016 04/18/16   Harrison Mons, PA-C  meloxicam ([place]) 15 MG tablet TAKE ONE TABLET BY MOUTH ONCE DAILY FOR  SHOULDER  PAIN Patient not taking: Reported on 08/18/2016 02/15/16   Harrison Mons, PA-C     No Known Allergies     Objective:  Physical Exam  Constitutional: He is oriented to person, place, and time. He appears well-developed and well-nourished. He is active and cooperative. No distress.  BP (!) 160/85 (BP Location: Left Arm)   Pulse 73   Temp 98.1 F (36.7 C) (Oral)   Resp 16   Ht 5' 6.5" (1.689 m)   Wt 235 lb 6.4 oz (106.8 kg)   SpO2 98%   BMI 37.43 kg/m   HENT:  Head: Normocephalic and atraumatic.  Right Ear: Hearing normal.  Left Ear: Hearing normal.  Eyes: Conjunctivae are normal. No scleral icterus.  Neck: Normal range of motion. Neck supple. No thyromegaly present.  Cardiovascular: Normal rate, regular rhythm and normal heart sounds.   Pulses:      Radial pulses are 2+ on the right side, and 2+ on the left side.  Pulmonary/Chest: Effort normal and breath sounds normal.  Lymphadenopathy:       Head (right side): No tonsillar, no preauricular, no posterior auricular and no occipital adenopathy present.       Head (left side): No tonsillar, no preauricular, no posterior auricular and no occipital adenopathy present.    He has no cervical adenopathy.       Right: No supraclavicular adenopathy present.       Left: No supraclavicular adenopathy present.  Neurological: He is alert and oriented to person, place, and time. No sensory deficit.  Skin: Skin is warm, dry and intact. No rash noted. No cyanosis or erythema. Nails show no clubbing.  Psychiatric: He has a normal mood and affect. His speech is normal and behavior is normal.           Assessment & Plan:   Problem List Items Addressed This Visit    Hypertension - Primary    Improved, but still uncontrolled. CONTINUE  lisinopril-HCTZ 20-12.5 mf 2 PO BID. STOP additional HCTZ 12.5 mg. INCREASE carvedilol to 25 mg BID.  Refer to alternate sleep lab to address suspected OSA.  To ED if he develops CP, SOB, HA, dizziness, extremity weakness, facial droop, speech difficulty.      Relevant Medications   carvedilol (COREG) 25 MG tablet   CAD (coronary artery disease)   Relevant Medications   carvedilol (COREG) 25 MG tablet   Diabetes type 2, uncontrolled ([place])    Continue with protein snack at HS. Avoid sugary drinks and sweets.      Suspected sleep apnea    Refer to alternate sleep lab.      Relevant Orders   Ambulatory referral to Sleep Studies       Return in 4 days (on 09/02/2016)  for re-evaluation of blood pressure.   Fara Chute, PA-C [facility] [place]

## 2016-08-29 NOTE — Assessment & Plan Note (Signed)
Refer to alternate sleep lab.

## 2016-08-29 NOTE — Assessment & Plan Note (Signed)
Improved, but still uncontrolled. CONTINUE lisinopril-HCTZ 20-12.5 mf 2 PO BID. STOP additional HCTZ 12.5 mg. INCREASE carvedilol to 25 mg BID.  Refer to alternate sleep lab to address suspected OSA.  To ED if he develops CP, SOB, HA, dizziness, extremity weakness, facial droop, speech difficulty.

## 2016-08-29 NOTE — Patient Instructions (Addendum)
1. CONTINUE lisinopril-HCTZ 20/12.5 mg TWO tablets ONE time each day.  2. STOP the additional HCTZ 12.5 mg  3. INCREASE the carvedilol from 12.5 mg twice a day to 25 mg twice a day. You can use up the 12.5 mg tablets that you have by taking TWO of them TWICE a day. I've sent a prescription for the new dose to the pharmacy.  4. Get a blood pressure cuff. Check your blood pressure daily and record it.    IF you received an x-ray today, you will receive an invoice from [facility]. Please contact [facility] at [phone] with questions or concerns regarding your invoice.   IF you received labwork today, you will receive an invoice from [facility]. Please contact [facility] at [phone] with questions or concerns regarding your invoice.   Our billing staff will not be able to assist you with questions regarding bills from these companies.  You will be contacted with the lab results as soon as they are available. The fastest way to get your results is to activate your My Chart account. Instructions are located on the last page of this paperwork. If you have not heard from [place] regarding the results in 2 weeks, please contact this office.

## 2016-09-04 ENCOUNTER — Other Ambulatory Visit: Payer: Self-pay | Admitting: Physician Assistant

## 2016-09-04 DIAGNOSIS — E1165 Type 2 diabetes mellitus with hyperglycemia: Secondary | ICD-10-CM

## 2016-09-04 DIAGNOSIS — I10 Essential (primary) hypertension: Secondary | ICD-10-CM

## 2016-09-04 DIAGNOSIS — I2583 Coronary atherosclerosis due to lipid rich plaque: Secondary | ICD-10-CM

## 2016-09-04 DIAGNOSIS — I2510 Atherosclerotic heart disease of native coronary artery without angina pectoris: Secondary | ICD-10-CM

## 2016-09-05 ENCOUNTER — Ambulatory Visit: Payer: BLUE CROSS/BLUE SHIELD | Admitting: Physician Assistant

## 2016-09-16 ENCOUNTER — Other Ambulatory Visit: Payer: Self-pay | Admitting: Physician Assistant

## 2016-09-16 DIAGNOSIS — I2510 Atherosclerotic heart disease of native coronary artery without angina pectoris: Secondary | ICD-10-CM

## 2016-09-16 DIAGNOSIS — I2583 Coronary atherosclerosis due to lipid rich plaque: Principal | ICD-10-CM

## 2016-09-16 NOTE — Telephone Encounter (Signed)
Pt calling to check up on his Atorvastatin he states that he is completely out please respond

## 2016-09-28 ENCOUNTER — Other Ambulatory Visit: Payer: Self-pay | Admitting: Physician Assistant

## 2016-09-28 DIAGNOSIS — E1165 Type 2 diabetes mellitus with hyperglycemia: Secondary | ICD-10-CM

## 2016-09-30 ENCOUNTER — Ambulatory Visit: Payer: BLUE CROSS/BLUE SHIELD | Admitting: Internal Medicine

## 2016-10-07 ENCOUNTER — Telehealth: Payer: Self-pay | Admitting: Neurology

## 2016-10-07 NOTE — Telephone Encounter (Signed)
Pt was informed of his prior no shows to appt that were scheduled at our office. He was informed that if he no shows to his appt on 11/12/16 he will be dismissed from our practice.

## 2016-10-13 ENCOUNTER — Other Ambulatory Visit: Payer: Self-pay | Admitting: Physician Assistant

## 2016-10-13 DIAGNOSIS — I2510 Atherosclerotic heart disease of native coronary artery without angina pectoris: Secondary | ICD-10-CM

## 2016-10-13 DIAGNOSIS — I10 Essential (primary) hypertension: Secondary | ICD-10-CM

## 2016-10-13 DIAGNOSIS — I2583 Coronary atherosclerosis due to lipid rich plaque: Secondary | ICD-10-CM

## 2016-11-06 ENCOUNTER — Encounter (INDEPENDENT_AMBULATORY_CARE_PROVIDER_SITE_OTHER): Payer: BLUE CROSS/BLUE SHIELD | Admitting: Family Medicine

## 2016-11-12 ENCOUNTER — Ambulatory Visit (INDEPENDENT_AMBULATORY_CARE_PROVIDER_SITE_OTHER): Payer: BLUE CROSS/BLUE SHIELD | Admitting: Neurology

## 2016-11-12 ENCOUNTER — Encounter: Payer: Self-pay | Admitting: Neurology

## 2016-11-12 VITALS — BP 183/93 | HR 90 | Ht 66.0 in | Wt 247.5 lb

## 2016-11-12 DIAGNOSIS — I10 Essential (primary) hypertension: Secondary | ICD-10-CM | POA: Diagnosis not present

## 2016-11-12 DIAGNOSIS — IMO0002 Reserved for concepts with insufficient information to code with codable children: Secondary | ICD-10-CM

## 2016-11-12 DIAGNOSIS — E113392 Type 2 diabetes mellitus with moderate nonproliferative diabetic retinopathy without macular edema, left eye: Secondary | ICD-10-CM | POA: Diagnosis not present

## 2016-11-12 DIAGNOSIS — I129 Hypertensive chronic kidney disease with stage 1 through stage 4 chronic kidney disease, or unspecified chronic kidney disease: Secondary | ICD-10-CM

## 2016-11-12 DIAGNOSIS — E1165 Type 2 diabetes mellitus with hyperglycemia: Secondary | ICD-10-CM | POA: Diagnosis not present

## 2016-11-12 DIAGNOSIS — G4719 Other hypersomnia: Secondary | ICD-10-CM | POA: Diagnosis not present

## 2016-11-12 NOTE — Progress Notes (Signed)
SLEEP MEDICINE CLINIC   Provider:  Larey Seat, M D  Primary Care Physician:  Harrison Mons, PA-C   Referring Provider: Harrison Mons, PA-C    Chief Complaint  Patient presents with  . New Patient (Initial Visit)    HPI:  [name] is a 41 y.o. male , seen here as in a referral  from [place] for a sleep evaluation.   Chief complaint according to patient : " I was told I snore"  I had the pleasure of seeing Mr. [name] today in a new patient consult. Mr. 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  Sleep habits are as follows: Mr. [name] reports that he usually spends the hour before going to bed watching TV with his girlfriend, but continues to watch TV in his bedroom after 10 pm,  it will take him up to 2 hours to go to sleep by the TV is running. The bedroom is neither dark nor quiet, but cool. He prefers to sleep on his side and sometimes prone, he reports that he does not roll onto his back. He uses between 4 and 6 kg to prop up and help him sleep in a comfortable position. He reports recalling some dreams, but has not been suffering from nightmares, night terrors etc. he is not known to talk or act out dreams. He will have 2-3 bathroom breaks at night, he has not noted any nocturnal headaches, palpitation, diaphoresis, nausea or dizziness. His girlfriend starts working at 6:30 AM and has a over 1 hour commute, she leaves the house by 5:30. By 6 AM he rises to go to his work.  Sleep medical history and family sleep history: mother has OSA and is on CPAP- no childhood history of sleep walking, enuresis, night terrors, etc. This patient is hypertensive on 4  medications. DM was diagnosed at age 73, type 2, was placed on insulin while having heart surgery.   Social history: He has a remote history of smoking, quit in 2010, he drinks caffeine; ice tea 2 a week, Sodas- none, no coffee. 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  Review of Systems:Out of a complete 14 system review, the patient complains of only the following symptoms, and all other reviewed systems are negative. The patient states that he has insomnia has been told that she snores, feels fatigued in daytime often feels hot and he endorsed blurred vision. He endorsed the fatigue severity score today at 26 points, not very high and the Epworth sleepiness score was endorsed at 13 points indicating that he is excessively daytime sleepy.   Social History   Social History  . Marital status: Significant Other    Spouse name: [name]  . Number of children: 0  . Years of education: Associate's Degree   Occupational History  . PC technician [employer]   Social History Main Topics  . Smoking status: Former Smoker    Quit date: 09/17/2008  . Smokeless tobacco: Never Used  . Alcohol use Yes  Comment: drink beer occas. (4-5 a week)  . Drug use: No  . Sexual activity: No   Other Topics Concern  . Not on file   Social History Narrative   Epworth Sleepiness Scale 01/10/2015 = 12/13      Lives alone.       Family History  Problem Relation Age of Onset  . Hypertension Mother   . Heart disease Father   . Hypertension Father   . Diabetes Father   . Hyperlipidemia Father     Past Medical History:  Diagnosis Date  . CAD (coronary artery disease)   . Diabetes mellitus   . Hyperlipidemia   . Hypertension     Past Surgical History:  Procedure Laterality Date  . CORONARY ANGIOPLASTY  2010   LIMA to LAD, SVG to OM1,SVG to PDA  . CORONARY ARTERY BYPASS GRAFT      Current  Outpatient Prescriptions  Medication Sig Dispense Refill  . amLODipine (NORVASC) 10 MG tablet Take 10 mg by mouth.    Marland Kitchen atorvastatin (LIPITOR) 20 MG tablet TAKE ONE TABLET BY MOUTH ONCE DAILY AT BEDTIME 90 tablet 3  . blood glucose meter kit and supplies KIT Dispense based on patient and insurance preference. Use QD-BID home glucose monitoring. (FOR ICD-9 250.00, 250.01). 1 each 0  . carvedilol (COREG) 25 MG tablet TAKE 1 TABLET BY MOUTH TWICE DAILY 180 tablet 3  . cholecalciferol (VITAMIN D) 1000 UNITS tablet Take 1,000 Units by mouth daily.    . fish oil-omega-3 fatty acids 1000 MG capsule Take 2 g by mouth daily.    Marland Kitchen glimepiride (AMARYL) 4 MG tablet TAKE ONE TABLET BY MOUTH TWICE DAILY BEFORE MEAL(S) 60 tablet 5  . lisinopril-hydrochlorothiazide (PRINZIDE,ZESTORETIC) 20-12.5 MG tablet TAKE 2 TABLETS BY MOUTH ONCE DAILY 180 tablet 0  . metFORMIN (GLUCOPHAGE) 1000 MG tablet TAKE ONE TABLET BY MOUTH TWICE DAILY WITH MEALS 60 tablet 5  . Multiple Vitamin (MULTIVITAMIN) tablet Take 1 tablet by mouth daily.    . nitroGLYCERIN (NITROSTAT) 0.4 MG SL tablet DISSOLVE ONE TABLET UNDER THE TONGUE EVERY 5 MINUTES AS NEEDED FOR CHEST PAIN.  DO NOT EXCEED A TOTAL OF 3 DOSES IN 15 MINUTES 25 tablet 3   No current facility-administered medications for this visit.     Allergies as of 11/12/2016  . (No Known Allergies)    Vitals: BP (!) 183/93   Pulse 90   Ht _0  (1.676 m)   Wt 247 lb 8 oz (112.3 kg)   BMI 39.95 kg/m  Last Weight:  Wt Readings from Last 1 Encounters:  11/12/16 247 lb 8 oz (112.3 kg)   WGN:FAOZ mass index is 39.95 kg/m.     Last Height:   Ht Readings from Last 1 Encounters:  11/12/16 _1  (1.676 m)    Physical exam:  General: The patient is awake, alert and appears not in acute distress. The patient is well groomed. Head: Normocephalic, atraumatic. Neck is supple. Mallampati 5- his uvula is not visible ,  neck circumference:19.25" . Nasal airflow congestion, .  Retrognathia is seen.  Cardiovascular:  Regular rate and rhythm , without  murmurs or carotid bruit, and without distended neck veins. Respiratory: Lungs are clear to auscultation. Skin:  Without evidence of edema, or rash Trunk: BMI is 40. The patient's posture is erect    Neurologic exam : The patient is awake and alert, oriented to place and time.  Attention span & concentration ability appears normal. Speech is fluent,  without  dysarthria, dysphonia or aphasia. Mood and affect are appropriate.  Cranial nerves: Pupils are equal and briskly reactive to light.  Extraocular movements  in vertical and horizontal planes intact and without nystagmus. Visual fields by finger perimetry are intact. Hearing to finger rub intact. Facial sensation intact to fine touch.Facial motor strength is symmetric and tongue and uvula move midline. Shoulder shrug was symmetrical.  Motor exam:   Normal tone, muscle bulk and symmetric strength in all extremities. Sensory:  Fine touch, pinprick and vibration were intact in upper extremities.  Coordination: Rapid alternating movements in the fingers/hands was normal. Finger-to-nose maneuver  normal without evidence of ataxia, dysmetria or tremor. Gait and station: Patient walks without assistive device . Strength within normal limits. Stance is stable and normal.   Deep tendon reflexes: in the  upper and lower extremities are attenuated.   Assessment:  After physical and neurologic examination, review of laboratory studies,  Personal review of imaging studies, reports of other /same  Imaging studies, results of polysomnography and / or neurophysiology testing and pre-existing records as far as provided in visit., my assessment is   1) Mr. Maselli reports that he waited almost 300 pounds when he became a diabetic at age 53, he has been able to reduce his body mass index to about 40 from over 50 before. He was able to discontinue insulin and stay on oral medication such as  metformin after coronary artery disease and bypass surgery. His hypertension is currently not well controlled on 4 medications, his blood pressure today was 183/93. He states that he has been told that she snores by his girlfriend, she has also witnessed apneas.  Given his risk factors such as a large neck circumference, the high-grade Mallampati at body mass index and his comorbidities I would like to invite this patient for an attended sleep study split night polysomnogram the.   He denies any headaches and he actually didn't endorse a lot of fatigue, for this reason I will not order capnography.  Identification of sleep apnea and treatment for sleep apnea may help him to reduce his hypertension, improve his diabetes control, and decrease his excessive daytime sleepiness currently endorsing an Epworth score of 13 points.  The patient was advised of the nature of the diagnosed disorder , the treatment options and the  risks for general health and wellness arising from not treating the condition.   I spent more than 45 minutes of face to face time with the patient.  Greater than 50% of time was spent in counseling and coordination of care. We have discussed the diagnosis and differential and I answered the patient's questions.    SPLIT with possible oxygen .   RV in 30-90 days.   Larey Seat, MD 0/07/457, 1:36 PM  Certified in Neurology by ABPN Certified in [place] by [facility] [address] [place], [place] [address]

## 2016-11-12 NOTE — Patient Instructions (Signed)
Please remember to try to maintain good sleep hygiene, which means: Keep a regular sleep and wake schedule, try not to exercise or have a meal within 2 hours of your bedtime, try to keep your bedroom conducive for sleep, that is, cool and dark, without light distractors such as an illuminated alarm clock, and refrain from watching TV right before sleep or in the middle of the night and do not keep the TV or radio on during the night. Also, try not to use or play on electronic devices at bedtime, such as your cell phone, tablet PC or laptop. If you like to read at bedtime on an electronic device, try to dim the background light as much as possible. Do not eat in the middle of the night.   We will request an attended sleep study.    We will look for leg twitching and snoring or sleep apnea.   For chronic insomnia, you are best followed by a psychiatrist and/or sleep psychologist.   We will call you with the sleep study results and make a follow up appointment if needed.         Sleep Studies A sleep study (polysomnogram) is a series of tests done while you are sleeping. It can show how well you sleep. This can help your health care provider diagnose a sleep disorder and show how severe your sleep disorder is. A sleep study may lead to treatment that will help you sleep better and prevent other medical problems caused by poor sleep. If you have a sleep disorder, you may also be at risk for:  Sleep-related accidents.  High blood pressure.  Heart disease.  Stroke.  Other medical conditions.  Sleep disorders are common. Your health care provider may suspect a sleep disorder if you:  Have loud snoring most nights.  Have brief periods when you stop breathing at night.  Feel sleepy on most days.  Fall asleep suddenly during the day.  Have trouble falling asleep or staying asleep.  Feel like you need to move your legs when trying to fall asleep.  Have dreams that seem very real  shortly after falling asleep.  Feel like you cannot move when you first wake up.  Which tests will I need to have? Most sleep studies last all night and include these tests:  Recordings of your brain activity.  Recordings of your eye movements.  Recording of your heart rate and rhythm.  Blood pressure readings.  Readings of the amount of oxygen in your blood.  Measurements of your chest and belly movement as you breathe during sleep.  If you have signs of the sleep disorder called sleep apnea during your test, you may get a mask to wear for the second half of the night.  The mask provides continuous positive airway pressure (CPAP). This may improve sleep apnea significantly.  You will then have all tests done again with the mask in place to see if your measurements and recordings change.  How are sleep studies done? Most sleep studies are done over one full night of sleep.  You will arrive at the study center in the evening and can go home in the morning.  Bring your pajamas and toothbrush.  Do not have caffeine on the day of your sleep study.  Your health care provider will let you know if you need to stop taking any of your regular medicines before the test.  To do the tests included in a polysomnogram, you will have:  Round, sticky patches with sensors attached to recording wires (electrodes) placed on your scalp, face, chest, and limbs.  Wires from all the electrodes and sensors run from your bed to a computer. The wires can be taken off and put back on if you need to get out of bed to go to the bathroom.  A sensor placed over your nose to measure airflow.  A finger clip put on one finger to measure your blood oxygen level.  A belt around your belly and a belt around your chest to measure breathing movements.  Where are sleep studies done? Sleep studies are done at sleep centers. A sleep center may be inside a hospital, office, or clinic. The room where you have  the study may look like a hospital room or a hotel room. The health care providers doing the study may come in and out of the room during the study. Most of the time, they will be in another room monitoring your test. How is information from sleep studies helpful? A polysomnogram can be used along with your medical history and a physical exam to diagnose conditions, such as:  Sleep apnea.  Restless legs syndrome.  Sleep-related seizure disorders.  Sleep-related movement disorders.  A medical doctor who specializes in sleep will evaluate your sleep study. The specialist will share the results with your primary health care provider. Treatments based on your sleep study may include:  Improving your sleep habits (sleep hygiene).  Wearing a CPAP mask.  Wearing an oral device at night to improve breathing and reduce snoring.  Taking medicine for: ? Restless legs syndrome. ? Sleep-related seizure disorder. ? Sleep-related movement disorder.  This information is not intended to replace advice given to you by your health care provider. Make sure you discuss any questions you have with your health care provider. Document Released: 10/05/2002 Document Revised: 11/25/2015 Document Reviewed: 06/06/2013 Elsevier Interactive Patient Education  [employer].

## 2016-11-26 ENCOUNTER — Telehealth: Payer: Self-pay | Admitting: Neurology

## 2016-11-26 ENCOUNTER — Other Ambulatory Visit: Payer: Self-pay | Admitting: Neurology

## 2016-11-26 DIAGNOSIS — G4733 Obstructive sleep apnea (adult) (pediatric): Secondary | ICD-10-CM

## 2016-11-26 NOTE — Telephone Encounter (Signed)
Order placed

## 2016-11-26 NOTE — Telephone Encounter (Signed)
[employer] denied Split sleep and approved HST.  Can I get an order for HST? °

## 2016-12-05 ENCOUNTER — Ambulatory Visit: Payer: BLUE CROSS/BLUE SHIELD | Admitting: Internal Medicine

## 2016-12-09 ENCOUNTER — Other Ambulatory Visit: Payer: Self-pay

## 2016-12-09 DIAGNOSIS — I10 Essential (primary) hypertension: Secondary | ICD-10-CM

## 2016-12-09 DIAGNOSIS — E1165 Type 2 diabetes mellitus with hyperglycemia: Secondary | ICD-10-CM

## 2016-12-09 DIAGNOSIS — I2583 Coronary atherosclerosis due to lipid rich plaque: Secondary | ICD-10-CM

## 2016-12-09 DIAGNOSIS — I2510 Atherosclerotic heart disease of native coronary artery without angina pectoris: Secondary | ICD-10-CM

## 2016-12-10 ENCOUNTER — Ambulatory Visit (INDEPENDENT_AMBULATORY_CARE_PROVIDER_SITE_OTHER): Payer: BLUE CROSS/BLUE SHIELD | Admitting: Neurology

## 2016-12-10 DIAGNOSIS — G4733 Obstructive sleep apnea (adult) (pediatric): Secondary | ICD-10-CM

## 2016-12-10 DIAGNOSIS — E6609 Other obesity due to excess calories: Secondary | ICD-10-CM

## 2016-12-10 DIAGNOSIS — G4734 Idiopathic sleep related nonobstructive alveolar hypoventilation: Principal | ICD-10-CM

## 2016-12-10 DIAGNOSIS — Z6839 Body mass index (BMI) 39.0-39.9, adult: Secondary | ICD-10-CM

## 2016-12-11 NOTE — Telephone Encounter (Signed)
Fax request from the pharmacy for lisinoprilHCTZ. Based on chart review, patient has established with an alternate practice for primary care since my last visit with him. Visit with Dr. Marland Kitchen [facility] 11/26/2016 reviewed in [place]. Will forward request to that provider.

## 2016-12-16 ENCOUNTER — Telehealth: Payer: Self-pay | Admitting: Neurology

## 2016-12-16 ENCOUNTER — Other Ambulatory Visit: Payer: Self-pay | Admitting: Neurology

## 2016-12-16 NOTE — Telephone Encounter (Signed)
-----   Message from Larey Seat, MD sent at 12/16/2016 10:17 AM EDT ----- Diagnosis of severe obstructive apnea with hypoxemia confirmed in this HST,  This patient needs CPAP therapy for treatment of obstructive sleep apnea at this degree and comorbidity ( severe sleep hypoxemia, CAD and Obesity, DM - insulin dependent ) . I ordered an auto titration device 6-16 cm water, mask of choice, heated humidity. Cc Harrison Mons, [place]

## 2016-12-16 NOTE — Telephone Encounter (Signed)
I called pt. I advised pt that Dr. Brett Fairy reviewed their sleep study results and found that pt severe OSA. Dr. Brett Fairy recommends that pt starts CPAP. I reviewed PAP compliance expectations with the pt. Pt is agreeable to starting a CPAP. I advised pt that an order will be sent to a DME, [facility], and [facility] will call the pt within about one week after they file with the pt's insurance. [facility] will show the pt how to use the machine, fit for masks, and troubleshoot the CPAP if needed. A follow up appt was made for insurance purposes with Bobbie Stack on Mar 18 2017 at 8:00 am. Pt verbalized understanding to arrive 15 minutes early and bring their CPAP. A letter with all of this information in it will be mailed to the pt as a reminder. I verified with the pt that the address we have on file is correct. Pt verbalized understanding of results. Pt had no questions at this time but was encouraged to call back if questions arise.

## 2016-12-16 NOTE — Procedures (Signed)
NAME: [name] DOB: 23-Nov-1975 MEDICAL RECORD [id number] DOS: 12/10/16 REFERRING PHYSICIAN: Harrison Mons, PA-C STUDY PERFORMED: Home Sleep test HISTORY: [name] is a 41 year old male patient, seen here for Snoring, Nocturia and to rule out apnea. The patient states that he has insomnia, has been told that she snores, feels fatigued in daytime often feels hot and clammy at night- he sleeps on multiple pillows, endorsed the fatigue severity score today at 26 points, and the Epworth sleepiness score was endorsed at 13 points indicating that he is excessively daytime sleepy. BMI is 40.   STUDY RESULTS: Total Recording:   8 hours and 6 minutes Total Apnea/Hypopnea Index (AHI):  54.1 /Hour Average Oxygen Saturation:  85% Lowest Oxygen Saturation:  48%  Time of Oxygen Saturation at or below SpO2 88%:  366 minutes  Average Heart Rate:  79 bpm (71 to 92 bpm) IMPRESSION: Severe sleep apnea and clinically significant hypoxemia. RECOMMENDATION: This patient needs urgently treatment with CPAP. A dental device would not help with the hypoxemia component. Weight loss is strongly recommended.  I certify that I have reviewed the raw data recording prior to the issuance of this report in accordance with the standards of the American Academy of Sleep Medicine (AASM). Larey Seat, MD   12-16-2016   Medical Director of [facility] [place], [place] and [place] Member of and accredited by AASM

## 2016-12-16 NOTE — Addendum Note (Signed)
Addended by: Larey Seat on: 12/16/2016 10:17 AM   Modules accepted: Orders

## 2017-03-17 ENCOUNTER — Encounter: Payer: Self-pay | Admitting: Neurology

## 2017-03-18 ENCOUNTER — Ambulatory Visit: Payer: BLUE CROSS/BLUE SHIELD | Admitting: Adult Health

## 2017-03-18 ENCOUNTER — Encounter: Payer: Self-pay | Admitting: Adult Health

## 2017-03-18 VITALS — BP 180/90 | HR 75 | Ht 66.75 in | Wt 273.0 lb

## 2017-03-18 DIAGNOSIS — Z9989 Dependence on other enabling machines and devices: Secondary | ICD-10-CM

## 2017-03-18 DIAGNOSIS — G4733 Obstructive sleep apnea (adult) (pediatric): Secondary | ICD-10-CM

## 2017-03-18 NOTE — Progress Notes (Signed)
PATIENT: [name] DOB: 1975/06/19  REASON FOR VISIT: follow up-OSA on CPAP HISTORY FROM: patient  HISTORY OF PRESENT ILLNESS: Today 03/18/17 [name] is a 41 year old male with a history of obstructive sleep apnea on CPAP.  He returns today for his initial CPAP compliance visit.  His download indicates that he use his machine 24 out of 30 days for compliance of 80%.  He uses machine greater than 4 hours 21 out of 30 days for compliance of 70%.  On average he uses his machine 5 hours and 49 minutes.  His residual AHI is 3.  He is on AutoSet with a minimum pressure of 5 cm of water and maximum pressure of 16 cm of water with EPR of 3.  The patients leak in the 95th percentile is 13.2 L/min.  The patient reports that he no longer snores.  He does state that he is starting to feel better.  He returns today for an evaluation.  HISTORY [name] is a 41 y.o. male , seen here as in a referral  from [place] for a sleep evaluation.   Chief complaint according to patient : " I was told I snore"  I had the pleasure of seeing [name] today in a new patient consult. [name] has struggled with weight gain, and has been diagnosed with hypertension and diabetes, 8 years ago at age 52 he underwent open heart surgery for coronary artery disease, he had a retinal hemorrhage with detachment in June of this year, affecting the left eye. 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  Sleep habits are as follows: [name] reports that he usually spends the hour before going to bed watching TV with his girlfriend, but continues to watch TV in his bedroom after 10 pm,  it will take him up to 2 hours to go to sleep by the TV is running. The bedroom is neither dark nor quiet, but cool. He prefers to sleep on his side and sometimes prone, he reports that he does not roll onto his back. He uses between 4 and 6 kg to prop up and help him sleep in a  comfortable position. He reports recalling some dreams, but has not been suffering from nightmares, night terrors etc. he is not known to talk or act out dreams. He will have 2-3 bathroom breaks at night, he has not noted any nocturnal headaches, palpitation, diaphoresis, nausea or dizziness. His girlfriend starts working at 6:30 AM and has a over 1 hour commute, she leaves the house by 5:30. By 6 AM he rises to go to his work.  Sleep medical history and family sleep history: mother has OSA and is on CPAP- no childhood history of sleep walking, enuresis, night terrors, etc. This patient is hypertensive on 4 medications. DM was diagnosed at age 73, type 2, was placed on insulin while having heart surgery.   Social history: He has a remote history of smoking, quit in 2010, he drinks caffeine; ice tea 2 a week, Sodas- none, no coffee. 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  REVIEW OF SYSTEMS: Out of a complete 14 system review of symptoms, the patient complains only of the following symptoms, and all other reviewed systems are negative.  Drooling, leg swelling, light sensitivity,  eye itching, frequency of urination, urgency, joint pain, apnea, dizziness, headache ALLERGIES: No Known Allergies  HOME MEDICATIONS: Outpatient Medications Prior to Visit  Medication Sig Dispense Refill  . atorvastatin (LIPITOR) 20 MG tablet TAKE ONE TABLET BY MOUTH ONCE DAILY AT BEDTIME 90 tablet 3  . blood glucose meter kit and supplies KIT Dispense based on patient and insurance preference. Use QD-BID home glucose monitoring. (FOR ICD-9 250.00, 250.01). 1 each 0  . carvedilol (COREG) 25 MG tablet TAKE 1 TABLET BY MOUTH TWICE DAILY 180 tablet 3  . cholecalciferol (VITAMIN D) 1000 UNITS tablet Take 1,000 Units by mouth daily.    . fish oil-omega-3 fatty acids 1000 MG capsule Take 2 g by mouth daily.    Marland Kitchen glimepiride  (AMARYL) 4 MG tablet TAKE ONE TABLET BY MOUTH TWICE DAILY BEFORE MEAL(S) 60 tablet 5  . lisinopril-hydrochlorothiazide (PRINZIDE,ZESTORETIC) 20-12.5 MG tablet TAKE 2 TABLETS BY MOUTH ONCE DAILY 180 tablet 0  . Multiple Vitamin (MULTIVITAMIN) tablet Take 1 tablet by mouth daily.    . nitroGLYCERIN (NITROSTAT) 0.4 MG SL tablet DISSOLVE ONE TABLET UNDER THE TONGUE EVERY 5 MINUTES AS NEEDED FOR CHEST PAIN.  DO NOT EXCEED A TOTAL OF 3 DOSES IN 15 MINUTES 25 tablet 3  . amLODipine (NORVASC) 10 MG tablet Take 10 mg by mouth.    . metFORMIN (GLUCOPHAGE) 1000 MG tablet TAKE ONE TABLET BY MOUTH TWICE DAILY WITH MEALS 60 tablet 5   No facility-administered medications prior to visit.     PAST MEDICAL HISTORY: Past Medical History:  Diagnosis Date  . CAD (coronary artery disease)   . Diabetes mellitus   . Hyperlipidemia   . Hypertension     PAST SURGICAL HISTORY: Past Surgical History:  Procedure Laterality Date  . CORONARY ANGIOPLASTY  2010   LIMA to LAD, SVG to OM1,SVG to PDA  . CORONARY ARTERY BYPASS GRAFT      FAMILY HISTORY: Family History  Problem Relation Age of Onset  . Hypertension Mother   . Heart disease Father   . Hypertension Father   . Diabetes Father   . Hyperlipidemia Father     SOCIAL HISTORY: Social History   Socioeconomic History  . Marital status: Significant Other    Spouse name: [name]  . Number of children: 0  . Years of education: Associate's Degree  . Highest education level: Not on file  Social Needs  . Financial resource strain: Not on file  . Food insecurity - worry: Not on file  . Food insecurity - inability: Not on file  . Transportation needs - medical: Not on file  . Transportation needs - non-medical: Not on file  Occupational History  . Occupation: Public librarian: [employer]  Tobacco Use  . Smoking status: Former Smoker    Last attempt to quit: 09/17/2008    Years since quitting: 8.5  . Smokeless tobacco: Never Used    Substance and Sexual Activity  . Alcohol use: Yes    Comment: drink beer occas. (4-5 a week)  . Drug use: No  . Sexual activity: No  Other Topics Concern  . Not on file  Social History Narrative   Epworth Sleepiness Scale 01/10/2015 = 12/13      Lives alone.      PHYSICAL EXAM  Vitals:   03/18/17 0804 03/18/17 0832  BP: (!) 200/88 (!) 180/90  Pulse: 75   Weight: 273 lb (123.8 kg)   Height: 5' 6.75" (1.695 m)  Body mass index is 43.08 kg/m.  Generalized: Well developed, in no acute distress   Neurological examination  Mentation: Alert oriented to time, place, history taking. Follows all commands speech and language fluent Cranial nerve II-XII: Pupils were equal round reactive to light. Extraocular movements were full, visual field were full on confrontational test. Facial sensation and strength were normal. Uvula tongue midline. Head turning and shoulder shrug  were normal and symmetric. Motor: The motor testing reveals 5 over 5 strength of all 4 extremities. Good symmetric motor tone is noted throughout.  Sensory: Sensory testing is intact to soft touch on all 4 extremities. No evidence of extinction is noted.  Coordination: Cerebellar testing reveals good finger-nose-finger and heel-to-shin bilaterally.  Gait and station: Gait is normal.   DIAGNOSTIC DATA (LABS, IMAGING, TESTING) - I reviewed patient records, labs, notes, testing and imaging myself where available.  Lab Results  Component Value Date   WBC 11.3 (H) 08/18/2016   HGB 11.4 (L) 08/18/2016   HCT 34.4 (L) 08/18/2016   MCV 80 08/18/2016   PLT 415 (H) 08/18/2016      Component Value Date/Time   NA 143 08/18/2016 1644   K 4.3 08/18/2016 1644   CL 103 08/18/2016 1644   CO2 23 08/18/2016 1644   GLUCOSE 45 (L) 08/18/2016 1644   GLUCOSE 102 (H) 02/15/2016 1610   BUN 26 (H) 08/18/2016 1644   CREATININE 2.19 (H) 08/18/2016 1644   CREATININE 2.30 (H) 02/15/2016 1610   CALCIUM 9.7 08/18/2016 1644   PROT  7.0 08/18/2016 1644   ALBUMIN 3.9 08/18/2016 1644   AST 20 08/18/2016 1644   ALT 18 08/18/2016 1644   ALKPHOS 75 08/18/2016 1644   BILITOT 0.3 08/18/2016 1644   GFRNONAA 36 (L) 08/18/2016 1644   GFRNONAA 85 12/15/2013 1519   GFRAA 42 (L) 08/18/2016 1644   GFRAA >89 12/15/2013 1519   Lab Results  Component Value Date   CHOL 195 08/18/2016   HDL 38 (L) 08/18/2016   LDLCALC 109 (H) 08/18/2016   LDLDIRECT 116 (H) 08/10/2014   TRIG 240 (H) 08/18/2016   CHOLHDL 5.1 (H) 08/18/2016   Lab Results  Component Value Date   HGBA1C 7.5 08/18/2016   No results found for: [id number] Lab Results  Component Value Date   TSH 3.980 08/18/2016      ASSESSMENT AND PLAN 41 y.o. year old male  has a past medical history of CAD (coronary artery disease), Diabetes mellitus, Hyperlipidemia, and Hypertension. here with:  1.  Obstructive sleep apnea on CPAP  The patient CPAP download shows excellent compliance and good treatment of his apnea.  He is encouraged to continue using his CPAP nightly and for greater than 4 hours each night.  His blood pressure was elevated during today's visit.  The patient did not take his medication before the visit.  He is advised that if his symptoms worsen or he develops new symptoms he should let us know.  He will follow-up in 6 months or sooner if needed.  I spent 15 minutes with the patient. 50% of this time was spent reviewing his CPAP download     Ward Givens, MSN, NP-C 03/18/2017, 8:17 AM [facility] [address], [place], [place] [address] [phone]

## 2017-03-18 NOTE — Patient Instructions (Signed)
Your Plan:  Continue using CPAP nightly If your symptoms worsen or you develop new symptoms please let us know.   Thank you for coming to see us at [facility]. I hope we have been able to provide you high quality care today.  You may receive a patient satisfaction survey over the next few weeks. We would appreciate your feedback and comments so that we may continue to improve ourselves and the health of our patients.  

## 2017-03-18 NOTE — Progress Notes (Signed)
I agree with the assessment and plan as directed by NP .The patient is known to me .   Deanna Boehlke, MD  

## 2017-05-04 ENCOUNTER — Other Ambulatory Visit: Payer: Self-pay | Admitting: Physician Assistant

## 2017-05-04 DIAGNOSIS — E1165 Type 2 diabetes mellitus with hyperglycemia: Secondary | ICD-10-CM

## 2017-05-04 NOTE — Telephone Encounter (Signed)
Refill request; last seen by Harrison Mons 08/2016

## 2017-05-06 ENCOUNTER — Telehealth: Payer: Self-pay | Admitting: Physician Assistant

## 2017-05-06 NOTE — Telephone Encounter (Signed)
Yes we are aware.

## 2017-05-06 NOTE — Telephone Encounter (Signed)
Contacted pt regarding prescription refill request; he states that he has another provider and is not sure why this refill keeps going to [place]; will route to [place] to notification of this encounter.

## 2017-07-05 ENCOUNTER — Encounter (HOSPITAL_COMMUNITY): Payer: Self-pay | Admitting: Emergency Medicine

## 2017-07-05 ENCOUNTER — Other Ambulatory Visit: Payer: Self-pay

## 2017-07-05 DIAGNOSIS — I2510 Atherosclerotic heart disease of native coronary artery without angina pectoris: Secondary | ICD-10-CM | POA: Diagnosis present

## 2017-07-05 DIAGNOSIS — E1165 Type 2 diabetes mellitus with hyperglycemia: Secondary | ICD-10-CM | POA: Diagnosis present

## 2017-07-05 DIAGNOSIS — Z87891 Personal history of nicotine dependence: Secondary | ICD-10-CM

## 2017-07-05 DIAGNOSIS — D631 Anemia in chronic kidney disease: Secondary | ICD-10-CM | POA: Diagnosis present

## 2017-07-05 DIAGNOSIS — Z9861 Coronary angioplasty status: Secondary | ICD-10-CM

## 2017-07-05 DIAGNOSIS — E11319 Type 2 diabetes mellitus with unspecified diabetic retinopathy without macular edema: Secondary | ICD-10-CM | POA: Diagnosis present

## 2017-07-05 DIAGNOSIS — N186 End stage renal disease: Secondary | ICD-10-CM | POA: Diagnosis present

## 2017-07-05 DIAGNOSIS — Z6833 Body mass index (BMI) 33.0-33.9, adult: Secondary | ICD-10-CM

## 2017-07-05 DIAGNOSIS — N179 Acute kidney failure, unspecified: Secondary | ICD-10-CM | POA: Diagnosis present

## 2017-07-05 DIAGNOSIS — Z8249 Family history of ischemic heart disease and other diseases of the circulatory system: Secondary | ICD-10-CM

## 2017-07-05 DIAGNOSIS — E669 Obesity, unspecified: Secondary | ICD-10-CM | POA: Diagnosis present

## 2017-07-05 DIAGNOSIS — Z7984 Long term (current) use of oral hypoglycemic drugs: Secondary | ICD-10-CM

## 2017-07-05 DIAGNOSIS — Z7982 Long term (current) use of aspirin: Secondary | ICD-10-CM

## 2017-07-05 DIAGNOSIS — J9601 Acute respiratory failure with hypoxia: Secondary | ICD-10-CM | POA: Diagnosis present

## 2017-07-05 DIAGNOSIS — E785 Hyperlipidemia, unspecified: Secondary | ICD-10-CM | POA: Diagnosis present

## 2017-07-05 DIAGNOSIS — G4733 Obstructive sleep apnea (adult) (pediatric): Secondary | ICD-10-CM | POA: Diagnosis present

## 2017-07-05 DIAGNOSIS — Z833 Family history of diabetes mellitus: Secondary | ICD-10-CM

## 2017-07-05 DIAGNOSIS — E8889 Other specified metabolic disorders: Secondary | ICD-10-CM | POA: Diagnosis present

## 2017-07-05 DIAGNOSIS — N2581 Secondary hyperparathyroidism of renal origin: Secondary | ICD-10-CM | POA: Diagnosis present

## 2017-07-05 DIAGNOSIS — D509 Iron deficiency anemia, unspecified: Secondary | ICD-10-CM | POA: Diagnosis present

## 2017-07-05 DIAGNOSIS — Z8349 Family history of other endocrine, nutritional and metabolic diseases: Secondary | ICD-10-CM

## 2017-07-05 DIAGNOSIS — I5043 Acute on chronic combined systolic (congestive) and diastolic (congestive) heart failure: Secondary | ICD-10-CM | POA: Diagnosis present

## 2017-07-05 DIAGNOSIS — Z79899 Other long term (current) drug therapy: Secondary | ICD-10-CM

## 2017-07-05 DIAGNOSIS — I132 Hypertensive heart and chronic kidney disease with heart failure and with stage 5 chronic kidney disease, or end stage renal disease: Principal | ICD-10-CM | POA: Diagnosis present

## 2017-07-05 DIAGNOSIS — E1122 Type 2 diabetes mellitus with diabetic chronic kidney disease: Secondary | ICD-10-CM | POA: Diagnosis present

## 2017-07-05 DIAGNOSIS — Z951 Presence of aortocoronary bypass graft: Secondary | ICD-10-CM

## 2017-07-05 DIAGNOSIS — I161 Hypertensive emergency: Secondary | ICD-10-CM | POA: Diagnosis present

## 2017-07-05 NOTE — ED Triage Notes (Signed)
Pt reports SOB X2 weeks, getting worse tonight.  Pt O2 stats 86%, placed on 2L O2 [place], increased to 95%.  Reports he feels "girgling" when he breaths.

## 2017-07-06 ENCOUNTER — Encounter (HOSPITAL_COMMUNITY): Payer: Self-pay

## 2017-07-06 ENCOUNTER — Inpatient Hospital Stay (HOSPITAL_COMMUNITY)
Admission: EM | Admit: 2017-07-06 | Discharge: 2017-07-16 | DRG: 291 | Disposition: A | Discharge status: Home / Self Care | Payer: BLUE CROSS/BLUE SHIELD | Attending: Internal Medicine | Admitting: Internal Medicine

## 2017-07-06 ENCOUNTER — Emergency Department (HOSPITAL_COMMUNITY): Payer: BLUE CROSS/BLUE SHIELD

## 2017-07-06 ENCOUNTER — Inpatient Hospital Stay (HOSPITAL_COMMUNITY): Payer: BLUE CROSS/BLUE SHIELD

## 2017-07-06 ENCOUNTER — Other Ambulatory Visit: Payer: Self-pay

## 2017-07-06 DIAGNOSIS — I161 Hypertensive emergency: Secondary | ICD-10-CM | POA: Diagnosis present

## 2017-07-06 DIAGNOSIS — N179 Acute kidney failure, unspecified: Secondary | ICD-10-CM | POA: Diagnosis present

## 2017-07-06 DIAGNOSIS — Z09 Encounter for follow-up examination after completed treatment for conditions other than malignant neoplasm: Secondary | ICD-10-CM

## 2017-07-06 DIAGNOSIS — D631 Anemia in chronic kidney disease: Secondary | ICD-10-CM | POA: Diagnosis present

## 2017-07-06 DIAGNOSIS — E1165 Type 2 diabetes mellitus with hyperglycemia: Secondary | ICD-10-CM

## 2017-07-06 DIAGNOSIS — I132 Hypertensive heart and chronic kidney disease with heart failure and with stage 5 chronic kidney disease, or end stage renal disease: Secondary | ICD-10-CM | POA: Diagnosis present

## 2017-07-06 DIAGNOSIS — E1122 Type 2 diabetes mellitus with diabetic chronic kidney disease: Secondary | ICD-10-CM | POA: Diagnosis present

## 2017-07-06 DIAGNOSIS — N186 End stage renal disease: Secondary | ICD-10-CM | POA: Diagnosis present

## 2017-07-06 DIAGNOSIS — I10 Essential (primary) hypertension: Secondary | ICD-10-CM | POA: Diagnosis not present

## 2017-07-06 DIAGNOSIS — J9601 Acute respiratory failure with hypoxia: Secondary | ICD-10-CM | POA: Diagnosis present

## 2017-07-06 DIAGNOSIS — E669 Obesity, unspecified: Secondary | ICD-10-CM | POA: Diagnosis present

## 2017-07-06 DIAGNOSIS — Z833 Family history of diabetes mellitus: Secondary | ICD-10-CM | POA: Diagnosis not present

## 2017-07-06 DIAGNOSIS — E11319 Type 2 diabetes mellitus with unspecified diabetic retinopathy without macular edema: Secondary | ICD-10-CM | POA: Diagnosis present

## 2017-07-06 DIAGNOSIS — I2583 Coronary atherosclerosis due to lipid rich plaque: Secondary | ICD-10-CM | POA: Diagnosis not present

## 2017-07-06 DIAGNOSIS — Z9861 Coronary angioplasty status: Secondary | ICD-10-CM | POA: Diagnosis not present

## 2017-07-06 DIAGNOSIS — I2510 Atherosclerotic heart disease of native coronary artery without angina pectoris: Secondary | ICD-10-CM | POA: Diagnosis present

## 2017-07-06 DIAGNOSIS — Z6833 Body mass index (BMI) 33.0-33.9, adult: Secondary | ICD-10-CM | POA: Diagnosis not present

## 2017-07-06 DIAGNOSIS — I5023 Acute on chronic systolic (congestive) heart failure: Secondary | ICD-10-CM | POA: Diagnosis present

## 2017-07-06 DIAGNOSIS — E8889 Other specified metabolic disorders: Secondary | ICD-10-CM | POA: Diagnosis present

## 2017-07-06 DIAGNOSIS — Z8349 Family history of other endocrine, nutritional and metabolic diseases: Secondary | ICD-10-CM | POA: Diagnosis not present

## 2017-07-06 DIAGNOSIS — Z951 Presence of aortocoronary bypass graft: Secondary | ICD-10-CM

## 2017-07-06 DIAGNOSIS — Z992 Dependence on renal dialysis: Secondary | ICD-10-CM | POA: Diagnosis not present

## 2017-07-06 DIAGNOSIS — Z8249 Family history of ischemic heart disease and other diseases of the circulatory system: Secondary | ICD-10-CM | POA: Diagnosis not present

## 2017-07-06 DIAGNOSIS — E785 Hyperlipidemia, unspecified: Secondary | ICD-10-CM | POA: Diagnosis present

## 2017-07-06 DIAGNOSIS — N185 Chronic kidney disease, stage 5: Secondary | ICD-10-CM

## 2017-07-06 DIAGNOSIS — N189 Chronic kidney disease, unspecified: Secondary | ICD-10-CM | POA: Diagnosis present

## 2017-07-06 DIAGNOSIS — N2581 Secondary hyperparathyroidism of renal origin: Secondary | ICD-10-CM | POA: Diagnosis present

## 2017-07-06 DIAGNOSIS — G4733 Obstructive sleep apnea (adult) (pediatric): Secondary | ICD-10-CM | POA: Diagnosis present

## 2017-07-06 DIAGNOSIS — E1129 Type 2 diabetes mellitus with other diabetic kidney complication: Secondary | ICD-10-CM | POA: Diagnosis present

## 2017-07-06 DIAGNOSIS — D509 Iron deficiency anemia, unspecified: Secondary | ICD-10-CM | POA: Diagnosis present

## 2017-07-06 DIAGNOSIS — I5043 Acute on chronic combined systolic (congestive) and diastolic (congestive) heart failure: Secondary | ICD-10-CM | POA: Diagnosis present

## 2017-07-06 DIAGNOSIS — R0602 Shortness of breath: Secondary | ICD-10-CM | POA: Diagnosis present

## 2017-07-06 DIAGNOSIS — Z87891 Personal history of nicotine dependence: Secondary | ICD-10-CM | POA: Diagnosis not present

## 2017-07-06 LAB — RAPID URINE DRUG SCREEN, HOSP PERFORMED
Amphetamines: NOT DETECTED
Barbiturates: NOT DETECTED
Benzodiazepines: NOT DETECTED
COCAINE: NOT DETECTED
OPIATES: NOT DETECTED
TETRAHYDROCANNABINOL: NOT DETECTED

## 2017-07-06 LAB — IRON AND TIBC
Iron: 34 ug/dL — ABNORMAL LOW (ref 45–182)
Saturation Ratios: 13 % — ABNORMAL LOW (ref 17.9–39.5)
TIBC: 256 ug/dL (ref 250–450)
UIBC: 222 ug/dL

## 2017-07-06 LAB — LIPID PANEL
CHOL/HDL RATIO: 3.4 ratio
CHOLESTEROL: 104 mg/dL (ref 0–200)
HDL: 31 mg/dL — ABNORMAL LOW (ref >40)
LDL Cholesterol: 55 mg/dL (ref 0–99)
Triglycerides: 88 mg/dL (ref <150)
VLDL: 18 mg/dL (ref 0–40)

## 2017-07-06 LAB — I-STAT TROPONIN, ED: Troponin i, poc: 0.04 ng/mL (ref 0.00–0.08)

## 2017-07-06 LAB — URINALYSIS, ROUTINE W REFLEX MICROSCOPIC
Bilirubin Urine: NEGATIVE
GLUCOSE, UA: 50 mg/dL — AB
HGB URINE DIPSTICK: NEGATIVE
Ketones, ur: NEGATIVE mg/dL
LEUKOCYTES UA: NEGATIVE
NITRITE: NEGATIVE
PH: 5 (ref 5.0–8.0)
Protein, ur: >=300 mg/dL — AB
SPECIFIC GRAVITY, URINE: 1.01 (ref 1.005–1.030)
Squamous Epithelial / LPF: NONE SEEN

## 2017-07-06 LAB — BRAIN NATRIURETIC PEPTIDE: B NATRIURETIC PEPTIDE 5: 2431 pg/mL — AB (ref 0.0–100.0)

## 2017-07-06 LAB — CBC
HEMATOCRIT: 26.7 % — AB (ref 39.0–52.0)
HEMOGLOBIN: 8.5 g/dL — AB (ref 13.0–17.0)
MCH: 26.2 pg (ref 26.0–34.0)
MCHC: 31.8 g/dL (ref 30.0–36.0)
MCV: 82.4 fL (ref 78.0–100.0)
Platelets: 290 10*3/uL (ref 150–400)
RBC: 3.24 MIL/uL — ABNORMAL LOW (ref 4.22–5.81)
RDW: 15.4 % (ref 11.5–15.5)
WBC: 9.1 10*3/uL (ref 4.0–10.5)

## 2017-07-06 LAB — BASIC METABOLIC PANEL
Anion gap: 14 (ref 5–15)
BUN: 69 mg/dL — AB (ref 6–20)
CHLORIDE: 104 mmol/L (ref 101–111)
CO2: 22 mmol/L (ref 22–32)
Calcium: 8.4 mg/dL — ABNORMAL LOW (ref 8.9–10.3)
Creatinine, Ser: 8.86 mg/dL — ABNORMAL HIGH (ref 0.61–1.24)
GFR calc Af Amer: 8 mL/min — ABNORMAL LOW (ref >60)
GFR calc non Af Amer: 7 mL/min — ABNORMAL LOW (ref >60)
GLUCOSE: 176 mg/dL — AB (ref 65–99)
POTASSIUM: 4.1 mmol/L (ref 3.5–5.1)
Sodium: 140 mmol/L (ref 135–145)

## 2017-07-06 LAB — HEMOGLOBIN A1C
Hgb A1c MFr Bld: 6.5 % — ABNORMAL HIGH (ref 4.8–5.6)
Mean Plasma Glucose: 139.85 mg/dL

## 2017-07-06 LAB — FERRITIN: FERRITIN: 87 ng/mL (ref 24–336)

## 2017-07-06 LAB — RETICULOCYTES
RBC.: 2.98 MIL/uL — ABNORMAL LOW (ref 4.22–5.81)
RETIC COUNT ABSOLUTE: 38.7 10*3/uL (ref 19.0–186.0)
RETIC CT PCT: 1.3 % (ref 0.4–3.1)

## 2017-07-06 LAB — CBG MONITORING, ED
Glucose-Capillary: 178 mg/dL — ABNORMAL HIGH (ref 65–99)
Glucose-Capillary: 199 mg/dL — ABNORMAL HIGH (ref 65–99)
Glucose-Capillary: 180 mg/dL — ABNORMAL HIGH (ref 65–99)

## 2017-07-06 LAB — TROPONIN I
TROPONIN I: 0.07 ng/mL — AB (ref <0.03)
Troponin I: 0.07 ng/mL (ref <0.03)

## 2017-07-06 LAB — FOLATE: Folate: 12.4 ng/mL (ref >5.9)

## 2017-07-06 LAB — HIV ANTIBODY (ROUTINE TESTING W REFLEX): HIV SCREEN 4TH GENERATION: NONREACTIVE

## 2017-07-06 LAB — VITAMIN B12: VITAMIN B 12: 1006 pg/mL — AB (ref 180–914)

## 2017-07-06 MED ORDER — FUROSEMIDE 10 MG/ML IJ SOLN
80.0000 mg | Freq: Once | INTRAMUSCULAR | Status: AC
  Administered 2017-07-06: 80 mg via INTRAVENOUS
  Filled 2017-07-06: qty 8

## 2017-07-06 MED ORDER — AMLODIPINE BESYLATE 10 MG PO TABS
10.0000 mg | ORAL_TABLET | Freq: Every day | ORAL | Status: DC
  Administered 2017-07-07 – 2017-07-16 (×9): 10 mg via ORAL
  Filled 2017-07-06 (×8): qty 1
  Filled 2017-07-06: qty 2
  Filled 2017-07-06: qty 1

## 2017-07-06 MED ORDER — INSULIN ASPART 100 UNIT/ML ~~LOC~~ SOLN
0.0000 [IU] | Freq: Every day | SUBCUTANEOUS | Status: DC
  Administered 2017-07-07 – 2017-07-08 (×2): 2 [IU] via SUBCUTANEOUS
  Administered 2017-07-09: 3 [IU] via SUBCUTANEOUS
  Administered 2017-07-11: 2 [IU] via SUBCUTANEOUS
  Administered 2017-07-12 – 2017-07-15 (×4): 3 [IU] via SUBCUTANEOUS

## 2017-07-06 MED ORDER — ONDANSETRON HCL 4 MG/2ML IJ SOLN
4.0000 mg | Freq: Four times a day (QID) | INTRAMUSCULAR | Status: DC | PRN
  Administered 2017-07-14: 4 mg via INTRAVENOUS
  Filled 2017-07-06: qty 2

## 2017-07-06 MED ORDER — SODIUM CHLORIDE 0.9% FLUSH: 3.0000 mL | INTRAVENOUS | Status: DC | PRN

## 2017-07-06 MED ORDER — NITROGLYCERIN 0.4 MG SL SUBL: 0.4000 mg | SUBLINGUAL_TABLET | SUBLINGUAL | Status: DC | PRN

## 2017-07-06 MED ORDER — ALPRAZOLAM 0.25 MG PO TABS
0.2500 mg | ORAL_TABLET | Freq: Two times a day (BID) | ORAL | Status: DC | PRN
  Administered 2017-07-10: 0.25 mg via ORAL
  Filled 2017-07-06: qty 1

## 2017-07-06 MED ORDER — CLONIDINE HCL 0.2 MG PO TABS: 0.2000 mg | ORAL_TABLET | Freq: Three times a day (TID) | ORAL | Status: DC

## 2017-07-06 MED ORDER — CLONIDINE HCL 0.2 MG PO TABS
0.2000 mg | ORAL_TABLET | Freq: Three times a day (TID) | ORAL | Status: DC
  Administered 2017-07-06 – 2017-07-16 (×29): 0.2 mg via ORAL
  Filled 2017-07-06 (×32): qty 1

## 2017-07-06 MED ORDER — SODIUM CHLORIDE 0.9% FLUSH
3.0000 mL | Freq: Two times a day (BID) | INTRAVENOUS | Status: DC
  Administered 2017-07-06 – 2017-07-08 (×5): 3 mL via INTRAVENOUS

## 2017-07-06 MED ORDER — HYDRALAZINE HCL 20 MG/ML IJ SOLN
5.0000 mg | INTRAMUSCULAR | Status: DC | PRN
  Administered 2017-07-08: 5 mg via INTRAVENOUS
  Filled 2017-07-06: qty 1

## 2017-07-06 MED ORDER — ZOLPIDEM TARTRATE 5 MG PO TABS
5.0000 mg | ORAL_TABLET | Freq: Every evening | ORAL | Status: DC | PRN
Start: 2017-07-06 — End: 2017-07-16

## 2017-07-06 MED ORDER — CARVEDILOL 25 MG PO TABS
25.0000 mg | ORAL_TABLET | Freq: Two times a day (BID) | ORAL | Status: DC
  Administered 2017-07-06 – 2017-07-16 (×18): 25 mg via ORAL
  Filled 2017-07-06: qty 1
  Filled 2017-07-06: qty 2
  Filled 2017-07-06 (×11): qty 1
  Filled 2017-07-06: qty 2
  Filled 2017-07-06 (×6): qty 1

## 2017-07-06 MED ORDER — SODIUM CHLORIDE 0.9 % IV SOLN
250.0000 mL | INTRAVENOUS | Status: DC | PRN
  Administered 2017-07-07: 14:00:00 via INTRAVENOUS

## 2017-07-06 MED ORDER — ASPIRIN EC 81 MG PO TBEC
81.0000 mg | DELAYED_RELEASE_TABLET | Freq: Every day | ORAL | Status: DC
  Administered 2017-07-06 – 2017-07-16 (×10): 81 mg via ORAL
  Filled 2017-07-06 (×10): qty 1

## 2017-07-06 MED ORDER — FUROSEMIDE 10 MG/ML IJ SOLN
80.0000 mg | Freq: Two times a day (BID) | INTRAMUSCULAR | Status: DC
  Administered 2017-07-06: 80 mg via INTRAVENOUS
  Filled 2017-07-06: qty 8

## 2017-07-06 MED ORDER — ATORVASTATIN CALCIUM 20 MG PO TABS
20.0000 mg | ORAL_TABLET | Freq: Every day | ORAL | Status: DC
  Administered 2017-07-06 – 2017-07-16 (×10): 20 mg via ORAL
  Filled 2017-07-06 (×11): qty 1

## 2017-07-06 MED ORDER — ACETAMINOPHEN 325 MG PO TABS
650.0000 mg | ORAL_TABLET | ORAL | Status: DC | PRN
  Administered 2017-07-07 – 2017-07-09 (×5): 650 mg via ORAL
  Filled 2017-07-06 (×5): qty 2

## 2017-07-06 MED ORDER — NITROGLYCERIN IN D5W 200-5 MCG/ML-% IV SOLN
0.0000 ug/min | INTRAVENOUS | Status: DC
  Administered 2017-07-06 (×2): 175 ug/min via INTRAVENOUS
  Administered 2017-07-06: 5 ug/min via INTRAVENOUS
  Administered 2017-07-06: 170 ug/min via INTRAVENOUS
  Administered 2017-07-06: 200 ug/min via INTRAVENOUS
  Administered 2017-07-07: 175 ug/min via INTRAVENOUS
  Administered 2017-07-07: 100 ug/min via INTRAVENOUS
  Administered 2017-07-07: 191.667 ug/min via INTRAVENOUS
  Administered 2017-07-07 – 2017-07-08 (×3): 150 ug/min via INTRAVENOUS
  Administered 2017-07-09 (×2): 170 ug/min via INTRAVENOUS
  Administered 2017-07-09: 165 ug/min via INTRAVENOUS
  Administered 2017-07-09 (×2): 170 ug/min via INTRAVENOUS
  Administered 2017-07-10: 200 ug/min via INTRAVENOUS
  Administered 2017-07-10: 186.667 ug/min via INTRAVENOUS
  Administered 2017-07-10: 200 ug/min via INTRAVENOUS
  Filled 2017-07-06 (×21): qty 250

## 2017-07-06 MED ORDER — VITAMIN D 1000 UNITS PO TABS
1000.0000 [IU] | ORAL_TABLET | Freq: Every day | ORAL | Status: DC
  Administered 2017-07-06 – 2017-07-16 (×10): 1000 [IU] via ORAL
  Filled 2017-07-06 (×10): qty 1

## 2017-07-06 MED ORDER — ADULT MULTIVITAMIN W/MINERALS CH
1.0000 | ORAL_TABLET | Freq: Every day | ORAL | Status: DC
  Administered 2017-07-06 – 2017-07-16 (×10): 1 via ORAL
  Filled 2017-07-06 (×10): qty 1

## 2017-07-06 MED ORDER — INSULIN ASPART 100 UNIT/ML ~~LOC~~ SOLN
0.0000 [IU] | Freq: Three times a day (TID) | SUBCUTANEOUS | Status: DC
  Administered 2017-07-06 – 2017-07-07 (×4): 2 [IU] via SUBCUTANEOUS
  Administered 2017-07-08: 3 [IU] via SUBCUTANEOUS
  Administered 2017-07-08: 2 [IU] via SUBCUTANEOUS
  Administered 2017-07-09: 1 [IU] via SUBCUTANEOUS
  Administered 2017-07-09 – 2017-07-11 (×4): 3 [IU] via SUBCUTANEOUS
  Administered 2017-07-11 – 2017-07-12 (×2): 2 [IU] via SUBCUTANEOUS
  Administered 2017-07-12: 1 [IU] via SUBCUTANEOUS
  Administered 2017-07-13 (×2): 3 [IU] via SUBCUTANEOUS
  Administered 2017-07-13 – 2017-07-14 (×2): 2 [IU] via SUBCUTANEOUS
  Administered 2017-07-14: 5 [IU] via SUBCUTANEOUS
  Administered 2017-07-14: 3 [IU] via SUBCUTANEOUS
  Administered 2017-07-15: 2 [IU] via SUBCUTANEOUS
  Administered 2017-07-15: 1 [IU] via SUBCUTANEOUS
  Administered 2017-07-16: 2 [IU] via SUBCUTANEOUS
  Filled 2017-07-06 (×2): qty 1

## 2017-07-06 MED ORDER — HEPARIN SODIUM (PORCINE) 5000 UNIT/ML IJ SOLN
5000.0000 [IU] | Freq: Three times a day (TID) | INTRAMUSCULAR | Status: DC
  Administered 2017-07-06 – 2017-07-15 (×24): 5000 [IU] via SUBCUTANEOUS
  Filled 2017-07-06 (×24): qty 1

## 2017-07-06 NOTE — ED Notes (Signed)
Spoke with admitting MD reports SBP goal 160-180. Give oral BP meds.

## 2017-07-06 NOTE — Consult Note (Signed)
[facility] NOTE   MRN : [id number]  Reason for Consult: ESRD Referring Physician: Dr. Justin Mend  History of Present Illness: 42 y/o male presents to the ED with a chief complaint of SOB that has gotten worse over the last 3 months.  He was found to have acute on chronic kidney injury and hypertensive emergency.  HTN 233/97, Cr 8.86 and GFR <9.  We have been asked to provide a tunnel dialysis catheter so he can be started on HD tomorrow.    He has a medical history of CKD stage V, HTN, DM, CAD s/p CABG 2010, Hyperlipidemia.     Current Facility-Administered Medications  Medication Dose Route Frequency Provider Last Rate Last Dose  . 0.9 %  sodium chloride infusion  250 mL Intravenous PRN Ivor Costa, MD      . acetaminophen (TYLENOL) tablet 650 mg  650 mg Oral Q4H PRN Ivor Costa, MD      . ALPRAZolam Duanne Moron) tablet 0.25 mg  0.25 mg Oral BID PRN Ivor Costa, MD      . amLODipine (NORVASC) tablet 10 mg  10 mg Oral Daily Ivor Costa, MD      . aspirin EC tablet 81 mg  81 mg Oral Daily Ivor Costa, MD   81 mg at 07/06/17 8546  . atorvastatin (LIPITOR) tablet 20 mg  20 mg Oral Q breakfast Ivor Costa, MD   20 mg at 07/06/17 2703  . carvedilol (COREG) tablet 25 mg  25 mg Oral BID WC Ivor Costa, MD   25 mg at 07/06/17 0951  . cholecalciferol (VITAMIN D) tablet 1,000 Units  1,000 Units Oral Daily Ivor Costa, MD   1,000 Units at 07/06/17 [phone]  . cloNIDine (CATAPRES) tablet 0.2 mg  0.2 mg Oral TID Ivor Costa, MD   0.2 mg at 07/06/17 1526  . furosemide (LASIX) injection 80 mg  80 mg Intravenous BID Ivor Costa, MD   80 mg at 07/06/17 1013  . heparin injection 5,000 Units  5,000 Units Subcutaneous Q8H Ivor Costa, MD   5,000 Units at 07/06/17 1406  . hydrALAZINE (APRESOLINE) injection 5 mg  5 mg Intravenous Q2H PRN Ivor Costa, MD      . insulin aspart (novoLOG) injection 0-5 Units  0-5 Units Subcutaneous QHS Ivor Costa, MD      . insulin aspart (novoLOG) injection 0-9 Units   0-9 Units Subcutaneous TID WC Ivor Costa, MD   2 Units at 07/06/17 1251  . multivitamin with minerals tablet 1 tablet  1 tablet Oral Daily Ivor Costa, MD   1 tablet at 07/06/17 [phone]  . nitroGLYCERIN (NITROSTAT) SL tablet 0.4 mg  0.4 mg Sublingual Q5 min PRN Ivor Costa, MD      . nitroGLYCERIN 50 mg in dextrose 5 % 250 mL (0.2 mg/mL) infusion  0-200 mcg/min Intravenous Titrated Ivor Costa, MD 51 mL/hr at 07/06/17 1527 170 mcg/min at 07/06/17 1527  . ondansetron (ZOFRAN) injection 4 mg  4 mg Intravenous Q6H PRN Ivor Costa, MD      . sodium chloride flush (NS) 0.9 % injection 3 mL  3 mL Intravenous Q12H Ivor Costa, MD   3 mL at 07/06/17 1017  . sodium chloride flush (NS) 0.9 % injection 3 mL  3 mL Intravenous PRN Ivor Costa, MD      . zolpidem (AMBIEN) tablet 5 mg  5 mg Oral QHS PRN,MR X 1 Ivor Costa, MD       Current Outpatient Medications  Medication Sig Dispense Refill  . atorvastatin (LIPITOR) 20 MG tablet TAKE ONE TABLET BY MOUTH ONCE DAILY AT BEDTIME 90 tablet 3  . carvedilol (COREG) 25 MG tablet TAKE 1 TABLET BY MOUTH TWICE DAILY 180 tablet 3  . cholecalciferol (VITAMIN D) 1000 UNITS tablet Take 1,000 Units by mouth daily.    . cloNIDine (CATAPRES) 0.1 MG tablet Take 0.1 mg by mouth 2 (two) times daily.  1  . fish oil-omega-3 fatty acids 1000 MG capsule Take 2 g by mouth daily.    Marland Kitchen glimepiride (AMARYL) 4 MG tablet TAKE ONE TABLET BY MOUTH TWICE DAILY BEFORE MEAL(S) (Patient taking differently: TAKE 2 MG BY MOUTH ONCE DAILY) 60 tablet 5  . Multiple Vitamin (MULTIVITAMIN) tablet Take 1 tablet by mouth daily.    . nitroGLYCERIN (NITROSTAT) 0.4 MG SL tablet DISSOLVE ONE TABLET UNDER THE TONGUE EVERY 5 MINUTES AS NEEDED FOR CHEST PAIN.  DO NOT EXCEED A TOTAL OF 3 DOSES IN 15 MINUTES 25 tablet 3  . torsemide (DEMADEX) 20 MG tablet Take 40 mg by mouth 2 (two) times daily.  3  . blood glucose meter kit and supplies KIT Dispense based on patient and insurance preference. Use QD-BID home glucose  monitoring. (FOR ICD-9 250.00, 250.01). 1 each 0    Pt meds include: Statin :Yes Betablocker: Yes ASA: No Other anticoagulants/antiplatelets: none  Past Medical History:  Diagnosis Date  . CAD (coronary artery disease)   . Diabetes mellitus   . Hyperlipidemia   . Hypertension     Past Surgical History:  Procedure Laterality Date  . CORONARY ANGIOPLASTY  2010   LIMA to LAD, SVG to OM1,SVG to PDA  . CORONARY ARTERY BYPASS GRAFT      Social History Social History   Tobacco Use  . Smoking status: Former Smoker    Last attempt to quit: 09/17/2008    Years since quitting: 8.8  . Smokeless tobacco: Never Used  Substance Use Topics  . Alcohol use: Yes    Comment: drink beer occas. (4-5 a week)  . Drug use: No    Family History Family History  Problem Relation Age of Onset  . Hypertension Mother   . Heart disease Father   . Hypertension Father   . Diabetes Father   . Hyperlipidemia Father     No Known Allergies   REVIEW OF SYSTEMS  General: '[ ]'$  Weight loss, '[ ]'$  Fever, '[ ]'$  chills Neurologic: '[ ]'$  Dizziness, '[ ]'$  Blackouts, '[ ]'$  Seizure '[ ]'$  Stroke, '[ ]'$  "Mini stroke", '[ ]'$  Slurred speech, '[ ]'$  Temporary blindness; '[ ]'$  weakness in arms or legs, '[ ]'$  Hoarseness '[ ]'$  Dysphagia Cardiac: '[ ]'$  Chest pain/pressure, '[ ]'$  Shortness of breath at rest [x ] Shortness of breath with exertion, '[ ]'$  Atrial fibrillation or irregular heartbeat  Vascular: '[ ]'$  Pain in legs with walking, '[ ]'$  Pain in legs at rest, '[ ]'$  Pain in legs at night,  '[ ]'$  Non-healing ulcer, '[ ]'$  Blood clot in vein/DVT,   Pulmonary: '[ ]'$  Home oxygen, '[ ]'$  Productive cough, '[ ]'$  Coughing up blood, '[ ]'$  Asthma,  '[ ]'$  Wheezing '[ ]'$  COPD Musculoskeletal:  '[ ]'$  Arthritis, '[ ]'$  Low back pain, '[ ]'$  Joint pain '[x]'$  B LE edema Hematologic: '[ ]'$  Easy Bruising, [x ] Anemia; '[ ]'$  Hepatitis Gastrointestinal: '[ ]'$  Blood in stool, '[ ]'$  Gastroesophageal Reflux/heartburn, Urinary: [x ] chronic Kidney disease, '[ ]'$  on HD - '[ ]'$  MWF or '[ ]'$  TTHS, '[ ]'$   Burning  with urination, '[ ]'$  Difficulty urinating Skin: '[ ]'$  Rashes, '[ ]'$  Wounds Psychological: '[ ]'$  Anxiety, '[ ]'$  Depression  Physical Examination Vitals:   07/06/17 1330 07/06/17 1400 07/06/17 1430 07/06/17 1526  BP: (!) 171/86 (!) 154/73 (!) 154/77 (!) 185/90  Pulse: 65 66 66   Resp: '15 15 14   '$ Temp:      TempSrc:      SpO2: 100% 100% 100%    There is no height or weight on file to calculate BMI.  General:  WDWN in NAD, obese HENT: WNL, normocephalic Eyes: Pupils equal Pulmonary: normal non-labored breathing at rest, without Rales, rhonchi,  wheezing Cardiac: RRR, without  Murmurs, rubs or gallops; No carotid bruits Abdomen: soft, NT, no masses Skin: no rashes, ulcers noted;  no Gangrene , no cellulitis; no open wounds;   Vascular Exam/Pulses:Palpablr radial pulses B, non palpable DP/PT secondary to edema, feet warm to touch   Musculoskeletal: no muscle wasting or atrophy; sever B LE edema, with evidence of lymph edema.  Neurologic: A&O X 3; Appropriate Affect ;  SENSATION: normal; MOTOR FUNCTION: grossly 5/5 Symmetric Speech is fluent/normal   Significant Diagnostic Studies: CBC Lab Results  Component Value Date   WBC 9.1 07/06/2017   HGB 8.5 (L) 07/06/2017   HCT 26.7 (L) 07/06/2017   MCV 82.4 07/06/2017   PLT 290 07/06/2017    BMET    Component Value Date/Time   NA 140 07/06/2017 0002   NA 143 08/18/2016 1644   K 4.1 07/06/2017 0002   CL 104 07/06/2017 0002   CO2 22 07/06/2017 0002   GLUCOSE 176 (H) 07/06/2017 0002   BUN 69 (H) 07/06/2017 0002   BUN 26 (H) 08/18/2016 1644   CREATININE 8.86 (H) 07/06/2017 0002   CREATININE 2.30 (H) 02/15/2016 1610   CALCIUM 8.4 (L) 07/06/2017 0002   GFRNONAA 7 (L) 07/06/2017 0002   GFRNONAA 85 12/15/2013 1519   GFRAA 8 (L) 07/06/2017 0002   GFRAA >89 12/15/2013 1519   CrCl cannot be calculated (Unknown ideal weight.).  COAG Lab Results  Component Value Date   INR 1.4 12/18/2008   INR 1.0 12/17/2008     Non-Invasive  Vascular Imaging: Pending vein mapping  ASSESSMENT/PLAN:  Acute on CKD stage V.   Pulmonary edema, volume over load diuresis with IV lasix  Hypertensive emergency on Nitro drip  Plan for placement of tunneled dialysis catheter tomorrow.  We will order vein mapping to provide future permanent access.  He is right hand dominant.   NPO past MN.   Roxy Horseman 07/06/2017 3:36 PM

## 2017-07-06 NOTE — ED Provider Notes (Signed)
[name] Provider Note   CSN: [id number] Arrival date & time: 07/05/17  2334     History   Chief Complaint Chief Complaint  Patient presents with  . Shortness of Breath    HPI [name] is a 42 y.o. male.  HPI   Patient is a 42 year old male with past medical history significant for CABG, diabetes, hypertension, hyperlipidemia and heart failure.  In October patient started on turosemide and recently increased it to BID when he last but is visited cardiology a week ago.  Patient has noticed increasing shortness of breath over the course of the last month, however in the last week is gotten much worse.  Patient is also noticed bilateral leg swelling.  Has difficulty lying down at night due to shortness of breath.  Patient hypoxic on arrival to the ED.  Past Medical History:  Diagnosis Date  . CAD (coronary artery disease)   . Diabetes mellitus   . Hyperlipidemia   . Hypertension     Patient Active Problem List   Diagnosis Date Noted  . Suspected sleep apnea 08/18/2016  . Seizure ([place]) 03/25/2015  . Observed seizure-like activity ([place]) 03/25/2015  . 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 Diabetes type 2, uncontrolled ([place]) 04/21/2013  . Blurred vision 11/20/2011  . BMI 39.0-39.9,adult 11/19/2011  . Hypertension 05/11/2011  . Hyperlipidemia 05/11/2011  . CAD (coronary artery disease) 05/11/2011    Past Surgical History:  Procedure Laterality Date  . CORONARY ANGIOPLASTY  2010   LIMA to LAD, SVG to OM1,SVG to PDA  . CORONARY ARTERY BYPASS GRAFT          Home Medications    Prior to Admission medications   Medication Sig Start Date End Date Taking? Authorizing Provider  amLODipine (NORVASC) 10 MG tablet Take 10 mg by mouth. 11/03/16 02/01/17  [provider]  atorvastatin (LIPITOR) 20 MG tablet TAKE ONE TABLET BY MOUTH ONCE DAILY AT BEDTIME 09/17/16   Jeffery, Chelle, PA-C  blood glucose meter kit and supplies KIT  Dispense based on patient and insurance preference. Use QD-BID home glucose monitoring. (FOR ICD-9 250.00, 250.01). 08/18/16   Kenneth Mons, PA-C  carvedilol (COREG) 25 MG tablet TAKE 1 TABLET BY MOUTH TWICE DAILY 10/13/16   Kenneth Mons, PA-C  cholecalciferol (VITAMIN D) 1000 UNITS tablet Take 1,000 Units by mouth daily.    [provider]  fish oil-omega-3 fatty acids 1000 MG capsule Take 2 g by mouth daily.    [provider]  glimepiride (AMARYL) 4 MG tablet TAKE ONE TABLET BY MOUTH TWICE DAILY BEFORE MEAL(S) 09/29/16   Kenneth Mons, PA-C  lisinopril-hydrochlorothiazide (PRINZIDE,ZESTORETIC) 20-12.5 MG tablet TAKE 2 TABLETS BY MOUTH ONCE DAILY 09/05/16   Kenneth Mons, PA-C  metFORMIN (GLUCOPHAGE) 1000 MG tablet TAKE ONE TABLET BY MOUTH TWICE DAILY WITH MEALS 09/29/16   Kenneth Mons, PA-C  Multiple Vitamin (MULTIVITAMIN) tablet Take 1 tablet by mouth daily.    [provider]  nitroGLYCERIN (NITROSTAT) 0.4 MG SL tablet DISSOLVE ONE TABLET UNDER THE TONGUE EVERY 5 MINUTES AS NEEDED FOR CHEST PAIN.  DO NOT EXCEED A TOTAL OF 3 DOSES IN 15 MINUTES 08/06/16   Hilty, Nadean Corwin, MD    Family History Family History  Problem Relation Age of Onset  . Hypertension Mother   . Heart disease Father   . Hypertension Father   . Diabetes Father   . Hyperlipidemia Father     Social History Social History   Tobacco  Use  . Smoking status: Former Smoker    Last attempt to quit: 09/17/2008    Years since quitting: 8.8  . Smokeless tobacco: Never Used  Substance Use Topics  . Alcohol use: Yes    Comment: drink beer occas. (4-5 a week)  . Drug use: No     Allergies   Patient has no known allergies.   Review of Systems Review of Systems  Constitutional: Positive for fatigue. Negative for activity change.  Respiratory: Positive for shortness of breath.   Cardiovascular: Positive for leg swelling. Negative for chest pain.  Gastrointestinal: Negative for abdominal  pain.  All other systems reviewed and are negative.    Physical Exam Updated Vital Signs BP (!) 233/97   Pulse 77   Temp 98.8 F (37.1 C) (Oral)   Resp (!) 24   SpO2 99%   Physical Exam  Constitutional: He is oriented to person, place, and time. He appears well-developed and well-nourished.  HENT:  Head: Normocephalic and atraumatic.  Eyes: Conjunctivae are normal.  Neck: Neck supple.  Cardiovascular: Normal rate and regular rhythm.  No murmur heard. Pulmonary/Chest: Effort normal. Tachypnea noted. No respiratory distress. He has decreased breath sounds. He has rales.  Abdominal: Soft. Bowel sounds are normal. There is no tenderness.  Musculoskeletal:       Right lower leg: He exhibits edema.       Left lower leg: He exhibits edema.  Neurological: He is alert and oriented to person, place, and time.  Skin: Skin is warm and dry.  Psychiatric: He has a normal mood and affect.  Nursing note and vitals reviewed.    ED Treatments / Results  Labs (all labs ordered are listed, but only abnormal results are displayed) Labs Reviewed  BASIC METABOLIC PANEL - Abnormal; Notable for the following components:      Result Value   Glucose, Bld 176 (*)    BUN 69 (*)    Creatinine, Ser 8.86 (*)    Calcium 8.4 (*)    GFR calc non Af Amer 7 (*)    GFR calc Af Amer 8 (*)    All other components within normal limits  CBC - Abnormal; Notable for the following components:   RBC 3.24 (*)    Hemoglobin 8.5 (*)    HCT 26.7 (*)    All other components within normal limits  BRAIN NATRIURETIC PEPTIDE  VITAMIN B12  FOLATE  IRON AND TIBC  FERRITIN  RETICULOCYTES  I-STAT TROPONIN, ED  TYPE AND SCREEN    EKG EKG Interpretation  Date/Time:  Sunday July 05 2017 23:40:26 EDT Ventricular Rate:  81 PR Interval:  158 QRS Duration: 102 QT Interval:  408 QTC Calculation: 473 R Axis:   100 Text Interpretation:  Sinus rhythm with frequent Premature ventricular complexes No acute changes  No significant change since last tracing Confirmed by [name], [name] [phone]) on 07/06/2017 1:10:30 AM   Radiology Dg Chest 2 View  Result Date: 07/06/2017 CLINICAL DATA:  Shortness of breath EXAM: CHEST - 2 VIEW COMPARISON:  None. FINDINGS: Remote median sternotomy for CABG. Cardiomegaly. Small pleural effusions, right greater than left. Moderate alveolar edema. IMPRESSION: Cardiomegaly and moderate alveolar pulmonary edema. Small pleural effusions. Findings suggest congestive heart failure. Electronically Signed   By: Ulyses Jarred M.D.   On: 07/06/2017 00:43    Procedures Procedures (including critical care time)  CRITICAL CARE Performed by: Gardiner Sleeper Total critical care time: 35 minutes Critical care time was exclusive of separately  billable procedures and treating other patients. Critical care was necessary to treat or prevent imminent or life-threatening deterioration. Critical care was time spent personally by me on the following activities: development of treatment plan with patient and/or surrogate as well as nursing, discussions with consultants, evaluation of patient's response to treatment, examination of patient, obtaining history from patient or surrogate, ordering and performing treatments and interventions, ordering and review of laboratory studies, ordering and review of radiographic studies, pulse oximetry and re-evaluation of patient's condition.   Medications Ordered in ED Medications  furosemide (LASIX) injection 80 mg (has no administration in time range)     Initial Impression / Assessment and Plan / ED Course  I have reviewed the triage vital signs and the nursing notes.  Pertinent labs & imaging results that were available during my care of the patient were reviewed by me and considered in my medical decision making (see chart for details).     Patient is a 42 year old male with past medical history significant for CABG, diabetes, hypertension,  hyperlipidemia and heart failure.  In October patient started on turosemide and recently increased it to BID when he last but is visited cardiology a week ago.  Patient has noticed increasing shortness of breath over the course of the last month, however in the last week is gotten much worse.  Patient is also noticed bilateral leg swelling.  Has difficulty lying down at night due to shortness of breath.  Patient hypoxic on arrival to the ED.  1:10 AM Patient tachypneic, hypoxic on arrival.  Improved with oxygen.  Patient has crackles in bilateral fields.  X-ray consistent with fluid overload.  Will start IV diuresis and admit.   Pt also noted to be anemic, likely anemia of chronic disease given chronic kidney disease.  However will send anemia panel as well and type and screen   Patient noted to be in acute renal failure. Cr is 8 today.   Discussed with hosptilist. Will admit, increase dose of diuretics and consult nephrology in the morning. .  Final Clinical Impressions(s) / ED Diagnoses   Final diagnoses:  None    ED Discharge Orders    None       Macarthur Critchley, MD 07/08/17 [phone]

## 2017-07-06 NOTE — Consult Note (Signed)
Referring Provider: No ref. provider found Primary Care Physician:  Larene Beach, MD Primary Nephrologist:  Dr. Alex Gardener  Reason for Consultation:   Stage 5 chronic renal disease  Hypertension anemia and secondary HPT  HPI:   [name] is a 42 y.o. malewith medical history significant ofCKD-V, hypertension, hyperlipidemia, diabetes mellitus, CAD, CABG, sCHF with EF 45%, obesity. He presented with dyspnea and leg edema, found to have acute on chronic kidney injury and hypertensive emergency. Patient has been started on Lasix, home antihypertensives and nitro gtt.  Patient followed [facility] but lives in [place]   Has had a histroy of increased dyspnea at rest and to conversation for several weeks  He also has some worsening pulmonary edema   He has had diabetes for over 20 years complicated by CABG and also retinopathy  He is willing to start dialysis   He is to get married in June  Past Medical History:  Diagnosis Date  . CAD (coronary artery disease)   . Diabetes mellitus   . Hyperlipidemia   . Hypertension     Past Surgical History:  Procedure Laterality Date  . CORONARY ANGIOPLASTY  2010   LIMA to LAD, SVG to OM1,SVG to PDA  . CORONARY ARTERY BYPASS GRAFT      Prior to Admission medications   Medication Sig Start Date End Date Taking? Authorizing Provider  atorvastatin (LIPITOR) 20 MG tablet TAKE ONE TABLET BY MOUTH ONCE DAILY AT BEDTIME 09/17/16  Yes Jeffery, Chelle, PA-C  carvedilol (COREG) 25 MG tablet TAKE 1 TABLET BY MOUTH TWICE DAILY 10/13/16  Yes Jeffery, Chelle, PA-C  cholecalciferol (VITAMIN D) 1000 UNITS tablet Take 1,000 Units by mouth daily.   Yes [provider]  cloNIDine (CATAPRES) 0.1 MG tablet Take 0.1 mg by mouth 2 (two) times daily. 06/24/17  Yes [provider]  fish oil-omega-3 fatty acids 1000 MG capsule Take 2 g by mouth daily.   Yes [provider]  glimepiride (AMARYL) 4 MG tablet TAKE ONE TABLET BY MOUTH TWICE DAILY BEFORE  MEAL(S) Patient taking differently: TAKE 2 MG BY MOUTH ONCE DAILY 09/29/16  Yes Jeffery, Chelle, PA-C  Multiple Vitamin (MULTIVITAMIN) tablet Take 1 tablet by mouth daily.   Yes [provider]  nitroGLYCERIN (NITROSTAT) 0.4 MG SL tablet DISSOLVE ONE TABLET UNDER THE TONGUE EVERY 5 MINUTES AS NEEDED FOR CHEST PAIN.  DO NOT EXCEED A TOTAL OF 3 DOSES IN 15 MINUTES 08/06/16  Yes Hilty, Nadean Corwin, MD  torsemide (DEMADEX) 20 MG tablet Take 40 mg by mouth 2 (two) times daily. 06/29/17  Yes [provider]  blood glucose meter kit and supplies KIT Dispense based on patient and insurance preference. Use QD-BID home glucose monitoring. (FOR ICD-9 250.00, 250.01). 08/18/16   Harrison Mons, PA-C    Current Facility-Administered Medications  Medication Dose Route Frequency Provider Last Rate Last Dose  . 0.9 %  sodium chloride infusion  250 mL Intravenous PRN Ivor Costa, MD      . acetaminophen (TYLENOL) tablet 650 mg  650 mg Oral Q4H PRN Ivor Costa, MD      . ALPRAZolam Duanne Moron) tablet 0.25 mg  0.25 mg Oral BID PRN Ivor Costa, MD      . amLODipine (NORVASC) tablet 10 mg  10 mg Oral Daily Ivor Costa, MD      . aspirin EC tablet 81 mg  81 mg Oral Daily Ivor Costa, MD   81 mg at 07/06/17 7253  . atorvastatin (LIPITOR) tablet 20 mg  20 mg Oral Q breakfast Ivor Costa, MD   20 mg at 07/06/17 7902  . carvedilol (COREG) tablet 25 mg  25 mg Oral BID WC Ivor Costa, MD   25 mg at 07/06/17 0951  . cholecalciferol (VITAMIN D) tablet 1,000 Units  1,000 Units Oral Daily Ivor Costa, MD   1,000 Units at 07/06/17 [phone]  . cloNIDine (CATAPRES) tablet 0.2 mg  0.2 mg Oral TID Ivor Costa, MD   0.2 mg at 07/06/17 1033  . furosemide (LASIX) injection 80 mg  80 mg Intravenous BID Ivor Costa, MD   80 mg at 07/06/17 1013  . heparin injection 5,000 Units  5,000 Units Subcutaneous Q8H Ivor Costa, MD   5,000 Units at 07/06/17 1406  . hydrALAZINE (APRESOLINE) injection 5 mg  5 mg Intravenous Q2H PRN Ivor Costa, MD      .  insulin aspart (novoLOG) injection 0-5 Units  0-5 Units Subcutaneous QHS Ivor Costa, MD      . insulin aspart (novoLOG) injection 0-9 Units  0-9 Units Subcutaneous TID WC Ivor Costa, MD   2 Units at 07/06/17 1251  . multivitamin with minerals tablet 1 tablet  1 tablet Oral Daily Ivor Costa, MD   1 tablet at 07/06/17 [phone]  . nitroGLYCERIN (NITROSTAT) SL tablet 0.4 mg  0.4 mg Sublingual Q5 min PRN Ivor Costa, MD      . nitroGLYCERIN 50 mg in dextrose 5 % 250 mL (0.2 mg/mL) infusion  0-200 mcg/min Intravenous Titrated Ivor Costa, MD 51 mL/hr at 07/06/17 1407 170 mcg/min at 07/06/17 1407  . ondansetron (ZOFRAN) injection 4 mg  4 mg Intravenous Q6H PRN Ivor Costa, MD      . sodium chloride flush (NS) 0.9 % injection 3 mL  3 mL Intravenous Q12H Ivor Costa, MD   3 mL at 07/06/17 1017  . sodium chloride flush (NS) 0.9 % injection 3 mL  3 mL Intravenous PRN Ivor Costa, MD      . zolpidem (AMBIEN) tablet 5 mg  5 mg Oral QHS PRN,MR X 1 Ivor Costa, MD       Current Outpatient Medications  Medication Sig Dispense Refill  . atorvastatin (LIPITOR) 20 MG tablet TAKE ONE TABLET BY MOUTH ONCE DAILY AT BEDTIME 90 tablet 3  . carvedilol (COREG) 25 MG tablet TAKE 1 TABLET BY MOUTH TWICE DAILY 180 tablet 3  . cholecalciferol (VITAMIN D) 1000 UNITS tablet Take 1,000 Units by mouth daily.    . cloNIDine (CATAPRES) 0.1 MG tablet Take 0.1 mg by mouth 2 (two) times daily.  1  . fish oil-omega-3 fatty acids 1000 MG capsule Take 2 g by mouth daily.    Marland Kitchen glimepiride (AMARYL) 4 MG tablet TAKE ONE TABLET BY MOUTH TWICE DAILY BEFORE MEAL(S) (Patient taking differently: TAKE 2 MG BY MOUTH ONCE DAILY) 60 tablet 5  . Multiple Vitamin (MULTIVITAMIN) tablet Take 1 tablet by mouth daily.    . nitroGLYCERIN (NITROSTAT) 0.4 MG SL tablet DISSOLVE ONE TABLET UNDER THE TONGUE EVERY 5 MINUTES AS NEEDED FOR CHEST PAIN.  DO NOT EXCEED A TOTAL OF 3 DOSES IN 15 MINUTES 25 tablet 3  . torsemide (DEMADEX) 20 MG tablet Take 40 mg by mouth 2 (two)  times daily.  3  . blood glucose meter kit and supplies KIT Dispense based on patient and insurance preference. Use QD-BID home glucose monitoring. (FOR ICD-9 250.00, 250.01). 1 each 0    Allergies as of 07/05/2017  . (No Known Allergies)    Family History  Problem Relation Age of Onset  . Hypertension Mother   . Heart disease Father   . Hypertension Father   . Diabetes Father   . Hyperlipidemia Father     Social History   Socioeconomic History  . Marital status: Significant Other    Spouse name: [name]  . Number of children: 0  . Years of education: Associate's Degree  . Highest education level: Not on file  Occupational History  . Occupation: Public librarian: [employer]  . Financial resource strain: Not on file  . Food insecurity:    Worry: Not on file    Inability: Not on file  . Transportation needs:    Medical: Not on file    Non-medical: Not on file  Tobacco Use  . Smoking status: Former Smoker    Last attempt to quit: 09/17/2008    Years since quitting: 8.8  . Smokeless tobacco: Never Used  Substance and Sexual Activity  . Alcohol use: Yes    Comment: drink beer occas. (4-5 a week)  . Drug use: No  . Sexual activity: Never  Lifestyle  . Physical activity:    Days per week: Not on file    Minutes per session: Not on file  . Stress: Not on file  Relationships  . Social connections:    Talks on phone: Not on file    Gets together: Not on file    Attends religious service: Not on file    Active member of club or organization: Not on file    Attends meetings of clubs or organizations: Not on file    Relationship status: Not on file  . Intimate partner violence:    Fear of current or ex partner: Not on file    Emotionally abused: Not on file    Physically abused: Not on file    Forced sexual activity: Not on file  Other Topics Concern  . Not on file  Social History Narrative   Epworth Sleepiness Scale 01/10/2015 =  12/13      Lives alone.    Review of Systems: Gen: Denies any fever, chills, sweats, anorexia, fatigue, weakness, malaise, weight loss, and sleep disorder HEENT: No visual complaints, No history of Retinopathy. Normal external appearance No Epistaxis or Sore throat. No sinusitis.   CV:  Increased SOB  Lower extremity swelling. Resp: Denies  cough, sputum, wheezing, coughing up blood, and pleurisy. GI: Denies vomiting blood, jaundice, and fecal incontinence.   Denies dysphagia or odynophagia. GU : Denies urinary burning, blood in urine, urinary frequency, urinary hesitancy, nocturnal urination, and urinary incontinence.  No renal calculi. MS: Denies joint pain, limitation of movement, and swelling, stiffness, low back pain, extremity pain. Some  muscle weakness but no cramps, atrophy.  No use of non steroidal antiinflammatory drugs. Derm: Denies rash, itching, dry skin, hives, moles, warts, or unhealing ulcers.  Psych: Denies depression, anxiety, memory loss, suicidal ideation, hallucinations, paranoia, and confusion. Heme: Denies bruising, bleeding, and enlarged lymph nodes. Neuro: No headache.  No diplopia. No dysarthria.  No dysphasia.  No history of CVA.  No Seizures. No paresthesias.  No weakness. EndocrineDM.  No Thyroid disease.  No Adrenal disease.  Physical Exam: Vital signs in last 24 hours: Temp:  [98.8 F (37.1 C)] 98.8 F (37.1 C) (03/24 2349) Pulse Rate:  [63-80] 66 (03/25 1430) Resp:  [10-25] 14 (03/25 1430) BP: (147-233)/(73-122) 154/77 (03/25 1430) SpO2:  [96 %-100 %] 100 % (03/25  1430)   General:   Alert,  Well-developed, well-nourished, pleasant and cooperative in NAD Head:  Normocephalic and atraumatic. Eyes:  Sclera clear, no icterus.   Conjunctiva pink. Ears:  Normal auditory acuity. Nose:  No deformity, discharge,  or lesions. Mouth:  No deformity or lesions, dentition normal. Neck:  Supple; no masses or thyromegaly. JVP increased  Lungs:  Diminished with  increased rales Heart:  Regular rate and rhythm; no murmurs, clicks, rubs,  or gallops. Abdomen:  Soft, nontender and nondistended. No masses, hepatosplenomegaly or hernias noted. Normal bowel sounds, without guarding, and without rebound.   Msk:  Symmetrical without gross deformities. Normal posture. Pulses:  No carotid, renal, femoral bruits. DP and PT symmetrical and equal Extremities:  4 + edema Neurologic:  Alert and  oriented x4;  grossly normal neurologically. Skin:  Intact without significant lesions or rashes.   Intake/Output from previous day: 03/24 0701 - 03/25 0700 In: -  Out: 225 [Urine:225] Intake/Output this shift: Total I/O In: -  Out: 600 [Urine:600]  Lab Results: Recent Labs    07/06/17 0002  WBC 9.1  HGB 8.5*  HCT 26.7*  PLT 290   BMET Recent Labs    07/06/17 0002  NA 140  K 4.1  CL 104  CO2 22  GLUCOSE 176*  BUN 69*  CREATININE 8.86*  CALCIUM 8.4*   LFT No results for input(s): PROT, ALBUMIN, AST, ALT, ALKPHOS, BILITOT, BILIDIR, IBILI in the last 72 hours. PT/INR No results for input(s): LABPROT, INR in the last 72 hours. Hepatitis Panel No results for input(s): HEPBSAG, HCVAB, HEPAIGM, HEPBIGM in the last 72 hours.  Studies/Results: Dg Chest 2 View  Result Date: 07/06/2017 CLINICAL DATA:  Shortness of breath EXAM: CHEST - 2 VIEW COMPARISON:  None. FINDINGS: Remote median sternotomy for CABG. Cardiomegaly. Small pleural effusions, right greater than left. Moderate alveolar edema. IMPRESSION: Cardiomegaly and moderate alveolar pulmonary edema. Small pleural effusions. Findings suggest congestive heart failure. Electronically Signed   By: Ulyses Jarred M.D.   On: 07/06/2017 00:43   US Renal  Result Date: 07/06/2017 CLINICAL DATA:  42 year old male with AKI. History of diabetes and hypertension. EXAM: RENAL / URINARY TRACT ULTRASOUND COMPLETE COMPARISON:  None. FINDINGS: Right Kidney: Length: 12 cm. Increased echogenicity. No hydronephrosis or  shadowing stone. Left Kidney: Length: 12 cm. Increased echogenicity. No hydronephrosis or shadowing stone. Bladder: Appears normal for degree of bladder distention. IMPRESSION: Increased renal echogenicity likely related to medical renal disease. Clinical correlation is recommended. No hydronephrosis or shadowing stone. Electronically Signed   By: Anner Crete M.D.   On: 07/06/2017 04:10    Assessment/Plan:  1.Progressive renal insufficiency  Stage 5  Will need to start dialysis will need access placed  Diabetic nephropathy 2. Hypertension  NTG and diuresis with IV lasix for now 3. Anemia check Iron stores 4. Access  access placement   Consult vascular  Vein studies     LOS: 0 Jyl [place] W '@TODAY''@3'$ :16 PM

## 2017-07-06 NOTE — ED Notes (Signed)
Pt placed in hospital bed for comfort.

## 2017-07-06 NOTE — H&P (View-Only) (Signed)
[facility] [place]  Patient name: [name] MRN: [id number] DOB: 1975/06/30 Sex: male    HPI: [name] is a 42 y.o. male seen in consultation for access planning.  We have been requested to place a tunneled dialysis catheter tomorrow.  He presented to the emergency room with progressive volume overload.  Does have a history of known chronic renal insufficiency and is now on renal failure.  Has Alie Hardgrove onset coronary disease and is status post coronary bypass grafting.  Also is a diabetic and also has hypertension.  He is never had any access for hemodialysis and has no history of pacemaker placement.  He is right-handed.  Past Medical History:  Diagnosis Date  . CAD (coronary artery disease)   . Diabetes mellitus   . Hyperlipidemia   . Hypertension     Family History  Problem Relation Age of Onset  . Hypertension Mother   . Heart disease Father   . Hypertension Father   . Diabetes Father   . Hyperlipidemia Father     SOCIAL HISTORY: Social History   Tobacco Use  . Smoking status: Former Smoker    Last attempt to quit: 09/17/2008    Years since quitting: 8.8  . Smokeless tobacco: Never Used  Substance Use Topics  . Alcohol use: Yes    Comment: drink beer occas. (4-5 a week)    No Known Allergies  Current Facility-Administered Medications  Medication Dose Route Frequency Provider Last Rate Last Dose  . 0.9 %  sodium chloride infusion  250 mL Intravenous PRN Ivor Costa, MD      . acetaminophen (TYLENOL) tablet 650 mg  650 mg Oral Q4H PRN Ivor Costa, MD      . ALPRAZolam Duanne Moron) tablet 0.25 mg  0.25 mg Oral BID PRN Ivor Costa, MD      . amLODipine (NORVASC) tablet 10 mg  10 mg Oral Daily Ivor Costa, MD      . aspirin EC tablet 81 mg  81 mg Oral Daily Ivor Costa, MD   81 mg at 07/06/17 7673  . atorvastatin (LIPITOR) tablet 20 mg  20 mg Oral Q breakfast Ivor Costa, MD   20 mg at 07/06/17 4193  .  carvedilol (COREG) tablet 25 mg  25 mg Oral BID WC Ivor Costa, MD   25 mg at 07/06/17 0951  . cholecalciferol (VITAMIN D) tablet 1,000 Units  1,000 Units Oral Daily Ivor Costa, MD   1,000 Units at 07/06/17 [phone]  . cloNIDine (CATAPRES) tablet 0.2 mg  0.2 mg Oral TID Ivor Costa, MD   0.2 mg at 07/06/17 1526  . furosemide (LASIX) injection 80 mg  80 mg Intravenous BID Ivor Costa, MD   80 mg at 07/06/17 1013  . heparin injection 5,000 Units  5,000 Units Subcutaneous Q8H Ivor Costa, MD   5,000 Units at 07/06/17 1406  . hydrALAZINE (APRESOLINE) injection 5 mg  5 mg Intravenous Q2H PRN Ivor Costa, MD      . insulin aspart (novoLOG) injection 0-5 Units  0-5 Units Subcutaneous QHS Ivor Costa, MD      . insulin aspart (novoLOG) injection 0-9 Units  0-9 Units Subcutaneous TID WC Ivor Costa, MD   2 Units at 07/06/17 1251  . multivitamin with minerals tablet 1 tablet  1 tablet Oral Daily Ivor Costa, MD   1 tablet at 07/06/17 [phone]  . nitroGLYCERIN (NITROSTAT) SL tablet 0.4 mg  0.4 mg Sublingual Q5 min PRN Ivor Costa,  MD      . nitroGLYCERIN 50 mg in dextrose 5 % 250 mL (0.2 mg/mL) infusion  0-200 mcg/min Intravenous Titrated Ivor Costa, MD 51 mL/hr at 07/06/17 1527 170 mcg/min at 07/06/17 1527  . ondansetron (ZOFRAN) injection 4 mg  4 mg Intravenous Q6H PRN Ivor Costa, MD      . sodium chloride flush (NS) 0.9 % injection 3 mL  3 mL Intravenous Q12H Ivor Costa, MD   3 mL at 07/06/17 1017  . sodium chloride flush (NS) 0.9 % injection 3 mL  3 mL Intravenous PRN Ivor Costa, MD      . zolpidem (AMBIEN) tablet 5 mg  5 mg Oral QHS PRN,MR X 1 Ivor Costa, MD       Current Outpatient Medications  Medication Sig Dispense Refill  . atorvastatin (LIPITOR) 20 MG tablet TAKE ONE TABLET BY MOUTH ONCE DAILY AT BEDTIME 90 tablet 3  . carvedilol (COREG) 25 MG tablet TAKE 1 TABLET BY MOUTH TWICE DAILY 180 tablet 3  . cholecalciferol (VITAMIN D) 1000 UNITS tablet Take 1,000 Units by mouth daily.    . cloNIDine (CATAPRES) 0.1 MG  tablet Take 0.1 mg by mouth 2 (two) times daily.  1  . fish oil-omega-3 fatty acids 1000 MG capsule Take 2 g by mouth daily.    Marland Kitchen glimepiride (AMARYL) 4 MG tablet TAKE ONE TABLET BY MOUTH TWICE DAILY BEFORE MEAL(S) (Patient taking differently: TAKE 2 MG BY MOUTH ONCE DAILY) 60 tablet 5  . Multiple Vitamin (MULTIVITAMIN) tablet Take 1 tablet by mouth daily.    . nitroGLYCERIN (NITROSTAT) 0.4 MG SL tablet DISSOLVE ONE TABLET UNDER THE TONGUE EVERY 5 MINUTES AS NEEDED FOR CHEST PAIN.  DO NOT EXCEED A TOTAL OF 3 DOSES IN 15 MINUTES 25 tablet 3  . torsemide (DEMADEX) 20 MG tablet Take 40 mg by mouth 2 (two) times daily.  3  . blood glucose meter kit and supplies KIT Dispense based on patient and insurance preference. Use QD-BID home glucose monitoring. (FOR ICD-9 250.00, 250.01). 1 each 0    REVIEW OF SYSTEMS:  Reviewed in the history and physical with nothing to add  PHYSICAL EXAM: Vitals:   07/06/17 1400 07/06/17 1430 07/06/17 1526 07/06/17 1530  BP: (!) 154/73 (!) 154/77 (!) 185/90 (!) 178/81  Pulse: 66 66  67  Resp: '15 14  20  '$ Temp:      TempSrc:      SpO2: 100% 100%  100%    GENERAL: The patient is a well-nourished male, in no acute distress. The vital signs are documented above. CARDIOVASCULAR: 2+ radial pulses.  Poor  surface veins bilaterally. PULMONARY: There is good air exchange  MUSCULOSKELETAL: There are no major deformities or cyanosis. NEUROLOGIC: No focal weakness or paresthesias are detected. SKIN: There are no ulcers or rashes noted. PSYCHIATRIC: The patient has a normal affect.  DATA:  Vein map pending  MEDICAL ISSUES: Acute renal failure on top of chronic renal insufficiency.  I discussed need for hemodialysis catheter placement tomorrow.  Also discussed future options of AV fistula and AV grafts once he is stabilized on hemodialysis.  We will keep him n.p.o. after midnight and plan for catheter placement tomorrow    Rosetta Posner, MD [facility] Ve[facility]  [facility] Tel [phone] Pager [phone]

## 2017-07-06 NOTE — ED Notes (Signed)
Paged admitting MD to verify BP goal, maxed out on nitro. Per H & P SBP 160-180. BP 183/86 now.

## 2017-07-06 NOTE — H&P (Signed)
History and Physical    [name] FWY:[id number] DOB: 29-Dec-1975 DOA: 07/06/2017  Referring MD/NP/PA:   PCP: Larene Beach, MD   Patient coming from:  The patient is coming from home.  At baseline, pt is independent for most of ADL.   Chief Complaint: SOB and leg edema  HPI: [name] is a 42 y.o. male with medical history significant of CKD-V, hypertension, hyperlipidemia, diabetes mellitus, CAD, CABG, sCHF with EF 45%, obesity, who presents with shortness breath and leg edema.  Patient states that he has been having shortness of breath for more than 2 weeks, which has been progressively getting worse. He has dry cough, but no chest pain. No fever or chills. Patient states that he has worsening bilateral leg edema. He has gained more than 15 pounds recently. Patient states that he has stage V chronic kidney disease. He is followed up with Dr. Estill Cotta in [facility]. He was told that he almost reaches to the point of needing hemodialysis. No AVF was placed yet. He wants to get second opinion in [place]. Patient denies nausea, vomiting, diarrhea, abdominal pain, symptoms of UTI or unilateral weakness. He states that he is taking Lasix, possibly 80 mg twice a day. Patient has elevated Bp 233/97 in ED.  ED Course: pt was found to have negative troponin,WBC 9.1, potassium of 4.1, bicarbonate 22, creatinine 8.86 which was 5.98 on 04/28/17, BUN 69, temperature normal, no tachycardia, oxygen saturation to 86% on room air, chest x-ray showed pulmonary edema. Pt is admitted to stepdown as inpatient.  Review of Systems:   General: no fevers, chills, has body weight gain, has fatigue HEENT: no blurry vision, hearing changes or sore throat Respiratory: has dyspnea, coughing, no wheezing CV: no chest pain, no palpitations GI: no nausea, vomiting, abdominal pain, diarrhea, constipation GU: no dysuria, burning on urination, increased urinary frequency, hematuria  Ext: has leg edema Neuro: no  unilateral weakness, numbness, or tingling, no vision change or hearing loss Skin: no rash, no skin tear. MSK: No muscle spasm, no deformity, no limitation of range of movement in spin Heme: No easy bruising.  Travel history: No recent long distant travel.  Allergy: No Known Allergies  Past Medical History:  Diagnosis Date  . CAD (coronary artery disease)   . Diabetes mellitus   . Hyperlipidemia   . Hypertension     Past Surgical History:  Procedure Laterality Date  . CORONARY ANGIOPLASTY  2010   LIMA to LAD, SVG to OM1,SVG to PDA  . CORONARY ARTERY BYPASS GRAFT      Social History:  reports that he quit smoking about 8 years ago. He has never used smokeless tobacco. He reports that he drinks alcohol. He reports that he does not use drugs.  Family History:  Family History  Problem Relation Age of Onset  . Hypertension Mother   . Heart disease Father   . Hypertension Father   . Diabetes Father   . Hyperlipidemia Father      Prior to Admission medications   Medication Sig Start Date End Date Taking? Authorizing Provider  amLODipine (NORVASC) 10 MG tablet Take 10 mg by mouth. 11/03/16 02/01/17  [provider]  atorvastatin (LIPITOR) 20 MG tablet TAKE ONE TABLET BY MOUTH ONCE DAILY AT BEDTIME 09/17/16   Jeffery, [place], PA-C  blood glucose meter kit and supplies KIT Dispense based on patient and insurance preference. Use QD-BID home glucose monitoring. (FOR ICD-9 250.00, 250.01). 08/18/16   Harrison Mons, PA-C  carvedilol (  COREG) 25 MG tablet TAKE 1 TABLET BY MOUTH TWICE DAILY 10/13/16   Harrison Mons, PA-C  cholecalciferol (VITAMIN D) 1000 UNITS tablet Take 1,000 Units by mouth daily.    [provider]  fish oil-omega-3 fatty acids 1000 MG capsule Take 2 g by mouth daily.    [provider]  glimepiride (AMARYL) 4 MG tablet TAKE ONE TABLET BY MOUTH TWICE DAILY BEFORE MEAL(S) 09/29/16   Harrison Mons, PA-C  lisinopril-hydrochlorothiazide  (PRINZIDE,ZESTORETIC) 20-12.5 MG tablet TAKE 2 TABLETS BY MOUTH ONCE DAILY 09/05/16   Harrison Mons, PA-C  metFORMIN (GLUCOPHAGE) 1000 MG tablet TAKE ONE TABLET BY MOUTH TWICE DAILY WITH MEALS 09/29/16   Harrison Mons, PA-C  Multiple Vitamin (MULTIVITAMIN) tablet Take 1 tablet by mouth daily.    [provider]  nitroGLYCERIN (NITROSTAT) 0.4 MG SL tablet DISSOLVE ONE TABLET UNDER THE TONGUE EVERY 5 MINUTES AS NEEDED FOR CHEST PAIN.  DO NOT EXCEED A TOTAL OF 3 DOSES IN 15 MINUTES 08/06/16   Pixie Casino, MD    Physical Exam: Vitals:   07/06/17 0345 07/06/17 0400 07/06/17 0415 07/06/17 0430  BP: (!) 214/91 (!) 191/104 (!) 205/94 (!) 190/87  Pulse:  71 70 70  Resp:  '14 17 20  '$ Temp:      TempSrc:      SpO2:  99% 100% 100%   General: Not in acute distress. Anasarca. HEENT:       Eyes: PERRL, EOMI, no scleral icterus.       ENT: No discharge from the ears and nose, no pharynx injection, no tonsillar enlargement.        Neck: positive JVD, no bruit, no mass felt. Heme: No neck lymph node enlargement. Cardiac: S1/S2, RRR, No murmurs, No gallops or rubs. Respiratory: has rales bilaterally GI: Soft, nondistended, nontender, no rebound pain, no organomegaly, BS present. GU: No hematuria Ext: 3+ pitting leg edema bilaterally. 2+DP/PT pulse bilaterally. Musculoskeletal: No joint deformities, No joint redness or warmth, no limitation of ROM in spin. Skin: No rashes.  Neuro: Alert, oriented X3, cranial nerves II-XII grossly intact, moves all extremities normally Psych: Patient is not psychotic, no suicidal or hemocidal ideation.  Labs on Admission: I have personally reviewed following labs and imaging studies  CBC: Recent Labs  Lab 07/06/17 0002  WBC 9.1  HGB 8.5*  HCT 26.7*  MCV 82.4  PLT 790   Basic Metabolic Panel: Recent Labs  Lab 07/06/17 0002  NA 140  K 4.1  CL 104  CO2 22  GLUCOSE 176*  BUN 69*  CREATININE 8.86*  CALCIUM 8.4*   GFR: CrCl cannot be  calculated (Unknown ideal weight.). Liver Function Tests: No results for input(s): AST, ALT, ALKPHOS, BILITOT, PROT, ALBUMIN in the last 168 hours. No results for input(s): LIPASE, AMYLASE in the last 168 hours. No results for input(s): AMMONIA in the last 168 hours. Coagulation Profile: No results for input(s): INR, PROTIME in the last 168 hours. Cardiac Enzymes: Recent Labs  Lab 07/06/17 0236  TROPONINI 0.07*   BNP (last 3 results) No results for input(s): PROBNP in the last 8760 hours. HbA1C: Recent Labs    07/06/17 0300  HGBA1C 6.5*   CBG: No results for input(s): GLUCAP in the last 168 hours. Lipid Profile: Recent Labs    07/06/17 0300  CHOL 104  HDL 31*  LDLCALC 55  TRIG 88  CHOLHDL 3.4   Thyroid Function Tests: No results for input(s): TSH, T4TOTAL, FREET4, T3FREE, THYROIDAB in the last 72 hours. Anemia  Panel: Recent Labs    07/06/17 0108  VITAMINB12 1,006*  FOLATE 12.4  FERRITIN 87  TIBC 256  IRON 34*  RETICCTPCT 1.3   Urine analysis:    Component Value Date/Time   COLORURINE STRAW (A) 03/25/2015 0643   APPEARANCEUR CLEAR 03/25/2015 0643   LABSPEC 1.027 03/25/2015 0643   PHURINE 5.0 03/25/2015 0643   GLUCOSEU >1000 (A) 03/25/2015 0643   HGBUR SMALL (A) 03/25/2015 0643   BILIRUBINUR negative 08/18/2016 1702   KETONESUR negative 08/18/2016 1702   KETONESUR NEGATIVE 03/25/2015 0643   PROTEINUR >=300 (A) 08/18/2016 1702   PROTEINUR 100 (A) 03/25/2015 0643   UROBILINOGEN 0.2 08/18/2016 1702   UROBILINOGEN 1.0 12/23/2008 0539   NITRITE Negative 08/18/2016 1702   NITRITE NEGATIVE 03/25/2015 0643   LEUKOCYTESUR Negative 08/18/2016 1702   Sepsis Labs: '@LABRCNTIP'$ (procalcitonin:4,lacticidven:4) )No results found for this or any previous visit (from the past 240 hour(s)).   Radiological Exams on Admission: Dg Chest 2 View  Result Date: 07/06/2017 CLINICAL DATA:  Shortness of breath EXAM: CHEST - 2 VIEW COMPARISON:  None. FINDINGS: Remote median  sternotomy for CABG. Cardiomegaly. Small pleural effusions, right greater than left. Moderate alveolar edema. IMPRESSION: Cardiomegaly and moderate alveolar pulmonary edema. Small pleural effusions. Findings suggest congestive heart failure. Electronically Signed   By: Ulyses Jarred M.D.   On: 07/06/2017 00:43   US Renal  Result Date: 07/06/2017 CLINICAL DATA:  42 year old male with AKI. History of diabetes and hypertension. EXAM: RENAL / URINARY TRACT ULTRASOUND COMPLETE COMPARISON:  None. FINDINGS: Right Kidney: Length: 12 cm. Increased echogenicity. No hydronephrosis or shadowing stone. Left Kidney: Length: 12 cm. Increased echogenicity. No hydronephrosis or shadowing stone. Bladder: Appears normal for degree of bladder distention. IMPRESSION: Increased renal echogenicity likely related to medical renal disease. Clinical correlation is recommended. No hydronephrosis or shadowing stone. Electronically Signed   By: Anner Crete M.D.   On: 07/06/2017 04:10     EKG: Independently reviewed.  Sinus rhythm, QTC 473, RAD, PVC, LAE, poor R-wave progression.  Assessment/Plan Principal Problem:   Hypertensive emergency Active Problems:   Hypertension   HLD (hyperlipidemia)   CAD (coronary artery disease)   Type II diabetes mellitus with renal manifestations (HCC)   S/P CABG x 3   Acute on chronic systolic CHF (congestive heart failure) (HCC)   Anemia associated with chronic renal failure   Acute renal failure superimposed on stage 5 chronic kidney disease, not on chronic dialysis ([place])   Acute respiratory failure with hypoxia ([place])   Hypertensive emergency: blood pressure 263/97. No focal neurological findings on physical examination. -Will admit to SDU -Nitroglycerin drip for now, the of goal of bp reduction is by about 25 to 30% in first several hours, at SBP 160 to 180 mmHg. - Frequent neuro check   - continue home meds: amlodipine, clonidine, coreg  Acute respiratory failure with  hypoxia due to fluid overload, pulmonary edema and acute on chronic sCHF: 2-D echo on 12/16/08 showed EF of 45-50 percent. Patient has worsening leg edema, positive JVD, pulmonary edema on chest x-ray, consistent with CHF exacerbation and fluid overload. His worsening renal function has also contributed. Oxygen saturation is maintained above 93% with 3 L of cannula oxygen. -80 mg of lasix was given in ED, will give another 80 mg of lasix now, then Lasix 80 mg bid by IV -trop x 3 -2d echo -ASA and coreg -Daily weights -strict I/O's -Low salt diet  Acute renal failure superimposed on stage 5 chronic kidney disease, not  on chronic dialysis: Baseline Cre is  5.98 on 04/28/17, pt's Cre is 8.86 and BUN 69 on admission. The acute change is likely due to hypertensive emergency, but the patient has already reached end-stage renal disease, likely will need to start dialysis soon. - US-renal - Follow up renal function by BMP - please call renal in AM  HLD: -Lipitor  CAD (coronary artery disease): s/p of CBAG.  no CP -ASA, lipitor and coreg -f/u trop x 3 due to CHF and Hypertensive emergency  Type II diabetes mellitus with renal manifestations ([place]): Last A1c 7.5 on 08/18/16, not controled. Patient is taking albuterol and the metformin at home -SSI  Anemia associated with chronic renal failure: hgb 8.8 on 04/28/17--> 8.5 today. -Anemia panel   DVT ppx: SQ Lovenox Code Status: Full code Family Communication: Yes, patient's mother at bed side Disposition Plan:  Anticipate discharge back to previous home environment Consults called:  none Admission status:   SDU/inpation       Date of Service 07/06/2017    Ivor Costa [facility] Pager [phone]  If 7PM-7AM, please contact night-coverage [email or web address] Password [facility] 07/06/2017, 4:44 AM

## 2017-07-06 NOTE — Progress Notes (Signed)
PROGRESS NOTE    JAYLAN HINOJOSA  [id number]:[id number] DOB: Jan 12, 1976 DOA: 07/06/2017 PCP: Larene Beach, MD   Brief Narrative: DOMONIQUE BROUILLARD is a 42 y.o.  male with medical history significant of CKD-V, hypertension, hyperlipidemia, diabetes mellitus, CAD, CABG, sCHF with EF 45%, obesity. He presented with dyspnea and leg edema, found to have acute on chronic kidney injury and hypertensive emergency. Patient has been started on Lasix, home antihypertensives and nitro gtt.   Assessment & Plan:   Principal Problem:   Hypertensive emergency Active Problems:   Hypertension   HLD (hyperlipidemia)   CAD (coronary artery disease)   Type II diabetes mellitus with renal manifestations (HCC)   S/P CABG x 3   Acute on chronic systolic CHF (congestive heart failure) (HCC)   Anemia associated with chronic renal failure   Acute renal failure superimposed on stage 5 chronic kidney disease, not on chronic dialysis ([place])   Acute respiratory failure with hypoxia ([place])   Hypertensive emergency Improved from admission. Hypervolemia likely playing a role. Mild-moderate diuresis from lasix. -Continue home carvedilol and increased dose of clonidine 0.2 mg TID -Continue nitro drip  Acute kidney injury on CKD V Patient follows with nephrology at [facility], but wants to see someone locally. Fluid overloaded. Baseline is around 5-6 from last month. Still producing urine. He currently does not have HF access -Continue Lasix, but may need to increase dose -Nephrology consult (requesting vein mapping) -Strict in/out  Acute on chronic systolic heart failure History of reduced EF heart failure. Last EF of 45%. Likely contributing to overall presentation. -Echocardiogram -Lasix  Diabetes mellitus, type 2 On metformin as an outpatient. -Continue SSI  Anemia of chronic disease Secondary to renal failure -Per nephrology  Hyperlipidemia [place]  CAD S/p CABG. No chest pain. Troponin elevated but flat  trend. -Continue aspirin, Lipitor and Coreg   DVT prophylaxis: Heparin Code Status:   Code Status: Full Code Family Communication: Fiance and mother at bedside Disposition Plan: Pending nephrology workup and improvement of fluid status/heart failure/renal failure   Consultants:   Cardiology  Procedures:   None  Antimicrobials:  None    Subjective: Patient reports improvement in dyspnea.  Objective: Vitals:   07/06/17 1031 07/06/17 1130 07/06/17 1200 07/06/17 1230  BP: (!) 167/81 (!) 179/81 (!) 176/78 (!) 172/75  Pulse: 66 63 64 64  Resp: 14 13 14 13   Temp:      TempSrc:      SpO2: 100% 100% 100% 100%    Intake/Output Summary (Last 24 hours) at 07/06/2017 1310 Last data filed at 07/06/2017 1006 Gross per 24 hour  Intake -  Output 825 ml  Net -825 ml   There were no vitals filed for this visit.  Examination:  General exam: Appears calm and comfortable   Data Reviewed: I have personally reviewed following labs and imaging studies  CBC: Recent Labs  Lab 07/06/17 0002  WBC 9.1  HGB 8.5*  HCT 26.7*  MCV 82.4  PLT 270   Basic Metabolic Panel: Recent Labs  Lab 07/06/17 0002  NA 140  K 4.1  CL 104  CO2 22  GLUCOSE 176*  BUN 69*  CREATININE 8.86*  CALCIUM 8.4*   GFR: CrCl cannot be calculated (Unknown ideal weight.). Liver Function Tests: No results for input(s): AST, ALT, ALKPHOS, BILITOT, PROT, ALBUMIN in the last 168 hours. No results for input(s): LIPASE, AMYLASE in the last 168 hours. No results for input(s): AMMONIA in the last 168 hours. Coagulation Profile: No results  for input(s): INR, PROTIME in the last 168 hours. Cardiac Enzymes: Recent Labs  Lab 07/06/17 0236 07/06/17 0935  TROPONINI 0.07* 0.07*   BNP (last 3 results) No results for input(s): PROBNP in the last 8760 hours. HbA1C: Recent Labs    07/06/17 0300  HGBA1C 6.5*   CBG: Recent Labs  Lab 07/06/17 0749 07/06/17 1244  GLUCAP 178* 199*   Lipid  Profile: Recent Labs    07/06/17 0300  CHOL 104  HDL 31*  LDLCALC 55  TRIG 88  CHOLHDL 3.4   Thyroid Function Tests: No results for input(s): TSH, T4TOTAL, FREET4, T3FREE, THYROIDAB in the last 72 hours. Anemia Panel: Recent Labs    07/06/17 0108  VITAMINB12 1,006*  FOLATE 12.4  FERRITIN 87  TIBC 256  IRON 34*  RETICCTPCT 1.3   Sepsis Labs: No results for input(s): PROCALCITON, LATICACIDVEN in the last 168 hours.  No results found for this or any previous visit (from the past 240 hour(s)).       Radiology Studies: Dg Chest 2 View  Result Date: 07/06/2017 CLINICAL DATA:  Shortness of breath EXAM: CHEST - 2 VIEW COMPARISON:  None. FINDINGS: Remote median sternotomy for CABG. Cardiomegaly. Small pleural effusions, right greater than left. Moderate alveolar edema. IMPRESSION: Cardiomegaly and moderate alveolar pulmonary edema. Small pleural effusions. Findings suggest congestive heart failure. Electronically Signed   By: Ulyses Jarred M.D.   On: 07/06/2017 00:43   US Renal  Result Date: 07/06/2017 CLINICAL DATA:  42 year old male with AKI. History of diabetes and hypertension. EXAM: RENAL / URINARY TRACT ULTRASOUND COMPLETE COMPARISON:  None. FINDINGS: Right Kidney: Length: 12 cm. Increased echogenicity. No hydronephrosis or shadowing stone. Left Kidney: Length: 12 cm. Increased echogenicity. No hydronephrosis or shadowing stone. Bladder: Appears normal for degree of bladder distention. IMPRESSION: Increased renal echogenicity likely related to medical renal disease. Clinical correlation is recommended. No hydronephrosis or shadowing stone. Electronically Signed   By: Anner Crete M.D.   On: 07/06/2017 04:10        Scheduled Meds: . amLODipine  10 mg Oral Daily  . aspirin EC  81 mg Oral Daily  . atorvastatin  20 mg Oral Q breakfast  . carvedilol  25 mg Oral BID WC  . cholecalciferol  1,000 Units Oral Daily  . cloNIDine  0.2 mg Oral TID  . furosemide  80 mg  Intravenous BID  . heparin  5,000 Units Subcutaneous Q8H  . insulin aspart  0-5 Units Subcutaneous QHS  . insulin aspart  0-9 Units Subcutaneous TID WC  . multivitamin with minerals  1 tablet Oral Daily  . sodium chloride flush  3 mL Intravenous Q12H   Continuous Infusions: . sodium chloride    . nitroGLYCERIN 175 mcg/min (07/06/17 1251)     LOS: 0 days     Cordelia Poche, MD [facility] 07/06/2017, 1:10 PM Pager: [phone]  If 7PM-7AM, please contact night-coverage [email or web address] Password [email or web address] 07/06/2017, 1:10 PM

## 2017-07-06 NOTE — Plan of Care (Signed)
  Problem: Activity: Goal: Capacity to carry out activities will improve Outcome: Progressing   Problem: Clinical Measurements: Goal: Will remain free from infection Outcome: Progressing   Problem: Coping: Goal: Level of anxiety will decrease Outcome: Progressing   Problem: Nutrition: Goal: Adequate nutrition will be maintained Outcome: Completed/Met   Problem: Skin Integrity: Goal: Risk for impaired skin integrity will decrease Outcome: Completed/Met

## 2017-07-06 NOTE — Consult Note (Signed)
[facility] [place]  Patient name: MARX DOIG MRN: [id number] DOB: 1975/09/18 Sex: male    HPI: [name] is a 42 y.o. male seen in consultation for access planning.  We have been requested to place a tunneled dialysis catheter tomorrow.  He presented to the emergency room with progressive volume overload.  Does have a history of known chronic renal insufficiency and is now on renal failure.  Has Michille Mcelrath onset coronary disease and is status post coronary bypass grafting.  Also is a diabetic and also has hypertension.  He is never had any access for hemodialysis and has no history of pacemaker placement.  He is right-handed.  Past Medical History:  Diagnosis Date  . CAD (coronary artery disease)   . Diabetes mellitus   . Hyperlipidemia   . Hypertension     Family History  Problem Relation Age of Onset  . Hypertension Mother   . Heart disease Father   . Hypertension Father   . Diabetes Father   . Hyperlipidemia Father     SOCIAL HISTORY: Social History   Tobacco Use  . Smoking status: Former Smoker    Last attempt to quit: 09/17/2008    Years since quitting: 8.8  . Smokeless tobacco: Never Used  Substance Use Topics  . Alcohol use: Yes    Comment: drink beer occas. (4-5 a week)    No Known Allergies  Current Facility-Administered Medications  Medication Dose Route Frequency Provider Last Rate Last Dose  . 0.9 %  sodium chloride infusion  250 mL Intravenous PRN Ivor Costa, MD      . acetaminophen (TYLENOL) tablet 650 mg  650 mg Oral Q4H PRN Ivor Costa, MD      . ALPRAZolam Duanne Moron) tablet 0.25 mg  0.25 mg Oral BID PRN Ivor Costa, MD      . amLODipine (NORVASC) tablet 10 mg  10 mg Oral Daily Ivor Costa, MD      . aspirin EC tablet 81 mg  81 mg Oral Daily Ivor Costa, MD   81 mg at 07/06/17 2542  . atorvastatin (LIPITOR) tablet 20 mg  20 mg Oral Q breakfast Ivor Costa, MD   20 mg at 07/06/17 7062  .  carvedilol (COREG) tablet 25 mg  25 mg Oral BID WC Ivor Costa, MD   25 mg at 07/06/17 0951  . cholecalciferol (VITAMIN D) tablet 1,000 Units  1,000 Units Oral Daily Ivor Costa, MD   1,000 Units at 07/06/17 [phone]  . cloNIDine (CATAPRES) tablet 0.2 mg  0.2 mg Oral TID Ivor Costa, MD   0.2 mg at 07/06/17 1526  . furosemide (LASIX) injection 80 mg  80 mg Intravenous BID Ivor Costa, MD   80 mg at 07/06/17 1013  . heparin injection 5,000 Units  5,000 Units Subcutaneous Q8H Ivor Costa, MD   5,000 Units at 07/06/17 1406  . hydrALAZINE (APRESOLINE) injection 5 mg  5 mg Intravenous Q2H PRN Ivor Costa, MD      . insulin aspart (novoLOG) injection 0-5 Units  0-5 Units Subcutaneous QHS Ivor Costa, MD      . insulin aspart (novoLOG) injection 0-9 Units  0-9 Units Subcutaneous TID WC Ivor Costa, MD   2 Units at 07/06/17 1251  . multivitamin with minerals tablet 1 tablet  1 tablet Oral Daily Ivor Costa, MD   1 tablet at 07/06/17 [phone]  . nitroGLYCERIN (NITROSTAT) SL tablet 0.4 mg  0.4 mg Sublingual Q5 min PRN Ivor Costa,  MD      . nitroGLYCERIN 50 mg in dextrose 5 % 250 mL (0.2 mg/mL) infusion  0-200 mcg/min Intravenous Titrated Ivor Costa, MD 51 mL/hr at 07/06/17 1527 170 mcg/min at 07/06/17 1527  . ondansetron (ZOFRAN) injection 4 mg  4 mg Intravenous Q6H PRN Ivor Costa, MD      . sodium chloride flush (NS) 0.9 % injection 3 mL  3 mL Intravenous Q12H Ivor Costa, MD   3 mL at 07/06/17 1017  . sodium chloride flush (NS) 0.9 % injection 3 mL  3 mL Intravenous PRN Ivor Costa, MD      . zolpidem (AMBIEN) tablet 5 mg  5 mg Oral QHS PRN,MR X 1 Ivor Costa, MD       Current Outpatient Medications  Medication Sig Dispense Refill  . atorvastatin (LIPITOR) 20 MG tablet TAKE ONE TABLET BY MOUTH ONCE DAILY AT BEDTIME 90 tablet 3  . carvedilol (COREG) 25 MG tablet TAKE 1 TABLET BY MOUTH TWICE DAILY 180 tablet 3  . cholecalciferol (VITAMIN D) 1000 UNITS tablet Take 1,000 Units by mouth daily.    . cloNIDine (CATAPRES) 0.1 MG  tablet Take 0.1 mg by mouth 2 (two) times daily.  1  . fish oil-omega-3 fatty acids 1000 MG capsule Take 2 g by mouth daily.    Marland Kitchen glimepiride (AMARYL) 4 MG tablet TAKE ONE TABLET BY MOUTH TWICE DAILY BEFORE MEAL(S) (Patient taking differently: TAKE 2 MG BY MOUTH ONCE DAILY) 60 tablet 5  . Multiple Vitamin (MULTIVITAMIN) tablet Take 1 tablet by mouth daily.    . nitroGLYCERIN (NITROSTAT) 0.4 MG SL tablet DISSOLVE ONE TABLET UNDER THE TONGUE EVERY 5 MINUTES AS NEEDED FOR CHEST PAIN.  DO NOT EXCEED A TOTAL OF 3 DOSES IN 15 MINUTES 25 tablet 3  . torsemide (DEMADEX) 20 MG tablet Take 40 mg by mouth 2 (two) times daily.  3  . blood glucose meter kit and supplies KIT Dispense based on patient and insurance preference. Use QD-BID home glucose monitoring. (FOR ICD-9 250.00, 250.01). 1 each 0    REVIEW OF SYSTEMS:  Reviewed in the history and physical with nothing to add  PHYSICAL EXAM: Vitals:   07/06/17 1400 07/06/17 1430 07/06/17 1526 07/06/17 1530  BP: (!) 154/73 (!) 154/77 (!) 185/90 (!) 178/81  Pulse: 66 66  67  Resp: '15 14  20  '$ Temp:      TempSrc:      SpO2: 100% 100%  100%    GENERAL: The patient is a well-nourished male, in no acute distress. The vital signs are documented above. CARDIOVASCULAR: 2+ radial pulses.  Poor  surface veins bilaterally. PULMONARY: There is good air exchange  MUSCULOSKELETAL: There are no major deformities or cyanosis. NEUROLOGIC: No focal weakness or paresthesias are detected. SKIN: There are no ulcers or rashes noted. PSYCHIATRIC: The patient has a normal affect.  DATA:  Vein map pending  MEDICAL ISSUES: Acute renal failure on top of chronic renal insufficiency.  I discussed need for hemodialysis catheter placement tomorrow.  Also discussed future options of AV fistula and AV grafts once he is stabilized on hemodialysis.  We will keep him n.p.o. after midnight and plan for catheter placement tomorrow    Rosetta Posner, MD [facility]  [facility] Tel [phone] Pager [phone]

## 2017-07-06 NOTE — ED Notes (Signed)
Report from Annie, RN.

## 2017-07-06 NOTE — ED Notes (Signed)
Lunch ordered-Patient request:  No Chicken or Beef- (Fish/Salad/Vegetables-only)-per RN-called by Levada Dy

## 2017-07-07 ENCOUNTER — Inpatient Hospital Stay (HOSPITAL_COMMUNITY): Payer: BLUE CROSS/BLUE SHIELD

## 2017-07-07 ENCOUNTER — Inpatient Hospital Stay (HOSPITAL_COMMUNITY): Payer: BLUE CROSS/BLUE SHIELD | Admitting: Certified Registered"

## 2017-07-07 ENCOUNTER — Encounter (HOSPITAL_COMMUNITY)
Admission: EM | Disposition: A | Discharge status: Home / Self Care | Payer: Self-pay | Source: Home / Self Care | Attending: Family Medicine

## 2017-07-07 ENCOUNTER — Other Ambulatory Visit (HOSPITAL_COMMUNITY): Payer: BLUE CROSS/BLUE SHIELD

## 2017-07-07 DIAGNOSIS — J9601 Acute respiratory failure with hypoxia: Secondary | ICD-10-CM

## 2017-07-07 DIAGNOSIS — I10 Essential (primary) hypertension: Secondary | ICD-10-CM

## 2017-07-07 HISTORY — PX: INSERTION OF DIALYSIS CATHETER: SHX1324

## 2017-07-07 LAB — RETICULOCYTES
RBC.: 2.58 MIL/uL — ABNORMAL LOW (ref 4.22–5.81)
Retic Count, Absolute: 36.1 K/uL (ref 19.0–186.0)
Retic Ct Pct: 1.4 % (ref 0.4–3.1)

## 2017-07-07 LAB — BASIC METABOLIC PANEL WITH GFR
Anion gap: 10 (ref 5–15)
BUN: 69 mg/dL — ABNORMAL HIGH (ref 6–20)
CO2: 23 mmol/L (ref 22–32)
Calcium: 8.3 mg/dL — ABNORMAL LOW (ref 8.9–10.3)
Chloride: 106 mmol/L (ref 101–111)
Creatinine, Ser: 9.22 mg/dL — ABNORMAL HIGH (ref 0.61–1.24)
GFR calc Af Amer: 7 mL/min — ABNORMAL LOW
GFR calc non Af Amer: 6 mL/min — ABNORMAL LOW
Glucose, Bld: 184 mg/dL — ABNORMAL HIGH (ref 65–99)
Potassium: 3.9 mmol/L (ref 3.5–5.1)
Sodium: 139 mmol/L (ref 135–145)

## 2017-07-07 LAB — GLUCOSE, CAPILLARY
GLUCOSE-CAPILLARY: 156 mg/dL — AB (ref 65–99)
GLUCOSE-CAPILLARY: 150 mg/dL — AB (ref 65–99)
Glucose-Capillary: 170 mg/dL — ABNORMAL HIGH (ref 65–99)
Glucose-Capillary: 185 mg/dL — ABNORMAL HIGH (ref 65–99)
Glucose-Capillary: 174 mg/dL — ABNORMAL HIGH (ref 65–99)
Glucose-Capillary: 175 mg/dL — ABNORMAL HIGH (ref 65–99)
Glucose-Capillary: 221 mg/dL — ABNORMAL HIGH (ref 65–99)

## 2017-07-07 LAB — IRON AND TIBC
Iron: 18 ug/dL — ABNORMAL LOW (ref 45–182)
Saturation Ratios: 8 % — ABNORMAL LOW (ref 17.9–39.5)
TIBC: 235 ug/dL — ABNORMAL LOW (ref 250–450)
UIBC: 217 ug/dL

## 2017-07-07 LAB — SURGICAL PCR SCREEN
MRSA, PCR: NEGATIVE
Staphylococcus aureus: NEGATIVE

## 2017-07-07 LAB — FERRITIN: Ferritin: 79 ng/mL (ref 24–336)

## 2017-07-07 LAB — FOLATE: Folate: 10.1 ng/mL

## 2017-07-07 LAB — VITAMIN B12: Vitamin B-12: 941 pg/mL — ABNORMAL HIGH (ref 180–914)

## 2017-07-07 SURGERY — INSERTION OF DIALYSIS CATHETER
Anesthesia: Monitor Anesthesia Care | Site: Chest

## 2017-07-07 MED ORDER — PROPOFOL 500 MG/50ML IV EMUL
INTRAVENOUS | Status: DC | PRN
  Administered 2017-07-07: 25 ug/kg/min via INTRAVENOUS

## 2017-07-07 MED ORDER — ONDANSETRON HCL 4 MG/2ML IJ SOLN
INTRAMUSCULAR | Status: AC
  Filled 2017-07-07: qty 2

## 2017-07-07 MED ORDER — CLEVIDIPINE BUTYRATE 0.5 MG/ML IV EMUL
0.0000 mg/h | INTRAVENOUS | Status: DC
  Administered 2017-07-07: 2 mg/h via INTRAVENOUS
  Filled 2017-07-07: qty 50

## 2017-07-07 MED ORDER — MIDAZOLAM HCL 5 MG/5ML IJ SOLN
INTRAMUSCULAR | Status: DC | PRN
  Administered 2017-07-07 (×2): 1 mg via INTRAVENOUS

## 2017-07-07 MED ORDER — CEFAZOLIN SODIUM-DEXTROSE 2-3 GM-%(50ML) IV SOLR
INTRAVENOUS | Status: DC | PRN
  Administered 2017-07-07: 2 g via INTRAVENOUS

## 2017-07-07 MED ORDER — FUROSEMIDE 10 MG/ML IJ SOLN
80.0000 mg | Freq: Three times a day (TID) | INTRAMUSCULAR | Status: DC
  Administered 2017-07-07 – 2017-07-10 (×10): 80 mg via INTRAVENOUS
  Filled 2017-07-07 (×11): qty 8

## 2017-07-07 MED ORDER — ONDANSETRON HCL 4 MG/2ML IJ SOLN
INTRAMUSCULAR | Status: DC | PRN
  Administered 2017-07-07: 4 mg via INTRAVENOUS

## 2017-07-07 MED ORDER — HEPARIN SODIUM (PORCINE) 1000 UNIT/ML IJ SOLN
INTRAMUSCULAR | Status: DC | PRN
  Administered 2017-07-07: 3.4 [IU]

## 2017-07-07 MED ORDER — MIDAZOLAM HCL 2 MG/2ML IJ SOLN
INTRAMUSCULAR | Status: AC
  Filled 2017-07-07: qty 2

## 2017-07-07 MED ORDER — SODIUM CHLORIDE 0.9 % IV SOLN
INTRAVENOUS | Status: AC
  Filled 2017-07-07: qty 1.2

## 2017-07-07 MED ORDER — FUROSEMIDE 10 MG/ML IJ SOLN
120.0000 mg | Freq: Two times a day (BID) | INTRAMUSCULAR | Status: DC
  Filled 2017-07-07: qty 12

## 2017-07-07 MED ORDER — FENTANYL CITRATE (PF) 250 MCG/5ML IJ SOLN
INTRAMUSCULAR | Status: AC
  Filled 2017-07-07: qty 5

## 2017-07-07 MED ORDER — SODIUM CHLORIDE 0.9 % IV SOLN
125.0000 mg | INTRAVENOUS | Status: DC
  Administered 2017-07-08 – 2017-07-15 (×2): 125 mg via INTRAVENOUS
  Filled 2017-07-07 (×6): qty 10

## 2017-07-07 MED ORDER — SODIUM CHLORIDE 0.9 % IV SOLN
INTRAVENOUS | Status: DC
  Administered 2017-07-07: 16:00:00 via INTRAVENOUS

## 2017-07-07 MED ORDER — LIDOCAINE HCL (PF) 1 % IJ SOLN
INTRAMUSCULAR | Status: AC
  Filled 2017-07-07: qty 30

## 2017-07-07 MED ORDER — DARBEPOETIN ALFA 60 MCG/0.3ML IJ SOSY
60.0000 ug | PREFILLED_SYRINGE | INTRAMUSCULAR | Status: DC
  Administered 2017-07-08 – 2017-07-15 (×2): 60 ug via INTRAVENOUS
  Filled 2017-07-07 (×2): qty 0.3

## 2017-07-07 MED ORDER — 0.9 % SODIUM CHLORIDE (POUR BTL) OPTIME
TOPICAL | Status: DC | PRN
  Administered 2017-07-07: 1000 mL

## 2017-07-07 MED ORDER — HEPARIN SODIUM (PORCINE) 1000 UNIT/ML IJ SOLN
INTRAMUSCULAR | Status: AC
  Filled 2017-07-07: qty 1

## 2017-07-07 MED ORDER — CEFAZOLIN SODIUM 1 G IJ SOLR
INTRAMUSCULAR | Status: AC
  Filled 2017-07-07: qty 20

## 2017-07-07 MED ORDER — HEPARIN SODIUM (PORCINE) 5000 UNIT/ML IJ SOLN
INTRAMUSCULAR | Status: DC | PRN
  Administered 2017-07-07: 500 mL

## 2017-07-07 MED ORDER — LIDOCAINE HCL (PF) 1 % IJ SOLN
INTRAMUSCULAR | Status: DC | PRN
  Administered 2017-07-07: 20 mL

## 2017-07-07 MED ORDER — FENTANYL CITRATE (PF) 100 MCG/2ML IJ SOLN
INTRAMUSCULAR | Status: DC | PRN
  Administered 2017-07-07: 50 ug via INTRAVENOUS

## 2017-07-07 SURGICAL SUPPLY — 44 items
BAG DECANTER FOR FLEXI CONT (MISCELLANEOUS) ×3 IMPLANT
BIOPATCH RED 1 DISK 7.0 (GAUZE/BANDAGES/DRESSINGS) ×2 IMPLANT
BIOPATCH RED 1IN DISK 7.0MM (GAUZE/BANDAGES/DRESSINGS) ×1
CATH PALINDROME RT-P 15FX19CM (CATHETERS) IMPLANT
CATH PALINDROME RT-P 15FX23CM (CATHETERS) ×2 IMPLANT
CATH PALINDROME RT-P 15FX28CM (CATHETERS) IMPLANT
CATH PALINDROME RT-P 15FX55CM (CATHETERS) IMPLANT
CATH STRAIGHT 5FR 65CM (CATHETERS) IMPLANT
COVER PROBE W GEL 5X96 (DRAPES) ×3 IMPLANT
COVER SURGICAL LIGHT HANDLE (MISCELLANEOUS) ×3 IMPLANT
DECANTER SPIKE VIAL GLASS SM (MISCELLANEOUS) ×3 IMPLANT
DERMABOND ADVANCED (GAUZE/BANDAGES/DRESSINGS) ×2
DERMABOND ADVANCED .7 DNX12 (GAUZE/BANDAGES/DRESSINGS) ×1 IMPLANT
DRAPE C-ARM 42X72 X-RAY (DRAPES) ×3 IMPLANT
DRAPE CHEST BREAST 15X10 FENES (DRAPES) ×3 IMPLANT
DRSG COVADERM 4X6 (GAUZE/BANDAGES/DRESSINGS) ×2 IMPLANT
GAUZE SPONGE 4X4 16PLY XRAY LF (GAUZE/BANDAGES/DRESSINGS) ×3 IMPLANT
GLOVE BIO SURGEON STRL SZ7 (GLOVE) ×3 IMPLANT
GLOVE BIOGEL PI IND STRL 7.5 (GLOVE) ×1 IMPLANT
GLOVE BIOGEL PI INDICATOR 7.5 (GLOVE) ×2
GOWN STRL REUS W/ TWL LRG LVL3 (GOWN DISPOSABLE) ×2 IMPLANT
GOWN STRL REUS W/TWL LRG LVL3 (GOWN DISPOSABLE) ×4
KIT BASIN OR (CUSTOM PROCEDURE TRAY) ×3 IMPLANT
KIT ROOM TURNOVER OR (KITS) ×3 IMPLANT
NDL 18GX1X1/2 (RX/OR ONLY) (NEEDLE) ×1 IMPLANT
NDL HYPO 25GX1X1/2 BEV (NEEDLE) ×1 IMPLANT
NEEDLE 18GX1X1/2 (RX/OR ONLY) (NEEDLE) ×3 IMPLANT
NEEDLE HYPO 25GX1X1/2 BEV (NEEDLE) ×3 IMPLANT
NS IRRIG 1000ML POUR BTL (IV SOLUTION) ×3 IMPLANT
PACK SURGICAL SETUP 50X90 (CUSTOM PROCEDURE TRAY) ×3 IMPLANT
PAD ARMBOARD 7.5X6 YLW CONV (MISCELLANEOUS) ×6 IMPLANT
SET MICROPUNCTURE 5F STIFF (MISCELLANEOUS) IMPLANT
SOAP 2 % CHG 4 OZ (WOUND CARE) ×3 IMPLANT
SUT ETHILON 3 0 PS 1 (SUTURE) ×3 IMPLANT
SUT MNCRL AB 4-0 PS2 18 (SUTURE) ×3 IMPLANT
SYR 10ML LL (SYRINGE) ×3 IMPLANT
SYR 20CC LL (SYRINGE) ×6 IMPLANT
SYR 3ML LL SCALE MARK (SYRINGE) ×3 IMPLANT
SYR 5ML LL (SYRINGE) ×3 IMPLANT
SYR CONTROL 10ML LL (SYRINGE) ×3 IMPLANT
TOWEL GREEN STERILE (TOWEL DISPOSABLE) ×3 IMPLANT
TOWEL GREEN STERILE FF (TOWEL DISPOSABLE) ×3 IMPLANT
WATER STERILE IRR 1000ML POUR (IV SOLUTION) ×3 IMPLANT
WIRE AMPLATZ SS-J .035X180CM (WIRE) IMPLANT

## 2017-07-07 NOTE — Plan of Care (Signed)
  Problem: Education: Goal: Ability to demonstrate management of disease process will improve Outcome: Progressing   Problem: Activity: Goal: Capacity to carry out activities will improve Outcome: Progressing   Problem: Clinical Measurements: Goal: Will remain free from infection Outcome: Progressing   Problem: Activity: Goal: Risk for activity intolerance will decrease Outcome: Progressing   Problem: Coping: Goal: Level of anxiety will decrease Outcome: Progressing   Problem: Nutrition: Goal: Adequate nutrition will be maintained Outcome: Completed/Met   Problem: Pain Managment: Goal: General experience of comfort will improve Outcome: Completed/Met   Problem: Skin Integrity: Goal: Risk for impaired skin integrity will decrease Outcome: Completed/Met

## 2017-07-07 NOTE — Op Note (Signed)
**Note Kenneth-Identified via Obfuscation** OPERATIVE NOTE  PROCEDURE: 1.  Right internal jugular vein tunneled dialysis catheter placement 2.  Right internal jugular vein cannulation under ultrasound guidance  PRE-OPERATIVE DIAGNOSIS: end-stage renal failure  POST-OPERATIVE DIAGNOSIS: same as above  SURGEON: Kenneth Barthel, MD  ANESTHESIA: local and MAC  ESTIMATED BLOOD LOSS: 30 cc  FINDING(S): 1.  Tips of the catheter in the right atrium on fluoroscopy 2.  No obvious pneumothorax on fluoroscopy  SPECIMEN(S):  none  INDICATIONS:   [name] is a 42 y.o. male who presents with hemodialysis dependence.  The patient presents for tunneled dialysis catheter placement.  The patient is aware the risks of tunneled dialysis catheter placement include but are not limited to: bleeding, infection, central venous injury, pneumothorax, possible venous stenosis, possible malpositioning in the venous system, and possible infections related to long-term catheter presence.  The patient was aware of these risks and agreed to proceed.  DESCRIPTION: After written full informed consent was obtained from the patient, the patient was taken back to the operating room.  Prior to induction, the patient was given IV antibiotics.  After obtaining adequate sedation, the patient was prepped and draped in the standard fashion for a chest or neck tunneled dialysis catheter placement.     The cannulation site, the catheter exit site, and tract for the subcutaneous tunnel were then anesthestized with a total of 20 cc of 1% lidocaine without epinephrine.  Under ultrasound guidance, the right internal jugular vein was cannulated with the 18 gauge needle.  A J-wire was then placed down into the right ventricle under fluoroscopic guidance.  The wire was then secured in place with a clamp to the drapes.  The tapered wire guide was left occluding the lumen of the needle to avoid air embolism.    I then made stab incisions at the neck and exit sites.   I  dissected from the exit site to the cannulation site with a tunneler.   The wire was then unclamped and I removed the needle.  The skin tract and venotomy was dilated serially with graduated dilators, passed under fluoroscopic guidance.   Finally, the dilator-sheath was placed under fluoroscopic guidance into the superior vena cava.  The central dilator and wire were removed.  A 23 cm Palindrome catheter was placed under fluoroscopic guidance down into the right atrium.   The sheath was broken and peeled away while holding the catheter cuff at the level of the skin.  The catheter was clamped with the plastic clamp and the back end of this catheter was transected.  One lumen was docked onto the tunneler.  The catheter was delivered through the subcutaneous tunnel by pulling the tunneler out the exit site.  The catheter was reclamped and was transected a second time, revealing the two lumens of this catheter.  The catheter collar was loaded over the back end of the catheter.  The two  ports were docked onto these two lumens.  The catheter collar was then snapped into place, securing the two ports.  Each port was tested by aspirating and flushing.  No resistance was noted.  Each port was then thoroughly flushed with heparinized saline.  The catheter was secured in placed with two interrupted stitches of 3-0 Nylon tied to the catheter.  The neck incision was closed with a U-stitch of 4-0 Monocryl.  The neck and chest incision were cleaned.  The closed neck incision was reinforced with Dermabond and sterile bandages applied to the catheter exit site.  Each port was then loaded with concentrated heparin (1000 Units/mL) at the manufacturer recommended volumes to each port.  Sterile caps were applied to each port.    On completion fluoroscopy, the tips of the catheter were in the right atrium, and there was no evidence of pneumothorax.   COMPLICATIONS: none  CONDITION: guarded   Kenneth Barthel, MD, FACS [facility] [place] Office: [phone] Pager: [phone]  07/07/2017, 2:11 PM

## 2017-07-07 NOTE — Progress Notes (Signed)
PROGRESS NOTE    [name]  HYQ:[id number] DOB: 1975/09/03 DOA: 07/06/2017 PCP: Larene Beach, MD   Brief Narrative: [name] is a 42 y.o.  male with medical history significant of CKD-V, hypertension, hyperlipidemia, diabetes mellitus, CAD, CABG, sCHF with EF 45%, obesity. He presented with dyspnea and leg edema, found to have acute on chronic kidney injury and hypertensive emergency. Patient has been started on Lasix, home antihypertensives and nitro gtt.   Assessment & Plan:   Principal Problem:   Hypertensive emergency Active Problems:   Hypertension   HLD (hyperlipidemia)   CAD (coronary artery disease)   Type II diabetes mellitus with renal manifestations (HCC)   S/P CABG x 3   Acute on chronic systolic CHF (congestive heart failure) (HCC)   Anemia associated with chronic renal failure   Acute renal failure superimposed on stage 5 chronic kidney disease, not on chronic dialysis ([place])   Acute respiratory failure with hypoxia ([place])   Hypertensive emergency Improved from admission. Hypervolemia likely playing a role. Mild-moderate diuresis from lasix. -Continue home carvedilol and increased dose of clonidine 0.2 mg TID -Continue nitro drip  Acute kidney injury on CKD V Patient [facility], but wants to see someone locally. Fluid overloaded. Baseline is around 5-6 from last month. Still producing urine. He currently does not have HF access -Nephrology recommendations: Plan to start HD -Strict in/out  Acute on chronic systolic heart failure History of reduced EF heart failure. Last EF of 45%. Likely contributing to overall presentation. -Echocardiogram -Increase to Lasix 80 mg TID  Anasarca Secondary to renal failure/heart failure -will improve with HD  Diabetes mellitus, type 2 On metformin as an outpatient. -Continue SSI  Anemia Chronic disease vs iron deficiency. Anemia panel suggests some iron deficiency. -Per  nephrology  Hyperlipidemia Liptior  CAD S/p CABG. No chest pain. Troponin elevated but flat trend. -Continue aspirin, Lipitor and Coreg   DVT prophylaxis: Heparin Code Status:   Code Status: Full Code Family Communication: Fiance at bedside Disposition Plan: Pending nephrology workup and improvement of fluid status/heart failure/renal failure   Consultants:   Cardiology  Procedures:   None  Antimicrobials:  None    Subjective: No dyspnea today  Objective: Vitals:   07/06/17 2134 07/06/17 2143 07/07/17 0016 07/07/17 0346  BP:  (!) 209/91 (!) 180/79 (!) 179/83  Pulse:  77 84 78  Resp:  15 (!) 22 18  Temp:  98.8 F (37.1 C) 98.5 F (36.9 C) 99.1 F (37.3 C)  TempSrc:  Oral Oral Oral  SpO2:  100% 94% 99%  Weight: 127 kg (280 lb)  (!) 137 kg (302 lb 0.5 oz) (!) 137 kg (302 lb 0.5 oz)  Height: 5\' 7"  (1.702 m)       Intake/Output Summary (Last 24 hours) at 07/07/2017 1244 Last data filed at 07/07/2017 0830 Gross per 24 hour  Intake 594.67 ml  Output 970 ml  Net -375.33 ml   Filed Weights   07/06/17 2134 07/07/17 0016 07/07/17 0346  Weight: 127 kg (280 lb) (!) 137 kg (302 lb 0.5 oz) (!) 137 kg (302 lb 0.5 oz)    Examination:  General exam: Appears calm and comfortable Respiratory system: Clear to auscultation. Respiratory effort normal. Cardiovascular system: S1 & S2 heard, 2/6 systolic murmur Gastrointestinal system: Abdomen is obese, soft and nontender. No organomegaly or masses felt. Normal bowel sounds heard. Central nervous system: Alert and oriented. No focal neurological deficits. Extremities: Edema of arms and legs. Skin  taut. No calf tenderness. Skin: No cyanosis. No rashes Psychiatry: Judgement and insight appear normal. Mood & affect appropriate.     Data Reviewed: I have personally reviewed following labs and imaging studies  CBC: Recent Labs  Lab 07/06/17 0002  WBC 9.1  HGB 8.5*  HCT 26.7*  MCV 82.4  PLT 196   Basic Metabolic  Panel: Recent Labs  Lab 07/06/17 0002 07/07/17 0004  NA 140 139  K 4.1 3.9  CL 104 106  CO2 22 23  GLUCOSE 176* 184*  BUN 69* 69*  CREATININE 8.86* 9.22*  CALCIUM 8.4* 8.3*   GFR: Estimated Creatinine Clearance: 14 mL/min (A) (by C-G formula based on SCr of 9.22 mg/dL (H)). Liver Function Tests: No results for input(s): AST, ALT, ALKPHOS, BILITOT, PROT, ALBUMIN in the last 168 hours. No results for input(s): LIPASE, AMYLASE in the last 168 hours. No results for input(s): AMMONIA in the last 168 hours. Coagulation Profile: No results for input(s): INR, PROTIME in the last 168 hours. Cardiac Enzymes: Recent Labs  Lab 07/06/17 0236 07/06/17 0935  TROPONINI 0.07* 0.07*   BNP (last 3 results) No results for input(s): PROBNP in the last 8760 hours. HbA1C: Recent Labs    07/06/17 0300  HGBA1C 6.5*   CBG: Recent Labs  Lab 07/06/17 1244 07/06/17 2019 07/06/17 2148 07/07/17 0558 07/07/17 1125  GLUCAP 199* 180* 170* 185* 156*   Lipid Profile: Recent Labs    07/06/17 0300  CHOL 104  HDL 31*  LDLCALC 55  TRIG 88  CHOLHDL 3.4   Thyroid Function Tests: No results for input(s): TSH, T4TOTAL, FREET4, T3FREE, THYROIDAB in the last 72 hours. Anemia Panel: Recent Labs    07/06/17 0108 07/07/17 0004  VITAMINB12 1,006* 941*  FOLATE 12.4 10.1  FERRITIN 87 79  TIBC 256 235*  IRON 34* 18*  RETICCTPCT 1.3 1.4   Sepsis Labs: No results for input(s): PROCALCITON, LATICACIDVEN in the last 168 hours.  Recent Results (from the past 240 hour(s))  Surgical pcr screen     Status: None   Collection Time: 07/06/17 11:16 PM  Result Value Ref Range Status   MRSA, PCR NEGATIVE NEGATIVE Final   Staphylococcus aureus NEGATIVE NEGATIVE Final    Comment: (NOTE) The Xpert SA Assay (FDA approved for NASAL specimens in patients 22 years of age and older), is one component of a comprehensive surveillance program. It is not intended to diagnose infection nor to guide or monitor  treatment. Performed at [facility], [place] [address] [place], [place] [address]          Radiology Studies: Dg Chest 2 View  Result Date: 07/06/2017 CLINICAL DATA:  Shortness of breath EXAM: CHEST - 2 VIEW COMPARISON:  None. FINDINGS: Remote median sternotomy for CABG. Cardiomegaly. Small pleural effusions, right greater than left. Moderate alveolar edema. IMPRESSION: Cardiomegaly and moderate alveolar pulmonary edema. Small pleural effusions. Findings suggest congestive heart failure. Electronically Signed   By: Ulyses Jarred M.D.   On: 07/06/2017 00:43   US Renal  Result Date: 07/06/2017 CLINICAL DATA:  42 year old male with AKI. History of diabetes and hypertension. EXAM: RENAL / URINARY TRACT ULTRASOUND COMPLETE COMPARISON:  None. FINDINGS: Right Kidney: Length: 12 cm. Increased echogenicity. No hydronephrosis or shadowing stone. Left Kidney: Length: 12 cm. Increased echogenicity. No hydronephrosis or shadowing stone. Bladder: Appears normal for degree of bladder distention. IMPRESSION: Increased renal echogenicity likely related to medical renal disease. Clinical correlation is recommended. No hydronephrosis or shadowing stone. Electronically Signed  By: Anner Crete M.D.   On: 07/06/2017 04:10        Scheduled Meds: . [MAR Hold] amLODipine  10 mg Oral Daily  . [MAR Hold] aspirin EC  81 mg Oral Daily  . [MAR Hold] atorvastatin  20 mg Oral Q breakfast  . [MAR Hold] carvedilol  25 mg Oral BID WC  . [MAR Hold] cholecalciferol  1,000 Units Oral Daily  . [MAR Hold] cloNIDine  0.2 mg Oral TID  . [MAR Hold] darbepoetin (ARANESP) injection - DIALYSIS  60 mcg Intravenous Q Wed-HD  . [MAR Hold] furosemide  80 mg Intravenous TID  . [MAR Hold] heparin  5,000 Units Subcutaneous Q8H  . [MAR Hold] insulin aspart  0-5 Units Subcutaneous QHS  . [MAR Hold] insulin aspart  0-9 Units Subcutaneous TID WC  . [MAR Hold] multivitamin with minerals  1 tablet Oral Daily  . [MAR Hold]  sodium chloride flush  3 mL Intravenous Q12H   Continuous Infusions: . [MAR Hold] sodium chloride    . sodium chloride    . [MAR Hold] ferric gluconate (FERRLECIT/NULECIT) IV    . [MAR Hold] nitroGLYCERIN 191.667 mcg/min (07/07/17 0815)     LOS: 1 day     Cordelia Poche, MD [facility] 07/07/2017, 12:44 PM Pager: [phone]  If 7PM-7AM, please contact night-coverage [email or web address] Password [facility] 07/07/2017, 12:44 PM

## 2017-07-07 NOTE — Progress Notes (Signed)
[facility] ROUNDING NOTE   Subjective:   Interval History:42 y.o.malewith medical history significant ofCKD-V, hypertension, hyperlipidemia, diabetes mellitus, CAD, CABG, sCHF with EF 45%, obesity. He presented with dyspnea and leg edema, found to have acute on chronic kidney injury and hypertensive emergency. He is will to start dialysis treatment and is scheduled for catheter placement    Objective:  Vital signs in last 24 hours:  Temp:  [98.5 F (36.9 C)-99.1 F (37.3 C)] 99.1 F (37.3 C) (03/26 0346) Pulse Rate:  [63-84] 78 (03/26 0346) Resp:  [12-25] 18 (03/26 0346) BP: (154-209)/(73-92) 179/83 (03/26 0346) SpO2:  [94 %-100 %] 99 % (03/26 0346) Weight:  [280 lb (127 kg)-302 lb 0.5 oz (137 kg)] 302 lb 0.5 oz (137 kg) (03/26 0346)  Weight change:  Filed Weights   07/06/17 2134 07/07/17 0016 07/07/17 0346  Weight: 280 lb (127 kg) (!) 302 lb 0.5 oz (137 kg) (!) 302 lb 0.5 oz (137 kg)    Intake/Output: I/O last 3 completed shifts: In: 594.7 [I.V.:594.7] Out: 1395 [Urine:1395]   Intake/Output this shift:  Total I/O In: 0  Out: 400 [Urine:400]  CVS- RRR RS- CTA ABD- BS present soft non-distended EXT- 4 +  edema   Basic Metabolic Panel: Recent Labs  Lab 07/06/17 0002 07/07/17 0004  NA 140 139  K 4.1 3.9  CL 104 106  CO2 22 23  GLUCOSE 176* 184*  BUN 69* 69*  CREATININE 8.86* 9.22*  CALCIUM 8.4* 8.3*    Liver Function Tests: No results for input(s): AST, ALT, ALKPHOS, BILITOT, PROT, ALBUMIN in the last 168 hours. No results for input(s): LIPASE, AMYLASE in the last 168 hours. No results for input(s): AMMONIA in the last 168 hours.  CBC: Recent Labs  Lab 07/06/17 0002  WBC 9.1  HGB 8.5*  HCT 26.7*  MCV 82.4  PLT 290    Cardiac Enzymes: Recent Labs  Lab 07/06/17 0236 07/06/17 0935  TROPONINI 0.07* 0.07*    BNP: Invalid input(s): POCBNP  CBG: Recent Labs  Lab 07/06/17 0749 07/06/17 1244 07/06/17 2019  07/06/17 2148 07/07/17 0558  GLUCAP 178* 199* 180* 170* 185*    Microbiology: Results for orders placed or performed during the hospital encounter of 07/06/17  Surgical pcr screen     Status: None   Collection Time: 07/06/17 11:16 PM  Result Value Ref Range Status   MRSA, PCR NEGATIVE NEGATIVE Final   Staphylococcus aureus NEGATIVE NEGATIVE Final    Comment: (NOTE) The Xpert SA Assay (FDA approved for NASAL specimens in patients 28 years of age and older), is one component of a comprehensive surveillance program. It is not intended to diagnose infection nor to guide or monitor treatment. Performed at [facility], [employer] [address], [place], [place] [address]     Coagulation Studies: No results for input(s): LABPROT, INR in the last 72 hours.  Urinalysis: Recent Labs    07/06/17 1006  [place] 1.010  PHURINE 5.0  GLUCOSEU 50*  HGBUR NEGATIVE  BILIRUBINUR NEGATIVE  KETONESUR NEGATIVE  PROTEINUR >=300*  NITRITE NEGATIVE  LEUKOCYTESUR NEGATIVE      Imaging: Dg Chest 2 View  Result Date: 07/06/2017 CLINICAL DATA:  Shortness of breath EXAM: CHEST - 2 VIEW COMPARISON:  None. FINDINGS: Remote median sternotomy for CABG. Cardiomegaly. Small pleural effusions, right greater than left. Moderate alveolar edema. IMPRESSION: Cardiomegaly and moderate alveolar pulmonary edema. Small pleural effusions. Findings suggest congestive heart failure. Electronically Signed   By: Lennette Bihari  [place] M.D.   On: 07/06/2017 00:43   US Renal  Result Date: 07/06/2017 CLINICAL DATA:  42 year old male with AKI. History of diabetes and hypertension. EXAM: RENAL / URINARY TRACT ULTRASOUND COMPLETE COMPARISON:  None. FINDINGS: Right Kidney: Length: 12 cm. Increased echogenicity. No hydronephrosis or shadowing stone. Left Kidney: Length: 12 cm. Increased echogenicity. No hydronephrosis or shadowing stone. Bladder: Appears normal for degree of bladder distention. IMPRESSION: Increased  renal echogenicity likely related to medical renal disease. Clinical correlation is recommended. No hydronephrosis or shadowing stone. Electronically Signed   By: Anner Crete M.D.   On: 07/06/2017 04:10     Medications:   . sodium chloride    . nitroGLYCERIN 191.667 mcg/min (07/07/17 0815)   . amLODipine  10 mg Oral Daily  . aspirin EC  81 mg Oral Daily  . atorvastatin  20 mg Oral Q breakfast  . carvedilol  25 mg Oral BID WC  . cholecalciferol  1,000 Units Oral Daily  . cloNIDine  0.2 mg Oral TID  . furosemide  80 mg Intravenous TID  . heparin  5,000 Units Subcutaneous Q8H  . insulin aspart  0-5 Units Subcutaneous QHS  . insulin aspart  0-9 Units Subcutaneous TID WC  . multivitamin with minerals  1 tablet Oral Daily  . sodium chloride flush  3 mL Intravenous Q12H   sodium chloride, acetaminophen, ALPRAZolam, hydrALAZINE, nitroGLYCERIN, ondansetron (ZOFRAN) IV, sodium chloride flush, zolpidem  Assessment/ Plan:  1.Progressive renal insufficiency  Stage 5  Will need to start dialysis will need access placed  Diabetic nephropathy  CLIP process 2. Hypertension  NTG and diuresis with IV lasix for now 3. Anemia  Tsats low   Start iron and esa 4. Access  access placement   appreciate Dr Early     LOS: 1 [name] W @TODAY @10 :59 AM

## 2017-07-07 NOTE — Interval H&P Note (Signed)
   History and Physical Update  The patient was interviewed and re-examined.  The patient's previous History and Physical has been reviewed and is unchanged from Dr. Luther Parody consult.  There is no change in the plan of care: tunneled dialysis catheter placement.   The patient is aware the risks of tunneled dialysis catheter placement include but are not limited to: bleeding, infection, central venous injury, pneumothorax, possible venous stenosis, possible malpositioning in the venous system, and possible infections related to long-term catheter presence.   The patient was aware of these risks and agreed to proceed.   Adele Barthel, MD, FACS [facility] Office: [phone] Pager: [phone]  07/07/2017, 1:10 PM

## 2017-07-07 NOTE — Anesthesia Postprocedure Evaluation (Signed)
Anesthesia Post Note  Patient: [name]  Procedure(s) Performed: INSERTION OF DIALYSIS CATHETER (N/A Chest)     Patient location during evaluation: PACU Anesthesia Type: MAC Level of consciousness: awake and alert Pain management: pain level controlled Vital Signs Assessment: post-procedure vital signs reviewed and stable Respiratory status: spontaneous breathing, nonlabored ventilation, respiratory function stable and patient connected to nasal cannula oxygen Cardiovascular status: stable and blood pressure returned to baseline Postop Assessment: no apparent nausea or vomiting Anesthetic complications: no    Last Vitals:  Vitals:   07/07/17 1517 07/07/17 1522  BP: (!) 127/57 (!) 132/58  Pulse: 69 69  Resp: 14 14  Temp:    SpO2: 97% 97%    Last Pain:  Vitals:   07/07/17 1525  TempSrc:   PainSc: 0-No pain                 Barnet Glasgow

## 2017-07-07 NOTE — Transfer of Care (Signed)
Immediate Anesthesia Transfer of Care Note  Patient: [name]  Procedure(s) Performed: INSERTION OF DIALYSIS CATHETER (N/A Chest)  Patient Location: PACU  Anesthesia Type:MAC  Level of Consciousness: awake, alert , oriented and patient cooperative  Airway & Oxygen Therapy: Patient Spontanous Breathing and Patient connected to nasal cannula oxygen  Post-op Assessment: Report given to RN and Post -op Vital signs reviewed and stable  Post vital signs: Reviewed and stable  Last Vitals:  Vitals Value Taken Time  BP 135/62 07/07/2017  2:23 PM  Temp    Pulse 70 07/07/2017  2:24 PM  Resp 5 07/07/2017  2:24 PM  SpO2 97 % 07/07/2017  2:24 PM  Vitals shown include unvalidated device data.  Last Pain:  Vitals:   07/07/17 0820  TempSrc:   PainSc: 0-No pain         Complications: No apparent anesthesia complications

## 2017-07-07 NOTE — Anesthesia Preprocedure Evaluation (Signed)
Anesthesia Evaluation  Patient identified by MRN, date of birth, ID band Patient awake    Reviewed: Allergy & Precautions, NPO status , Patient's Chart, lab work & pertinent test results  Airway Mallampati: III       Dental no notable dental hx.    Pulmonary former smoker,    Pulmonary exam normal        Cardiovascular hypertension, + CAD  Normal cardiovascular exam  Echo- EF 45%   Neuro/Psych    GI/Hepatic   Endo/Other  diabetesMorbid obesity  Renal/GU Renal disease     Musculoskeletal   Abdominal   Peds  Hematology  (+) anemia ,   Anesthesia Other Findings   Reproductive/Obstetrics                             Lab Results  Component Value Date   WBC 9.1 07/06/2017   HGB 8.5 (L) 07/06/2017   HCT 26.7 (L) 07/06/2017   MCV 82.4 07/06/2017   PLT 290 07/06/2017    Anesthesia Physical Anesthesia Plan  ASA: IV  Anesthesia Plan: MAC   Post-op Pain Management:    Induction: Intravenous  PONV Risk Score and Plan:   Airway Management Planned: Mask and Natural Airway  Additional Equipment:   Intra-op Plan:   Post-operative Plan:   Informed Consent: I have reviewed the patients History and Physical, chart, labs and discussed the procedure including the risks, benefits and alternatives for the proposed anesthesia with the patient or authorized representative who has indicated his/her understanding and acceptance.     Plan Discussed with: CRNA  Anesthesia Plan Comments:         Anesthesia Quick Evaluation

## 2017-07-07 NOTE — Anesthesia Procedure Notes (Signed)
Procedure Name: MAC Date/Time: 07/07/2017 1:47 PM Performed by: Harden Mo, CRNA Pre-anesthesia Checklist: Patient identified, Emergency Drugs available, Suction available and Patient being monitored Patient Re-evaluated:Patient Re-evaluated prior to induction Oxygen Delivery Method: Simple face mask Preoxygenation: Pre-oxygenation with 100% oxygen Induction Type: IV induction Placement Confirmation: positive ETCO2,  CO2 detector and breath sounds checked- equal and bilateral Dental Injury: Teeth and Oropharynx as per pre-operative assessment

## 2017-07-07 NOTE — Progress Notes (Signed)
Patient received back from PACU, A&Ox3, VSS.

## 2017-07-07 NOTE — Progress Notes (Addendum)
Unable to perform echo today due to the patient being in short stay and the OR for the majority of the day. We will try again tomorrow.

## 2017-07-08 ENCOUNTER — Inpatient Hospital Stay (HOSPITAL_COMMUNITY): Payer: BLUE CROSS/BLUE SHIELD

## 2017-07-08 ENCOUNTER — Encounter (HOSPITAL_COMMUNITY): Payer: Self-pay | Admitting: Vascular Surgery

## 2017-07-08 DIAGNOSIS — I5023 Acute on chronic systolic (congestive) heart failure: Secondary | ICD-10-CM

## 2017-07-08 DIAGNOSIS — I161 Hypertensive emergency: Secondary | ICD-10-CM

## 2017-07-08 DIAGNOSIS — E1122 Type 2 diabetes mellitus with diabetic chronic kidney disease: Secondary | ICD-10-CM

## 2017-07-08 DIAGNOSIS — N185 Chronic kidney disease, stage 5: Secondary | ICD-10-CM

## 2017-07-08 DIAGNOSIS — I2510 Atherosclerotic heart disease of native coronary artery without angina pectoris: Secondary | ICD-10-CM

## 2017-07-08 DIAGNOSIS — I2583 Coronary atherosclerosis due to lipid rich plaque: Secondary | ICD-10-CM

## 2017-07-08 LAB — PREPARE RBC (CROSSMATCH)

## 2017-07-08 LAB — RENAL FUNCTION PANEL
Albumin: 2.2 g/dL — ABNORMAL LOW (ref 3.5–5.0)
Anion gap: 10 (ref 5–15)
BUN: 73 mg/dL — ABNORMAL HIGH (ref 6–20)
CALCIUM: 7.9 mg/dL — AB (ref 8.9–10.3)
CHLORIDE: 104 mmol/L (ref 101–111)
CO2: 22 mmol/L (ref 22–32)
Creatinine, Ser: 9.09 mg/dL — ABNORMAL HIGH (ref 0.61–1.24)
GFR calc non Af Amer: 6 mL/min — ABNORMAL LOW (ref >60)
GFR, EST AFRICAN AMERICAN: 7 mL/min — AB (ref >60)
Glucose, Bld: 235 mg/dL — ABNORMAL HIGH (ref 65–99)
POTASSIUM: 3.9 mmol/L (ref 3.5–5.1)
Phosphorus: 6.6 mg/dL — ABNORMAL HIGH (ref 2.5–4.6)
SODIUM: 136 mmol/L (ref 135–145)

## 2017-07-08 LAB — GLUCOSE, CAPILLARY
GLUCOSE-CAPILLARY: 210 mg/dL — AB (ref 65–99)
Glucose-Capillary: 170 mg/dL — ABNORMAL HIGH (ref 65–99)
Glucose-Capillary: 232 mg/dL — ABNORMAL HIGH (ref 65–99)
Glucose-Capillary: 202 mg/dL — ABNORMAL HIGH (ref 65–99)

## 2017-07-08 LAB — CBC
HCT: 20 % — ABNORMAL LOW (ref 39.0–52.0)
HEMOGLOBIN: 6.4 g/dL — AB (ref 13.0–17.0)
MCH: 26.8 pg (ref 26.0–34.0)
MCHC: 32 g/dL (ref 30.0–36.0)
MCV: 83.7 fL (ref 78.0–100.0)
Platelets: 218 10*3/uL (ref 150–400)
RBC: 2.39 MIL/uL — ABNORMAL LOW (ref 4.22–5.81)
RDW: 15.6 % — ABNORMAL HIGH (ref 11.5–15.5)
WBC: 10.5 10*3/uL (ref 4.0–10.5)

## 2017-07-08 LAB — HEMOGLOBIN AND HEMATOCRIT, BLOOD
HEMATOCRIT: 20.9 % — AB (ref 39.0–52.0)
Hemoglobin: 6.7 g/dL — CL (ref 13.0–17.0)

## 2017-07-08 LAB — PARATHYROID HORMONE, INTACT (NO CA): PTH: 204 pg/mL — ABNORMAL HIGH (ref 15–65)

## 2017-07-08 LAB — HEPATITIS B SURFACE ANTIGEN: Hepatitis B Surface Ag: NEGATIVE

## 2017-07-08 LAB — ECHOCARDIOGRAM COMPLETE
Height: 67 in
WEIGHTICAEL: 4532.8 [oz_av]

## 2017-07-08 MED ORDER — HEPARIN SODIUM (PORCINE) 1000 UNIT/ML DIALYSIS: 2500.0000 [IU] | Freq: Once | INTRAMUSCULAR | Status: DC

## 2017-07-08 MED ORDER — LIDOCAINE HCL (PF) 1 % IJ SOLN: 5.0000 mL | INTRAMUSCULAR | Status: DC | PRN

## 2017-07-08 MED ORDER — PENTAFLUOROPROP-TETRAFLUOROETH EX AERO: 1.0000 "application " | INHALATION_SPRAY | CUTANEOUS | Status: DC | PRN

## 2017-07-08 MED ORDER — DARBEPOETIN ALFA 60 MCG/0.3ML IJ SOSY
PREFILLED_SYRINGE | INTRAMUSCULAR | Status: AC
  Administered 2017-07-08: 60 ug via INTRAVENOUS
  Filled 2017-07-08: qty 0.3

## 2017-07-08 MED ORDER — LIDOCAINE-PRILOCAINE 2.5-2.5 % EX CREA: 1.0000 "application " | TOPICAL_CREAM | CUTANEOUS | Status: DC | PRN

## 2017-07-08 MED ORDER — ALTEPLASE 2 MG IJ SOLR: 2.0000 mg | Freq: Once | INTRAMUSCULAR | Status: DC | PRN

## 2017-07-08 MED ORDER — SODIUM CHLORIDE 0.9 % IV SOLN: 100.0000 mL | INTRAVENOUS | Status: DC | PRN

## 2017-07-08 MED ORDER — SODIUM CHLORIDE 0.9 % IV SOLN: Freq: Once | INTRAVENOUS | Status: DC

## 2017-07-08 MED ORDER — HEPARIN SODIUM (PORCINE) 1000 UNIT/ML DIALYSIS: 1000.0000 [IU] | INTRAMUSCULAR | Status: DC | PRN

## 2017-07-08 NOTE — Progress Notes (Signed)
  Echocardiogram 2D Echocardiogram has been performed.  [name] 07/08/2017, 3:03 PM

## 2017-07-08 NOTE — Progress Notes (Signed)
  [facility] Progress Note   Interval History:41 y.o.malewith medical history significant ofCKD-V, hypertension, hyperlipidemia, diabetes mellitus, CAD, CABG, sCHF with EF 45%, obesity. He presented with dyspnea and leg edema, found to have acute on chronic kidney injury and hypertensive emergency.   Assessment/ Plan:   1.Progressive renal insufficiency Stage 5 Will need to start dialysis will need access placed Diabetic nephropathy   - CLIP process - HD #1 today; plan for 3 straight dialysis days and then will put on regular thrice weekly regimen. 2. Hypertension NTG and diuresis with IV lasix for now 3. Anemia  Tsats low   Start iron and esa 4. Access access placement  appreciate Dr Bridgett Larsson placing the TC RIJ.    Subjective:   Unstable after MAC for the catheter yest but feels more stable today walking to the restroom. Denies cough, f/c/n/v.   Objective:   BP (!) 176/75   Pulse 62   Temp 98.3 F (36.8 C) (Oral)   Resp 18   Ht _0  (1.702 m)   Wt 128.5 kg (283 lb 4.8 oz)   SpO2 98%   BMI 44.37 kg/m   Intake/Output Summary (Last 24 hours) at 07/08/2017 1137 Last data filed at 07/08/2017 0700 Gross per 24 hour  Intake 1455.33 ml  Output 875 ml  Net 580.33 ml   Weight change: 1.497 kg (3 lb 4.8 oz)  Physical Exam: NAD A&Ox3 Access: RIJ TC CVS- RRR RS- CTA ABD- BS present soft non-distended EXT- 4 +  edema    Imaging: Dg Chest Port 1 View  Result Date: 07/07/2017 CLINICAL DATA:  Followup catheter placement. EXAM: PORTABLE CHEST 1 VIEW COMPARISON:  07/06/2017 FINDINGS: Right internal jugular catheter has been placed. The tip is at the SVC RA junction. No pneumothorax. Venous hypertension and interstitial edema persist with small effusions, right more than left. IMPRESSION: Catheter tip at the SVC RA junction.  No complication. Electronically Signed   By: Nelson Chimes M.D.   On: 07/07/2017 14:39   Dg Fluoro Guide Cv Line-no Report  Result  Date: 07/07/2017 Fluoroscopy was utilized by the requesting physician.  No radiographic interpretation.    Labs: BMET Recent Labs  Lab 07/06/17 0002 07/07/17 0004  NA 140 139  K 4.1 3.9  CL 104 106  CO2 22 23  GLUCOSE 176* 184*  BUN 69* 69*  CREATININE 8.86* 9.22*  CALCIUM 8.4* 8.3*   CBC Recent Labs  Lab 07/06/17 0002  WBC 9.1  HGB 8.5*  HCT 26.7*  MCV 82.4  PLT 290    Medications:    . amLODipine  10 mg Oral Daily  . aspirin EC  81 mg Oral Daily  . atorvastatin  20 mg Oral Q breakfast  . carvedilol  25 mg Oral BID WC  . cholecalciferol  1,000 Units Oral Daily  . cloNIDine  0.2 mg Oral TID  . darbepoetin (ARANESP) injection - DIALYSIS  60 mcg Intravenous Q Wed-HD  . furosemide  80 mg Intravenous TID  . heparin  5,000 Units Subcutaneous Q8H  . insulin aspart  0-5 Units Subcutaneous QHS  . insulin aspart  0-9 Units Subcutaneous TID WC  . multivitamin with minerals  1 tablet Oral Daily  . sodium chloride flush  3 mL Intravenous Q12H      [name], MD 07/08/2017, 11:37 AM

## 2017-07-08 NOTE — Care Management Note (Signed)
Case Management Note Marvetta Gibbons RN, BSN Unit 4E-Case Manager [phone]  Patient Details  Name: [name] MRN: [id number] Date of Birth: March 14, 1976  Subjective/Objective:  Pt admitted with HTN emergency and acute on chronic RF. Pt will need to start HD- plan for 1st treatment today 3/27-with 3 straight HD days then transition to regular 3x week regiment- CLIP process started for outpt chair.                 Action/Plan: PTA pt lived at home with SO, plan to return home once medically stable and clipping process completed. - CM to follow for transition of care needs.   Expected Discharge Date:                  Expected Discharge Plan:  Home/Self Care  In-House Referral:     Discharge planning Services  CM Consult  Post Acute Care Choice:    Choice offered to:     DME Arranged:    DME Agency:     HH Arranged:    HH Agency:     Status of Service:  In process, will continue to follow  If discussed at Long Length of Stay Meetings, dates discussed:    Discharge Disposition:   Additional Comments:  Dawayne Patricia, RN 07/08/2017, 12:22 PM

## 2017-07-08 NOTE — Progress Notes (Signed)
Upper Extremity Vein Map  Right Cephalic  Segment Diameter Depth Comment  1. Axilla 3.110mm 7.60mm   2. Mid upper arm 3.61mm 4.74mm Branch  3. Above AC 3.69mm 7.23mm   4. In [facility] 4.55mm 3.35mm Thrombus  5. Below AC 3.22mm 7.71mm   6. Mid forearm 2.80mm 5.48mm   7. Wrist 1.33mm 2.51mm                   Right Basilic Not visualized  Left Cephalic  Segment Diameter Depth Comment  1. Axilla 3.35mm 7.68mm   2. Mid upper arm 2.67mm 4.50mm   3. Above AC 3.65mm 5.31mm   4. In [facility] 2.73mm 3.19mm   5. Below AC 2.48mm 5.25mm Branch  6. Mid forearm 2.32mm 4.34mm   7. Wrist 1.18mm 2.17mm                   Left Basilic  Segment Diameter Depth Comment  1. Axilla   Not visualized  2. Mid upper arm 4.75mm 13.47mm   3. Above AC 1.26mm 13.3mm   4. In [facility]   Not visualized  5. Below [facility]   Not visualized  6. Mid forearm   Not visualized  7. Wrist   Not visualized                  07/08/2017 11:01 AM Maudry Mayhew, BS, RVT, RDCS, RDMS

## 2017-07-08 NOTE — Progress Notes (Signed)
PROGRESS NOTE    SHYHIEM BEENEY  GQQ:[id number] DOB: 04/27/1975 DOA: 07/06/2017 PCP: Larene Beach, MD      Brief Narrative:  Mr. Cripps is a 42 yo M with CKD V followed by [facility], T2DM, CAD s/p CABG, CHF EF 45% who presents with subacute dyspnea, swelling found to worsening renal failure, hypertensive emergency, CHF.  Started on nitroglycerin drip, nephrology consulted, dialysis access placed.     Assessment & Plan:  Severe hypertension Acute on chronic systolic CHF Coronary artery disease Elevated troponin Troponin level elevated but flat, not ACS.  BP is still elevated, still requires continuous IV nitro.  Echo today shows normal valves, grade 2 DD, normal EF. -Titrate nitroglycerin off after dialysis, if unable after two HD sessions, will add fourth agent -Continue amlodipine, carvedilol, clonidine, Lasix -Strict I/Os daily weihts -Continue aspirin, statin, beta-blocker   Acute on chronic renal failure Chronic kidney disease stage V TDC placed by VVS yesterday -Dialysis per nephrology  Diabetes -Continue SSI -Hold metformin  Anemia of chronic renal disease -Per nephrology  OSA -Start CPAP at night       DVT prophylaxis: Heparin Code Status: FULL Family Communication: Fiancee baseline MDM and disposition Plan: The below labs and imaging reports were reviewed.  The patient presented with severe exacerbation of his chronic systolic CHF, and worsening of his chronic renal failure, ie multiple organ acute on chronic failure.  He now requires continued IV nitroglycerin, several more days hemodialysis consecutively.         Consultants:   Nephrology  VVS  Procedures:   [facility] catheter placement 3/26  Echocardiogram 3/27  Antimicrobials:   None    Subjective: Feels somewhat ebtter, still dyspenci with any movement.  No chest pain, no confusion.  No sleepiness.  No vomiting.  No fever.  Objective: Vitals:   07/08/17 1105 07/08/17 1219 07/08/17  1632 07/08/17 1646  BP: (!) 176/75 (!) 171/76 (!) 159/67 (!) (P) 174/78  Pulse:  68 66 (P) 68  Resp:  18 18 (P) 12  Temp:  98.3 F (36.8 C) 98.2 F (36.8 C) (P) 98.2 F (36.8 C)  TempSrc:  Oral Oral (P) Oral  SpO2:  96% 92%   Weight:      Height:        Intake/Output Summary (Last 24 hours) at 07/08/2017 1652 Last data filed at 07/08/2017 0830 Gross per 24 hour  Intake 1715.33 ml  Output 850 ml  Net 865.33 ml   Filed Weights   07/07/17 0016 07/07/17 0346 07/08/17 0151  Weight: (!) 137 kg (302 lb 0.5 oz) (!) 137 kg (302 lb 0.5 oz) 128.5 kg (283 lb 4.8 oz)    Examination: General appearance: adult male, alert and in no acute distress.   HEENT: Anicteric, conjunctiva pink, lids and lashes normal. No nasal deformity, discharge, epistaxis.  Lips moist.   Skin: Warm and dry.  No suspicious rashes or lesions. Anasarcic. Cardiac: RRR, nl S1-S2, no murmurs appreciated.  Capillary refill is brisk.  JVP not visible.  Pitting edema in legs, arms.  Radial pulses 2+ and symmetric. Respiratory: Normal respiratory rate and rhythm.  CTAB without rales or wheezes.  Dyspneic with exertion. Abdomen: Abdomen soft.  No TTP. No ascites, distension, hepatosplenomegaly.   MSK: No deformities or effusions. Neuro: Awake and alert.  EOMI, moves all extremities. Speech fluent.    Psych: Sensorium intact and responding to questions, nattention normal. Affect normal.  Judgment and insight appear normal.    Data Reviewed: I  have personally reviewed following labs and imaging studies:  CBC: Recent Labs  Lab 07/06/17 0002  WBC 9.1  HGB 8.5*  HCT 26.7*  MCV 82.4  PLT 629   Basic Metabolic Panel: Recent Labs  Lab 07/06/17 0002 07/07/17 0004  NA 140 139  K 4.1 3.9  CL 104 106  CO2 22 23  GLUCOSE 176* 184*  BUN 69* 69*  CREATININE 8.86* 9.22*  CALCIUM 8.4* 8.3*   GFR: Estimated Creatinine Clearance: 13.4 mL/min (A) (by C-G formula based on SCr of 9.22 mg/dL (H)). Liver Function Tests: No  results for input(s): AST, ALT, ALKPHOS, BILITOT, PROT, ALBUMIN in the last 168 hours. No results for input(s): LIPASE, AMYLASE in the last 168 hours. No results for input(s): AMMONIA in the last 168 hours. Coagulation Profile: No results for input(s): INR, PROTIME in the last 168 hours. Cardiac Enzymes: Recent Labs  Lab 07/06/17 0236 07/06/17 0935  TROPONINI 0.07* 0.07*   BNP (last 3 results) No results for input(s): PROBNP in the last 8760 hours. HbA1C: Recent Labs    07/06/17 0300  HGBA1C 6.5*   CBG: Recent Labs  Lab 07/07/17 1614 07/07/17 1952 07/07/17 2239 07/08/17 0610 07/08/17 1214  GLUCAP 174* 175* 221* 170* 210*   Lipid Profile: Recent Labs    07/06/17 0300  CHOL 104  HDL 31*  LDLCALC 55  TRIG 88  CHOLHDL 3.4   Thyroid Function Tests: No results for input(s): TSH, T4TOTAL, FREET4, T3FREE, THYROIDAB in the last 72 hours. Anemia Panel: Recent Labs    07/06/17 0108 07/07/17 0004  VITAMINB12 1,006* 941*  FOLATE 12.4 10.1  FERRITIN 87 79  TIBC 256 235*  IRON 34* 18*  RETICCTPCT 1.3 1.4   Urine analysis:    Component Value Date/Time   COLORURINE STRAW (A) 07/06/2017 1006   APPEARANCEUR CLEAR 07/06/2017 1006   LABSPEC 1.010 07/06/2017 1006   PHURINE 5.0 07/06/2017 1006   GLUCOSEU 50 (A) 07/06/2017 1006   HGBUR NEGATIVE 07/06/2017 1006   BILIRUBINUR NEGATIVE 07/06/2017 1006   BILIRUBINUR negative 08/18/2016 1702   KETONESUR NEGATIVE 07/06/2017 1006   PROTEINUR >=300 (A) 07/06/2017 1006   UROBILINOGEN 0.2 08/18/2016 1702   UROBILINOGEN 1.0 12/23/2008 0539   NITRITE NEGATIVE 07/06/2017 1006   LEUKOCYTESUR NEGATIVE 07/06/2017 1006   Sepsis Labs: @LABRCNTIP (procalcitonin:4,lacticacidven:4)  ) Recent Results (from the past 240 hour(s))  Surgical pcr screen     Status: None   Collection Time: 07/06/17 11:16 PM  Result Value Ref Range Status   MRSA, PCR NEGATIVE NEGATIVE Final   Staphylococcus aureus NEGATIVE NEGATIVE Final    Comment:  (NOTE) The Xpert SA Assay (FDA approved for NASAL specimens in patients 23 years of age and older), is one component of a comprehensive surveillance program. It is not intended to diagnose infection nor to guide or monitor treatment. Performed at [facility], [place] [address] [place], [place] [address]          Radiology Studies: Dg Chest Port 1 View  Result Date: 07/07/2017 CLINICAL DATA:  Followup catheter placement. EXAM: PORTABLE CHEST 1 VIEW COMPARISON:  07/06/2017 FINDINGS: Right internal jugular catheter has been placed. The tip is at the SVC RA junction. No pneumothorax. Venous hypertension and interstitial edema persist with small effusions, right more than left. IMPRESSION: Catheter tip at the SVC RA junction.  No complication. Electronically Signed   By: Nelson Chimes M.D.   On: 07/07/2017 14:39   Dg Fluoro Guide Cv Line-no Report  Result Date: 07/07/2017  Fluoroscopy was utilized by the requesting physician.  No radiographic interpretation.        Scheduled Meds: . amLODipine  10 mg Oral Daily  . aspirin EC  81 mg Oral Daily  . atorvastatin  20 mg Oral Q breakfast  . carvedilol  25 mg Oral BID WC  . cholecalciferol  1,000 Units Oral Daily  . cloNIDine  0.2 mg Oral TID  . darbepoetin (ARANESP) injection - DIALYSIS  60 mcg Intravenous Q Wed-HD  . furosemide  80 mg Intravenous TID  . heparin  5,000 Units Subcutaneous Q8H  . insulin aspart  0-5 Units Subcutaneous QHS  . insulin aspart  0-9 Units Subcutaneous TID WC  . multivitamin with minerals  1 tablet Oral Daily  . sodium chloride flush  3 mL Intravenous Q12H   Continuous Infusions: . sodium chloride    . sodium chloride    . sodium chloride    . sodium chloride 10 mL/hr at 07/07/17 1530  . ferric gluconate (FERRLECIT/NULECIT) IV    . nitroGLYCERIN 150 mcg/min (07/08/17 0558)     LOS: 2 days    Time spent: 35 minutes    Edwin Dada, MD [facility] 07/08/2017, 4:52 PM      Pager [phone] --- please page though AMION:  [email or web address] Password [email or web address] If 7PM-7AM, please contact night-coverage

## 2017-07-08 NOTE — Progress Notes (Signed)
Patient ID: [name], male   DOB: 03-06-1976, 42 y.o.   MRN: [id number] Had successful dialysis via catheter. Discussed vein map with patient and his family present.  Has moderate size cephalic veins bilaterally.  He is right-handed.  Will continue to follow as he continued clinically improved from his volume overload.  We will plan left arm AV fistula creation when clinically stable

## 2017-07-09 LAB — RENAL FUNCTION PANEL
Albumin: 1.8 g/dL — ABNORMAL LOW (ref 3.5–5.0)
Anion gap: 12 (ref 5–15)
BUN: 61 mg/dL — AB (ref 6–20)
CALCIUM: 7.7 mg/dL — AB (ref 8.9–10.3)
CHLORIDE: 102 mmol/L (ref 101–111)
CO2: 23 mmol/L (ref 22–32)
CREATININE: 8.52 mg/dL — AB (ref 0.61–1.24)
GFR calc Af Amer: 8 mL/min — ABNORMAL LOW (ref >60)
GFR calc non Af Amer: 7 mL/min — ABNORMAL LOW (ref >60)
GLUCOSE: 201 mg/dL — AB (ref 65–99)
Phosphorus: 5.9 mg/dL — ABNORMAL HIGH (ref 2.5–4.6)
Potassium: 3.6 mmol/L (ref 3.5–5.1)
SODIUM: 137 mmol/L (ref 135–145)

## 2017-07-09 LAB — HEPATITIS B CORE ANTIBODY, TOTAL: Hep B Core Total Ab: NEGATIVE

## 2017-07-09 LAB — HEPATITIS B SURFACE ANTIBODY,QUALITATIVE: HEP B S AB: NONREACTIVE

## 2017-07-09 LAB — BPAM RBC
Blood Product Expiration Date: 201904022359
ISSUE DATE / TIME: 201903271819
UNIT TYPE AND RH: 6200

## 2017-07-09 LAB — CBC
HCT: 22.4 % — ABNORMAL LOW (ref 39.0–52.0)
Hemoglobin: 7.4 g/dL — ABNORMAL LOW (ref 13.0–17.0)
MCH: 27.3 pg (ref 26.0–34.0)
MCHC: 33 g/dL (ref 30.0–36.0)
MCV: 82.7 fL (ref 78.0–100.0)
PLATELETS: 233 10*3/uL (ref 150–400)
RBC: 2.71 MIL/uL — ABNORMAL LOW (ref 4.22–5.81)
RDW: 15.8 % — AB (ref 11.5–15.5)
WBC: 9.2 10*3/uL (ref 4.0–10.5)

## 2017-07-09 LAB — GLUCOSE, CAPILLARY
GLUCOSE-CAPILLARY: 132 mg/dL — AB (ref 65–99)
GLUCOSE-CAPILLARY: 223 mg/dL — AB (ref 65–99)
Glucose-Capillary: 245 mg/dL — ABNORMAL HIGH (ref 65–99)
Glucose-Capillary: 185 mg/dL — ABNORMAL HIGH (ref 65–99)

## 2017-07-09 LAB — TYPE AND SCREEN
ABO/RH(D): A POS
Antibody Screen: NEGATIVE
UNIT DIVISION: 0

## 2017-07-09 MED ORDER — HYDRALAZINE HCL 25 MG PO TABS
25.0000 mg | ORAL_TABLET | Freq: Three times a day (TID) | ORAL | Status: DC
  Administered 2017-07-09 – 2017-07-10 (×3): 25 mg via ORAL
  Filled 2017-07-09 (×3): qty 1

## 2017-07-09 MED ORDER — INSULIN DETEMIR 100 UNIT/ML ~~LOC~~ SOLN
5.0000 [IU] | Freq: Every day | SUBCUTANEOUS | Status: DC
  Administered 2017-07-10: 5 [IU] via SUBCUTANEOUS
  Filled 2017-07-09 (×2): qty 0.05

## 2017-07-09 NOTE — Progress Notes (Signed)
Inpatient Diabetes Program Recommendations  AACE/ADA: New Consensus Statement on Inpatient Glycemic Control (2015)  Target Ranges:  Prepandial:   less than 140 mg/dL      Peak postprandial:   less than 180 mg/dL (1-2 hours)      Critically ill patients:  140 - 180 mg/dL   Lab Results  Component Value Date   GLUCAP 245 (H) 07/09/2017   HGBA1C 6.5 (H) 07/06/2017    Review of Glycemic ControlResults for [name], Kenneth "LAMONT" (MRN [id number]) as of 07/09/2017 11:11  Ref. Range 07/08/2017 06:10 07/08/2017 12:14 07/08/2017 16:24 07/08/2017 20:08 07/09/2017 06:16  Glucose-Capillary Latest Ref Range: 65 - 99 mg/dL 170 (H) 210 (H) 232 (H) 202 (H) 245 (H)    Diabetes history: Type 2 DM Outpatient Diabetes medications: Amaryl 2 mg daily Current orders for Inpatient glycemic control:  Novolog sensitive tid with meals and HS Inpatient Diabetes Program Recommendations:   Note blood sugars>goal.  Consider adding Levemir 10 units daily while patient is in the hospital.   Thanks,  Kenneth Perl, [name], [name] Inpatient Diabetes Coordinator Pager [phone] (8a-5p)

## 2017-07-09 NOTE — Progress Notes (Signed)
   [facility] Progress Note   Interval History:42 y.o.malewith medical history significant ofCKD-V, hypertension, hyperlipidemia, diabetes mellitus, CAD, CABG, sCHF with EF 45%, obesity. He presented with dyspnea and leg edema, found to have acute on chronic kidney injury and hypertensive emergency.   Assessment/ Plan:   1.Progressive renal insufficiency Stage 5 Will need to start dialysis will need access placed Diabetic nephropathy - CLIP process - HD #2 today; plan for 3 straight dialysis days and then will put on regular thrice weekly regimen. - Next HD Fri for 4hrs. 2. Hypertension NTG and diuresis with IV lasix for now 3. AnemiaTsats low Start iron and esa 4. Access access placementappreciate Dr Bridgett Larsson placing the TC RIJ.    Subjective:   Unstable after MAC for the catheter yest but feels more stable today walking to the restroom. Denies cough, f/c/n/v.  Denies dysnpea.   Objective:   BP (!) 167/77   Pulse 64   Temp 98.3 F (36.8 C) (Oral)   Resp (!) 21   Ht _0  (1.702 m)   Wt 127.3 kg (280 lb 10.3 oz)   SpO2 97%   BMI 43.96 kg/m   Intake/Output Summary (Last 24 hours) at 07/09/2017 1436 Last data filed at 07/09/2017 1300 Gross per 24 hour  Intake 3834.5 ml  Output 3200 ml  Net 634.5 ml   Weight change: 1.796 kg (3 lb 15.4 oz)  Physical Exam: NAD A&Ox3 Access: RIJ TC CVS- RRR RS- CTA ABD- BS present soft non-distended EXT-4 +edema    Imaging: Dg Fluoro Guide Cv Line-no Report  Result Date: 07/07/2017 Fluoroscopy was utilized by the requesting physician.  No radiographic interpretation.    Labs: BMET Recent Labs  Lab 07/06/17 0002 07/07/17 0004 07/08/17 1722  NA 140 139 136  K 4.1 3.9 3.9  CL 104 106 104  CO2 _1 GLUCOSE 176* 184* 235*  BUN 69* 69* 73*  CREATININE 8.86* 9.22* 9.09*  CALCIUM 8.4* 8.3* 7.9*  PHOS  --   --  6.6*   CBC Recent Labs  Lab 07/06/17 0002 07/08/17 1722 07/08/17 1815  07/09/17 1258  WBC 9.1 10.5  --  9.2  HGB 8.5* 6.4* 6.7* 7.4*  HCT 26.7* 20.0* 20.9* 22.4*  MCV 82.4 83.7  --  82.7  PLT 290 218  --  233    Medications:    . amLODipine  10 mg Oral Daily  . aspirin EC  81 mg Oral Daily  . atorvastatin  20 mg Oral Q breakfast  . carvedilol  25 mg Oral BID WC  . cholecalciferol  1,000 Units Oral Daily  . cloNIDine  0.2 mg Oral TID  . darbepoetin (ARANESP) injection - DIALYSIS  60 mcg Intravenous Q Wed-HD  . furosemide  80 mg Intravenous TID  . heparin  5,000 Units Subcutaneous Q8H  . hydrALAZINE  25 mg Oral Q8H  . insulin aspart  0-5 Units Subcutaneous QHS  . insulin aspart  0-9 Units Subcutaneous TID WC  . insulin detemir  5 Units Subcutaneous Daily  . multivitamin with minerals  1 tablet Oral Daily      [name], MD 07/09/2017, 2:36 PM

## 2017-07-09 NOTE — Progress Notes (Signed)
PROGRESS NOTE    [name]  BHA:[id number] DOB: 1975/07/25 DOA: 07/06/2017 PCP: Larene Beach, MD      Brief Narrative:  [name] is a 42 yo M with CKD V followed by [facility], T2DM, CAD s/p CABG, CHF EF 45% who presents with subacute dyspnea, swelling found to worsening renal failure, hypertensive emergency, CHF.  Started on nitroglycerin drip, nephrology consulted, dialysis access placed.     Assessment & Plan:  Severe range hypertension Acute on chronic systolic CHF Coronary artery disease status post CABG Elevated troponin  Troponin level elevated but flat, not ACS.  Blood pressure still requiring continuous IV nitroglycerin.  Echo yesterday showed normal valves, grade 2 DD, normal EF. -Continue to wean nitroglycerin as able -Add hydralazine -Continue amlodipine, carvedilol, clonidine, Lasix -Continue strict I's and O's, daily weights -Continue aspirin, statin, beta-blocker    Acute on chronic renal failure Chronic kidney disease stage V TDC placed by VVS yesterday -Dialysis per nephrology  Diabetes Blood sugars elevated. -Add very low-dose Levemir -Continue SSI -Hold metformin    Anemia of chronic renal disease He does have a considerable drop in his hemoglobin since admission.  Yesterday afternoon hemoglobin less than 7, no tachycardia, no signs of bleeding near new TDC.  No abdominal pain nor other findings to suggest intraabdominal bleeding, no signs of melena or rectal bleeding.  Also no dizziness with standing, no change in shortness of breath.  Transfused 1 unit yesterday. -Transfusion threshold less than 8 g/dL -Repeat CBC  OSA -CPAP at night      DVT prophylaxis: Heparin Code Status: FULL Family Communication: Fiancee baseline MDM and disposition Plan: The below labs and imaging reports were reviewed.  He presented with a severe exacerbation of CHF and worsening of his renal failure to end-stage renal disease, and otherwise multiple organ  acute on chronic failure.  He continues to require IV nitroglycerin continuously, and will have to have several more days of consecutive dialysis, then initiate daily dialysis.      Consultants:   Nephrology  VVS  Procedures:   [facility] catheter placement 3/26  Echocardiogram 3/27  Antimicrobials:   None    Subjective: No change from yesterday, still dyspneic with movement.  No chest pain, dizziness with standing, no fever.  No melena, no abdominal pain, no pain in his chest.     Objective: Vitals:   07/09/17 0450 07/09/17 0751 07/09/17 0812 07/09/17 1154  BP: (!) 166/68 (!) 162/72 (!) 156/70 (!) 168/73  Pulse:  72  64  Resp: (!) 21 18  18   Temp:  99.9 F (37.7 C)  98.3 F (36.8 C)  TempSrc:  Oral  Oral  SpO2:  96%  97%  Weight:      Height:        Intake/Output Summary (Last 24 hours) at 07/09/2017 1402 Last data filed at 07/09/2017 1300 Gross per 24 hour  Intake 3834.5 ml  Output 3200 ml  Net 634.5 ml   Filed Weights   07/08/17 1646 07/08/17 1851 07/09/17 0338  Weight: 130.3 kg (287 lb 4.2 oz) 127 kg (279 lb 15.8 oz) 126.6 kg (279 lb 1.6 oz)    Examination: General appearance: Adult male, lying in bed, sits on the side of the bed eating breakfast.   Appears tired. HEENT: Clear normal, conjunctive and lids and lashes normal.  No nasal deformity or epistaxis.  OP moist, no lesions.  Lips and gums normal.     Skin: Old CABG scar.  Skin warm  and dry, without suspicious rash or lesion, anasarca still present.    Cardiac: Slightly tachycardic, no murmurs.  Severe pitting edema bilaterally in the lower extremities.    Respiratory: Respiratory effort normal at rest.  No rales or wheezes on exam.    Abdomen: Abdomen soft without tenderness to palpation, distention, HSM.    MSK: No deformities or effusions. Neuro: Awake and alert.  EOMI, moves all extremities. Speech fluent.    Psych: Sensorium intact, oriented to person place and time, affect normal, judgment and  insight normal.   Data Reviewed: I have personally reviewed following labs and imaging studies:  CBC: Recent Labs  Lab 07/06/17 0002 07/08/17 1722 07/08/17 1815  WBC 9.1 10.5  --   HGB 8.5* 6.4* 6.7*  HCT 26.7* 20.0* 20.9*  MCV 82.4 83.7  --   PLT 290 218  --    Basic Metabolic Panel: Recent Labs  Lab 07/06/17 0002 07/07/17 0004 07/08/17 1722  NA 140 139 136  K 4.1 3.9 3.9  CL 104 106 104  CO2 22 23 22   GLUCOSE 176* 184* 235*  BUN 69* 69* 73*  CREATININE 8.86* 9.22* 9.09*  CALCIUM 8.4* 8.3* 7.9*  PHOS  --   --  6.6*   GFR: Estimated Creatinine Clearance: 13.5 mL/min (A) (by C-G formula based on SCr of 9.09 mg/dL (H)). Liver Function Tests: Recent Labs  Lab 07/08/17 1722  ALBUMIN 2.2*   No results for input(s): LIPASE, AMYLASE in the last 168 hours. No results for input(s): AMMONIA in the last 168 hours. Coagulation Profile: No results for input(s): INR, PROTIME in the last 168 hours. Cardiac Enzymes: Recent Labs  Lab 07/06/17 0236 07/06/17 0935  TROPONINI 0.07* 0.07*   BNP (last 3 results) No results for input(s): PROBNP in the last 8760 hours. HbA1C: No results for input(s): HGBA1C in the last 72 hours. CBG: Recent Labs  Lab 07/08/17 1214 07/08/17 1624 07/08/17 2008 07/09/17 0616 07/09/17 1151  GLUCAP 210* 232* 202* 245* 185*   Lipid Profile: No results for input(s): CHOL, HDL, LDLCALC, TRIG, CHOLHDL, LDLDIRECT in the last 72 hours. Thyroid Function Tests: No results for input(s): TSH, T4TOTAL, FREET4, T3FREE, THYROIDAB in the last 72 hours. Anemia Panel: Recent Labs    07/07/17 0004  VITAMINB12 941*  FOLATE 10.1  FERRITIN 79  TIBC 235*  IRON 18*  RETICCTPCT 1.4   Urine analysis:    Component Value Date/Time   COLORURINE STRAW (A) 07/06/2017 1006   APPEARANCEUR CLEAR 07/06/2017 1006   LABSPEC 1.010 07/06/2017 1006   PHURINE 5.0 07/06/2017 1006   GLUCOSEU 50 (A) 07/06/2017 1006   HGBUR NEGATIVE 07/06/2017 1006   BILIRUBINUR  NEGATIVE 07/06/2017 1006   BILIRUBINUR negative 08/18/2016 1702   KETONESUR NEGATIVE 07/06/2017 1006   PROTEINUR >=300 (A) 07/06/2017 1006   UROBILINOGEN 0.2 08/18/2016 1702   UROBILINOGEN 1.0 12/23/2008 0539   NITRITE NEGATIVE 07/06/2017 1006   LEUKOCYTESUR NEGATIVE 07/06/2017 1006   Sepsis Labs: @LABRCNTIP (procalcitonin:4,lacticacidven:4)  ) Recent Results (from the past 240 hour(s))  Surgical pcr screen     Status: None   Collection Time: 07/06/17 11:16 PM  Result Value Ref Range Status   MRSA, PCR NEGATIVE NEGATIVE Final   Staphylococcus aureus NEGATIVE NEGATIVE Final    Comment: (NOTE) The Xpert SA Assay (FDA approved for NASAL specimens in patients 45 years of age and older), is one component of a comprehensive surveillance program. It is not intended to diagnose infection nor to guide or monitor treatment. Performed at  [facility], [place] [address] [place], [place] [address]          Radiology Studies: Dg Chest Port 1 View  Result Date: 07/07/2017 CLINICAL DATA:  Followup catheter placement. EXAM: PORTABLE CHEST 1 VIEW COMPARISON:  07/06/2017 FINDINGS: Right internal jugular catheter has been placed. The tip is at the SVC RA junction. No pneumothorax. Venous hypertension and interstitial edema persist with small effusions, right more than left. IMPRESSION: Catheter tip at the SVC RA junction.  No complication. Electronically Signed   By: Nelson Chimes M.D.   On: 07/07/2017 14:39   Dg Fluoro Guide Cv Line-no Report  Result Date: 07/07/2017 Fluoroscopy was utilized by the requesting physician.  No radiographic interpretation.        Scheduled Meds: . amLODipine  10 mg Oral Daily  . aspirin EC  81 mg Oral Daily  . atorvastatin  20 mg Oral Q breakfast  . carvedilol  25 mg Oral BID WC  . cholecalciferol  1,000 Units Oral Daily  . cloNIDine  0.2 mg Oral TID  . darbepoetin (ARANESP) injection - DIALYSIS  60 mcg Intravenous Q Wed-HD  . furosemide  80 mg  Intravenous TID  . heparin  5,000 Units Subcutaneous Q8H  . hydrALAZINE  25 mg Oral Q8H  . insulin aspart  0-5 Units Subcutaneous QHS  . insulin aspart  0-9 Units Subcutaneous TID WC  . insulin detemir  5 Units Subcutaneous Daily  . multivitamin with minerals  1 tablet Oral Daily   Continuous Infusions: . ferric gluconate (FERRLECIT/NULECIT) IV Stopped (07/08/17 1942)  . nitroGLYCERIN 170 mcg/min (07/09/17 0648)     LOS: 3 days    Time spent: 35 minutes    Edwin Dada, MD [facility] 07/09/2017, 11:00 AM     Pager [phone] --- please page though AMION:  [email or web address] Password [email or web address] If 7PM-7AM, please contact night-coverage

## 2017-07-09 NOTE — Progress Notes (Signed)
    Patient with uncontrolled hypertension still requiring Nitro drip.   Currently started 3 day in a row of HD via [place].   Plan Left UE AV fistula creation once patient is stable and BP is controlled.   Roxy Horseman PA-C

## 2017-07-10 LAB — CBC
HCT: 23 % — ABNORMAL LOW (ref 39.0–52.0)
Hemoglobin: 7.2 g/dL — ABNORMAL LOW (ref 13.0–17.0)
MCH: 26.2 pg (ref 26.0–34.0)
MCHC: 31.3 g/dL (ref 30.0–36.0)
MCV: 83.6 fL (ref 78.0–100.0)
PLATELETS: 244 10*3/uL (ref 150–400)
RBC: 2.75 MIL/uL — ABNORMAL LOW (ref 4.22–5.81)
RDW: 15.7 % — ABNORMAL HIGH (ref 11.5–15.5)
WBC: 9.9 10*3/uL (ref 4.0–10.5)

## 2017-07-10 LAB — BASIC METABOLIC PANEL
Anion gap: 9 (ref 5–15)
BUN: 42 mg/dL — ABNORMAL HIGH (ref 6–20)
CALCIUM: 7.9 mg/dL — AB (ref 8.9–10.3)
CO2: 26 mmol/L (ref 22–32)
CREATININE: 6.91 mg/dL — AB (ref 0.61–1.24)
Chloride: 100 mmol/L — ABNORMAL LOW (ref 101–111)
GFR calc Af Amer: 10 mL/min — ABNORMAL LOW (ref >60)
GFR, EST NON AFRICAN AMERICAN: 9 mL/min — AB (ref >60)
Glucose, Bld: 252 mg/dL — ABNORMAL HIGH (ref 65–99)
Potassium: 4 mmol/L (ref 3.5–5.1)
SODIUM: 135 mmol/L (ref 135–145)

## 2017-07-10 LAB — GLUCOSE, CAPILLARY
GLUCOSE-CAPILLARY: 227 mg/dL — AB (ref 65–99)
GLUCOSE-CAPILLARY: 234 mg/dL — AB (ref 65–99)
Glucose-Capillary: 232 mg/dL — ABNORMAL HIGH (ref 65–99)
Glucose-Capillary: 166 mg/dL — ABNORMAL HIGH (ref 65–99)

## 2017-07-10 MED ORDER — HYDRALAZINE HCL 50 MG PO TABS
50.0000 mg | ORAL_TABLET | Freq: Three times a day (TID) | ORAL | Status: DC
  Administered 2017-07-10 – 2017-07-16 (×16): 50 mg via ORAL
  Filled 2017-07-10 (×18): qty 1

## 2017-07-10 MED ORDER — FUROSEMIDE 80 MG PO TABS
80.0000 mg | ORAL_TABLET | Freq: Three times a day (TID) | ORAL | Status: DC
  Administered 2017-07-10 – 2017-07-16 (×17): 80 mg via ORAL
  Filled 2017-07-10 (×18): qty 1

## 2017-07-10 MED ORDER — INSULIN DETEMIR 100 UNIT/ML ~~LOC~~ SOLN
10.0000 [IU] | Freq: Every day | SUBCUTANEOUS | Status: DC
  Administered 2017-07-11 – 2017-07-12 (×2): 10 [IU] via SUBCUTANEOUS
  Filled 2017-07-10 (×3): qty 0.1

## 2017-07-10 MED ORDER — HYDRALAZINE HCL 20 MG/ML IJ SOLN
10.0000 mg | Freq: Once | INTRAMUSCULAR | Status: AC
  Administered 2017-07-10: 10 mg via INTRAVENOUS
  Filled 2017-07-10: qty 1

## 2017-07-10 NOTE — Progress Notes (Addendum)
2100-  Patient IV nitro stopped at present d/t patient's c/o pain and discomfort at site.  IV team consult placed.   2220-  IV team, Jenn reported that she used ultrasound to place IV to right arm but was unsuccessful x 2. This nurse made attending MD aware.  2235-  New orders noted to place midlineb.  2256-  Nephrologist Hollie Salk on call was updated about midline placement. Unable to place in right arm d/t swelling. Left arm remains on reserve at this time. Nephrologist says no to midline to left arm.   2305-Attending MD Blount paged and made aware. B/p- 161/69, 163/73, 176/76- last blood pressures taken since drip has been off.   Awaiting orders at this time.   2338- Paged nephrologist Hollie Salk  for permission to use HD port in chest-(only access). Attending MD changing IV lasix to po.   2343- Nephrologist Hollie Salk says no to HD cath. Attending Blount made aware. Call parameters set for sbp greater than 180.

## 2017-07-10 NOTE — Progress Notes (Signed)
  [name] Progress Note   42 y.o.malewith medical history significant ofCKD-V, hypertension, hyperlipidemia, diabetes mellitus, CAD, CABG, sCHF with EF 45%, obesity. He presented with dyspnea and leg edema, found to have acute on chronic kidney injury and hypertensive emergency.   Assessment/ Plan:   1.Progressive renal insufficiency Stage 5 Will need to start dialysis will need access placed Diabetic nephropathy -CLIP process - HD #3 scheduled for today.  He has a massive amount of fluid still onboard; I recommended CVVHD for 2-3 days to remove a significant amount of fluid (he has at least 30 lbs of excessive fluid onboard) but he is hesitant. He has been through a lot and wants to think about it. If he declines then I will need to HD again Sat to UF.  2. Hypertension NTG and diuresis with IV lasix for now 3. AnemiaTsats low Start iron and esa 4. Access access placementappreciate DrChen placing the TC RIJ.    Subjective:   More stable walking now but still difficult with all the edema in the legs. Denies cough, f/c/n/v.  Denies dysnpea.   Objective:   BP (!) 167/71   Pulse 67   Temp 98.3 F (36.8 C) (Oral)   Resp 18   Ht 5\' 7"  (1.702 m)   Wt 124.8 kg (275 lb 1.6 oz)   SpO2 96%   BMI 43.09 kg/m   Intake/Output Summary (Last 24 hours) at 07/10/2017 0949 Last data filed at 07/10/2017 0900 Gross per 24 hour  Intake 1327.67 ml  Output 3450 ml  Net -2122.33 ml   Weight change: -3 kg (-6 lb 9.8 oz)  Physical Exam: NAD A&Ox3 Access: RIJ TC CVS- RRR RS- CTA ABD- BS present soft non-distended EXT-4 +edema    Imaging: No results found.  Labs: BMET Recent Labs  Lab 07/06/17 0002 07/07/17 0004 07/08/17 1722 07/09/17 1258  NA 140 139 136 137  K 4.1 3.9 3.9 3.6  CL 104 106 104 102  CO2 22 23 22 23   GLUCOSE 176* 184* 235* 201*  BUN 69* 69* 73* 61*  CREATININE 8.86* 9.22* 9.09* 8.52*  CALCIUM 8.4* 8.3* 7.9* 7.7*   PHOS  --   --  6.6* 5.9*   CBC Recent Labs  Lab 07/06/17 0002 07/08/17 1722 07/08/17 1815 07/09/17 1258  WBC 9.1 10.5  --  9.2  HGB 8.5* 6.4* 6.7* 7.4*  HCT 26.7* 20.0* 20.9* 22.4*  MCV 82.4 83.7  --  82.7  PLT 290 218  --  233    Medications:    . amLODipine  10 mg Oral Daily  . aspirin EC  81 mg Oral Daily  . atorvastatin  20 mg Oral Q breakfast  . carvedilol  25 mg Oral BID WC  . cholecalciferol  1,000 Units Oral Daily  . cloNIDine  0.2 mg Oral TID  . darbepoetin (ARANESP) injection - DIALYSIS  60 mcg Intravenous Q Wed-HD  . furosemide  80 mg Intravenous TID  . heparin  5,000 Units Subcutaneous Q8H  . hydrALAZINE  25 mg Oral Q8H  . insulin aspart  0-5 Units Subcutaneous QHS  . insulin aspart  0-9 Units Subcutaneous TID WC  . insulin detemir  5 Units Subcutaneous Daily  . multivitamin with minerals  1 tablet Oral Daily      [name], MD 07/10/2017, 9:49 AM

## 2017-07-10 NOTE — Progress Notes (Signed)
PROGRESS NOTE    [name]  JKK:[id number] DOB: 1975/07/18 DOA: 07/06/2017 PCP: Larene Beach, MD      Brief Narrative:  Mr. Borkenhagen is a 42 yo M with CKD V followed by [facility], T2DM, CAD s/p CABG, CHF EF 45% who presents with subacute dyspnea, swelling found to worsening renal failure, hypertensive emergency, CHF.  Started on nitroglycerin drip, nephrology consulted, dialysis access placed.     Assessment & Plan:  Severe range hypertension Acute on chronic systolic CHF Coronary artery disease status post CABG Elevated troponin  Troponin elevated but flat, not ACS, blood pressure still urgently high, on IV nitroglycerin, echocardiogram normal EF, grade 2 diastolic dysfunction, normal valves. -Try to wean nitroglycerin if able -Continue hydralazine, increased dose -Continue amlodipine, carvedilol, clonidine, Lasix (all max doses) -No change to aspirin, statin, beta-blocker   -Continue strict I's and O's, daily weights    Acute on chronic renal failure Chronic kidney disease stage V TDC placed by VVS  -Vein mapping, dialysis per nephrology  Diabetes Blood sugars elevated. -Increase Levemir -Continue SSI -Discontinue metformin   Anemia of chronic renal disease He does have a considerable drop in his hemoglobin since admission.  Yesterday afternoon hemoglobin less than 7, no tachycardia, no signs of bleeding near new TDC.  No abdominal pain nor other findings to suggest intraabdominal bleeding, no signs of melena or rectal bleeding.  Also no dizziness with standing, no change in shortness of breath.  Transfused 1 unit 2 days ago.  Hemoglobin less than again this afternoon. -Transfusion threshold less than 8 g/dL -Repeat transfusion tomorrow  OSA -CPAP at night      DVT prophylaxis: Heparin Code Status: FULL Family Communication: Fiancee baseline MDM and disposition Plan:.  He presented initially with severe exacerbation of CHF and renal failure that had  progressed to end-stage renal disease.  He continues to require IV nitroglycerin continuously and requires continuous dialysis whch he is refusing.      Consultants:   Nephrology  VVS  Procedures:   [facility] catheter placement 3/26  Echocardiogram 3/27  Antimicrobials:   None    Subjective: Dyspnea persists, no chest pain, dizziness, fever, melena, abdominal pain, pain in the chest.  Swelling is severe.   Adult male, no acute distress, seen in dialysis.  Objective: Vitals:   07/10/17 1800 07/10/17 1815 07/10/17 1825 07/10/17 1830  BP: (!) 179/81 (!) 165/79 (!) 165/79 (!) 170/85  Pulse: 76 76 73 74  Resp: 18 17 16 16   Temp:   98.1 F (36.7 C)   TempSrc:   Axillary   SpO2: 100% 100% 100% 100%  Weight:      Height:        Intake/Output Summary (Last 24 hours) at 07/10/2017 1912 Last data filed at 07/10/2017 1825 Gross per 24 hour  Intake 910.95 ml  Output 4250 ml  Net -3339.05 ml   Filed Weights   07/09/17 1310 07/09/17 1615 07/10/17 0124  Weight: 127.3 kg (280 lb 10.3 oz) 124.1 kg (273 lb 9.5 oz) 124.8 kg (275 lb 1.6 oz)    Examination: General appearance: Interactive.  Lying in bed, seen in dialysis.    HEENT: Sclera are normal, conjunctive and lids and lashes are normal, nose is normal, OP is moist without lesions.    Skin: He has an old CABG scar, otherwise the skin is warm and dry without suspicious rashes lesions.  Anasarca.    Cardiac: Heart rate regular, no murmurs.  Severe pitting edema bilaterally, no change.  Respiratory: Normal respiratory effort, no rales or wheezes. Abdomen: Abdomen is soft without tenderness, no HSM, no distention. MSK: No deformities or effusions. Neuro: Awake and alert.  EOMI, moves all extremities. Speech fluent.    Psych: Sensorium intact, oriented to person place and time, affect normal, judgment and insight normal.   Data Reviewed: I have personally reviewed following labs and imaging studies:  CBC: Recent Labs  Lab  07-18-2017 0002 07/08/17 1722 07/08/17 1815 07/09/17 1258 07/10/17 0500  WBC 9.1 10.5  --  9.2 9.9  HGB 8.5* 6.4* 6.7* 7.4* 7.2*  HCT 26.7* 20.0* 20.9* 22.4* 23.0*  MCV 82.4 83.7  --  82.7 83.6  PLT 290 218  --  233 376   Basic Metabolic Panel: Recent Labs  Lab 07-18-17 0002 07/07/17 0004 07/08/17 1722 07/09/17 1258 07/10/17 0500  NA 140 139 136 137 135  K 4.1 3.9 3.9 3.6 4.0  CL 104 106 104 102 100*  CO2 22 23 22 23 26   GLUCOSE 176* 184* 235* 201* 252*  BUN 69* 69* 73* 61* 42*  CREATININE 8.86* 9.22* 9.09* 8.52* 6.91*  CALCIUM 8.4* 8.3* 7.9* 7.7* 7.9*  PHOS  --   --  6.6* 5.9*  --    GFR: Estimated Creatinine Clearance: 17.6 mL/min (A) (by C-G formula based on SCr of 6.91 mg/dL (H)). Liver Function Tests: Recent Labs  Lab 07/08/17 1722 07/09/17 1258  ALBUMIN 2.2* 1.8*   No results for input(s): LIPASE, AMYLASE in the last 168 hours. No results for input(s): AMMONIA in the last 168 hours. Coagulation Profile: No results for input(s): INR, PROTIME in the last 168 hours. Cardiac Enzymes: Recent Labs  Lab July 18, 2017 0236 07/18/17 0935  TROPONINI 0.07* 0.07*   BNP (last 3 results) No results for input(s): PROBNP in the last 8760 hours. HbA1C: No results for input(s): HGBA1C in the last 72 hours. CBG: Recent Labs  Lab 07/09/17 1709 07/09/17 2047 07/10/17 0040 07/10/17 0618 07/10/17 1117  GLUCAP 132* 223* 227* 234* 232*   Lipid Profile: No results for input(s): CHOL, HDL, LDLCALC, TRIG, CHOLHDL, LDLDIRECT in the last 72 hours. Thyroid Function Tests: No results for input(s): TSH, T4TOTAL, FREET4, T3FREE, THYROIDAB in the last 72 hours. Anemia Panel: No results for input(s): VITAMINB12, FOLATE, FERRITIN, TIBC, IRON, RETICCTPCT in the last 72 hours. Urine analysis:    Component Value Date/Time   COLORURINE STRAW (A) 07/18/2017 1006   APPEARANCEUR CLEAR 07-18-2017 1006   LABSPEC 1.010 2017-07-18 1006   PHURINE 5.0 07/18/17 1006   GLUCOSEU 50 (A)  07/18/2017 1006   HGBUR NEGATIVE 2017-07-18 1006   BILIRUBINUR NEGATIVE 18-Jul-2017 1006   BILIRUBINUR negative 08/18/2016 1702   KETONESUR NEGATIVE Jul 18, 2017 1006   PROTEINUR >=300 (A) 07/18/17 1006   UROBILINOGEN 0.2 08/18/2016 1702   UROBILINOGEN 1.0 12/23/2008 0539   NITRITE NEGATIVE 2017/07/18 1006   LEUKOCYTESUR NEGATIVE Jul 18, 2017 1006   Sepsis Labs: @LABRCNTIP (procalcitonin:4,lacticacidven:4)  ) Recent Results (from the past 240 hour(s))  Surgical pcr screen     Status: None   Collection Time: Jul 18, 2017 11:16 PM  Result Value Ref Range Status   MRSA, PCR NEGATIVE NEGATIVE Final   Staphylococcus aureus NEGATIVE NEGATIVE Final    Comment: (NOTE) The Xpert SA Assay (FDA approved for NASAL specimens in patients 39 years of age and older), is one component of a comprehensive surveillance program. It is not intended to diagnose infection nor to guide or monitor treatment. Performed at [facility], [place] [address], [place], [place] [address]  Radiology Studies: No results found.      Scheduled Meds: . amLODipine  10 mg Oral Daily  . aspirin EC  81 mg Oral Daily  . atorvastatin  20 mg Oral Q breakfast  . carvedilol  25 mg Oral BID WC  . cholecalciferol  1,000 Units Oral Daily  . cloNIDine  0.2 mg Oral TID  . darbepoetin (ARANESP) injection - DIALYSIS  60 mcg Intravenous Q Wed-HD  . furosemide  80 mg Intravenous TID  . heparin  5,000 Units Subcutaneous Q8H  . hydrALAZINE  50 mg Oral Q8H  . insulin aspart  0-5 Units Subcutaneous QHS  . insulin aspart  0-9 Units Subcutaneous TID WC  . [START ON 07/11/2017] insulin detemir  10 Units Subcutaneous Daily  . multivitamin with minerals  1 tablet Oral Daily   Continuous Infusions: . ferric gluconate (FERRLECIT/NULECIT) IV Stopped (07/08/17 1942)  . nitroGLYCERIN 200 mcg/min (07/10/17 0937)     LOS: 4 days    Time spent: 25 minutes    Edwin Dada, MD [facility] 07/10/2017,  2:15 PM     Pager [phone] --- please page though AMION:  [email or web address] Password [email or web address] If 7PM-7AM, please contact night-coverage

## 2017-07-10 NOTE — Progress Notes (Signed)
Accepted at [facility] .1st treatment Monday April 01,2019 at 11:15am Schedule Monday,Wednesday,Friday at 11:45am .[place]ai[place] time at11:45am

## 2017-07-10 NOTE — Progress Notes (Signed)
Placed patient on CPAP for the night via auto-mode with minimum pressure set at 4cm and maximum pressure set at 20cm.  

## 2017-07-11 DIAGNOSIS — N186 End stage renal disease: Secondary | ICD-10-CM

## 2017-07-11 DIAGNOSIS — I161 Hypertensive emergency: Secondary | ICD-10-CM

## 2017-07-11 DIAGNOSIS — Z992 Dependence on renal dialysis: Secondary | ICD-10-CM

## 2017-07-11 LAB — RENAL FUNCTION PANEL
ALBUMIN: 2.2 g/dL — AB (ref 3.5–5.0)
ANION GAP: 12 (ref 5–15)
BUN: 27 mg/dL — ABNORMAL HIGH (ref 6–20)
CO2: 24 mmol/L (ref 22–32)
Calcium: 8.3 mg/dL — ABNORMAL LOW (ref 8.9–10.3)
Chloride: 99 mmol/L — ABNORMAL LOW (ref 101–111)
Creatinine, Ser: 5.21 mg/dL — ABNORMAL HIGH (ref 0.61–1.24)
GFR calc non Af Amer: 12 mL/min — ABNORMAL LOW (ref >60)
GFR, EST AFRICAN AMERICAN: 14 mL/min — AB (ref >60)
Glucose, Bld: 220 mg/dL — ABNORMAL HIGH (ref 65–99)
PHOSPHORUS: 4.6 mg/dL (ref 2.5–4.6)
POTASSIUM: 4.6 mmol/L (ref 3.5–5.1)
SODIUM: 135 mmol/L (ref 135–145)

## 2017-07-11 LAB — CBC
HEMATOCRIT: 25.3 % — AB (ref 39.0–52.0)
HEMOGLOBIN: 7.8 g/dL — AB (ref 13.0–17.0)
MCH: 25.8 pg — ABNORMAL LOW (ref 26.0–34.0)
MCHC: 30.8 g/dL (ref 30.0–36.0)
MCV: 83.8 fL (ref 78.0–100.0)
Platelets: 282 10*3/uL (ref 150–400)
RBC: 3.02 MIL/uL — AB (ref 4.22–5.81)
RDW: 15.5 % (ref 11.5–15.5)
WBC: 9.9 10*3/uL (ref 4.0–10.5)

## 2017-07-11 LAB — GLUCOSE, CAPILLARY
GLUCOSE-CAPILLARY: 218 mg/dL — AB (ref 65–99)
GLUCOSE-CAPILLARY: 202 mg/dL — AB (ref 65–99)
Glucose-Capillary: 146 mg/dL — ABNORMAL HIGH (ref 65–99)
Glucose-Capillary: 203 mg/dL — ABNORMAL HIGH (ref 65–99)
Glucose-Capillary: 201 mg/dL — ABNORMAL HIGH (ref 65–99)

## 2017-07-11 LAB — MRSA PCR SCREENING: MRSA by PCR: NEGATIVE

## 2017-07-11 MED ORDER — PRISMASOL BGK 4/2.5 32-4-2.5 MEQ/L IV SOLN
INTRAVENOUS | Status: DC
  Administered 2017-07-11 – 2017-07-13 (×6): via INTRAVENOUS_CENTRAL
  Filled 2017-07-11 (×7): qty 5000

## 2017-07-11 MED ORDER — PRISMASOL BGK 4/2.5 32-4-2.5 MEQ/L IV SOLN
INTRAVENOUS | Status: DC
  Administered 2017-07-11 – 2017-07-14 (×4): via INTRAVENOUS_CENTRAL
  Filled 2017-07-11 (×5): qty 5000

## 2017-07-11 MED ORDER — HEPARIN SODIUM (PORCINE) 1000 UNIT/ML DIALYSIS
1000.0000 [IU] | INTRAMUSCULAR | Status: DC | PRN
  Administered 2017-07-14: 3400 [IU] via INTRAVENOUS_CENTRAL
  Filled 2017-07-11: qty 4
  Filled 2017-07-11: qty 6
  Filled 2017-07-11: qty 1
  Filled 2017-07-11: qty 6

## 2017-07-11 MED ORDER — PRISMASOL BGK 4/2.5 32-4-2.5 MEQ/L IV SOLN
INTRAVENOUS | Status: DC
  Administered 2017-07-11 – 2017-07-14 (×18): via INTRAVENOUS_CENTRAL
  Filled 2017-07-11 (×21): qty 5000

## 2017-07-11 MED ORDER — CHLORHEXIDINE GLUCONATE CLOTH 2 % EX PADS
6.0000 | MEDICATED_PAD | Freq: Every day | CUTANEOUS | Status: DC
  Administered 2017-07-12 – 2017-07-14 (×3): 6 via TOPICAL

## 2017-07-11 MED ORDER — SODIUM CHLORIDE 0.9 % FOR CRRT
INTRAVENOUS_CENTRAL | Status: DC | PRN
  Filled 2017-07-11: qty 1000

## 2017-07-11 MED ORDER — SODIUM CHLORIDE 0.9 % IV SOLN: Freq: Once | INTRAVENOUS | Status: DC

## 2017-07-11 NOTE — Progress Notes (Signed)
[facility] Progress Note   42 y.o.malewith medical history significant ofCKD-V, hypertension, hyperlipidemia, diabetes mellitus, CAD, CABG, sCHF with EF 45%, obesity. He presented with dyspnea and leg edema, found to have acute on chronic kidney injury and hypertensive emergency.   Assessment/ Plan:   1.Progressive renal insufficiency Stage 5 Will need to start dialysis will need access placed Diabetic nephropathy -CLIP process - HD #3on 3/29.  - He has a massive amount of fluid still onboard; I recommended CVVHD for 2-3 days to remove a significant amount of fluid (he has at least 30 lbs of excessive fluid onboard) but now he is willing. Dr. Loleta Books working to get pt transferred to ICU setting to initiate CVVHD for UF.    2. Hypertension Diuresis with PO lasix for now + multiple anti-hypertensives. Removing the fluid will help with the hypertension. He currently has no IV access other than the RIJ TC. 3. AnemiaTsats low Start iron and esa 4. Access access placementappreciate DrChen placing the TC RIJ.    Subjective:   More stable walking now but still difficult with all the edema in the legs. Denies cough, f/c/n/v.  Mild dysnpea.   Objective:   BP (!) 195/85 (BP Location: Left Arm)   Pulse 69   Temp 98.2 F (36.8 C) (Oral)   Resp 20   Ht 5\' 7"  (1.702 m)   Wt 119.8 kg (264 lb 1.6 oz)   SpO2 96%   BMI 41.36 kg/m   Intake/Output Summary (Last 24 hours) at 07/11/2017 1125 Last data filed at 07/11/2017 1000 Gross per 24 hour  Intake 240 ml  Output 4401 ml  Net -4161 ml   Weight change: -7.505 kg (-16 lb 8.7 oz)  Physical Exam: NAD A&Ox3 Access: RIJ TC CVS- RRR RS- CTA ABD- BS present soft non-distended EXT-4 +edema     Imaging: No results found.  Labs: BMET Recent Labs  Lab 07/06/17 0002 07/07/17 0004 07/08/17 1722 07/09/17 1258 07/10/17 0500  NA 140 139 136 137 135  K 4.1 3.9 3.9 3.6 4.0  CL 104 106 104  102 100*  CO2 22 23 22 23 26   GLUCOSE 176* 184* 235* 201* 252*  BUN 69* 69* 73* 61* 42*  CREATININE 8.86* 9.22* 9.09* 8.52* 6.91*  CALCIUM 8.4* 8.3* 7.9* 7.7* 7.9*  PHOS  --   --  6.6* 5.9*  --    CBC Recent Labs  Lab 07/06/17 0002 07/08/17 1722 07/08/17 1815 07/09/17 1258 07/10/17 0500  WBC 9.1 10.5  --  9.2 9.9  HGB 8.5* 6.4* 6.7* 7.4* 7.2*  HCT 26.7* 20.0* 20.9* 22.4* 23.0*  MCV 82.4 83.7  --  82.7 83.6  PLT 290 218  --  233 244    Medications:    . amLODipine  10 mg Oral Daily  . aspirin EC  81 mg Oral Daily  . atorvastatin  20 mg Oral Q breakfast  . carvedilol  25 mg Oral BID WC  . cholecalciferol  1,000 Units Oral Daily  . cloNIDine  0.2 mg Oral TID  . darbepoetin (ARANESP) injection - DIALYSIS  60 mcg Intravenous Q Wed-HD  . furosemide  80 mg Oral Q8H  . heparin  5,000 Units Subcutaneous Q8H  . hydrALAZINE  50 mg Oral Q8H  . insulin aspart  0-5 Units Subcutaneous QHS  . insulin aspart  0-9 Units Subcutaneous TID WC  . insulin detemir  10 Units Subcutaneous Daily  . multivitamin with minerals  1 tablet Oral Daily  [name], MD 07/11/2017, 11:25 AM

## 2017-07-11 NOTE — Progress Notes (Addendum)
PROGRESS NOTE    [name]  YKD:[id number] DOB: 07/25/1975 DOA: 07/06/2017 PCP: Larene Beach, MD      Brief Narrative:  [name] is a 42 yo M with CKD V followed by [facility], T2DM, CAD s/p CABG, CHF EF 45% who presents with subacute dyspnea, swelling found to worsening renal failure, hypertensive emergency, CHF.  Started on nitroglycerin drip, nephrology consulted, dialysis access placed.     Assessment & Plan:  Severe range hypertension Acute on chronic diastolic CHF Coronary artery disease status post CABG Elevated troponin  Troponin elevated but flat, not ACS. Hypertension severe range.  Had been on nitroglycerin until IV infiltrated last night, now on orals only.   Previous reduced EF, now echocardiogram normal EF, grade 2 diastolic dysfunction, normal valves. -Continue hydralazine, amlodipine, carvedilol, clonidine, Lasix  -Stop nitroglycerin drip for now since we have no way to obtain IV access -Continue aspirin, statin, beta-blocker  -Continue strict I's and O's, daily weights  -Nephrology has recommended CVVH, and I agree, he is currently not diuresing adequately with Lasix+IHD. -Discussed with [facility], who have graciously agreed to accept patient  Lost IV access Nitro gtt infiltrated overnight, lost IV access.  IV team attempted x2, even with [place].  Also attemted midline, failed.  Left arm is being preserved for fistula.    Acute on chronic renal failure Chronic kidney disease stage V TDC placed by VVS  -Vein mapping, dialysis per nephrology  Diabetes Blood sugars elevated. -Continue Levemir -Continue SSI -Stop metformin    Anemia of chronic renal disease He does have a considerable drop in his hemoglobin since admission.  Transfused 1 unit so far this hospitalization.  Low concern for RP bleed.  No clinical melena/hematochezia. -Daily CBC -Transfuse 1 unit again today  OSA -CPAP at night      DVT prophylaxis: Heparin Code Status: FULL Family  Communication: Fiancee baseline MDM and disposition Plan:.  The patient presented initially with severe CHF, flare that had progressed to end-stage renal disease.  Now requires transfer to ICU for CVVH.       Consultants:   Nephrology  VVS  Procedures:   [facility] catheter placement 3/26  Echocardiogram 3/27  Antimicrobials:   None    Subjective: No change to dyspnea, no new chest pain, dizziness, fever, melena, abdominal pain, flank pain, chest pain.  Swelling is essentially unchanged.     Objective: Vitals:   07/11/17 0630 07/11/17 0700 07/11/17 0823 07/11/17 1120  BP: (!) 165/75  (!) 183/80 (!) 195/85  Pulse: 67  69   Resp: 19  20   Temp:   98.4 F (36.9 C) 98.2 F (36.8 C)  TempSrc:  Oral Oral Oral  SpO2:   98% 96%  Weight:      Height:        Intake/Output Summary (Last 24 hours) at 07/11/2017 1502 Last data filed at 07/11/2017 1333 Gross per 24 hour  Intake -  Output 4751 ml  Net -4751 ml   Filed Weights   07/09/17 1615 07/10/17 0124 07/11/17 0402  Weight: 124.1 kg (273 lb 9.5 oz) 124.8 kg (275 lb 1.6 oz) 119.8 kg (264 lb 1.6 oz)    Examination: General appearance: Lying flat in bed, interactive, oriented.    HEENT: Sclera are normal, conjunctive and lids and lashes are normal, nose is normal, OP is moist without lesions.    Skin: He has an old CABG scar, otherwise the skin is warm and dry without suspicious rashes lesions.  Anasarca.  Cardiac: No murmurs, lower extremity edema intense.    Respiratory: Respiratory effort even Abdomen: Abdomen is soft without tenderness, no HSM, no distention. MSK: No deformities or effusions. Neuro: Awake and alert.  EOMI, moves all extremities. Speech fluent.    Psych: Oriented to person place and time, affect and judgment and insight normal.      Data Reviewed: I have personally reviewed following labs and imaging studies:  CBC: Recent Labs  Lab 07/06/17 0002 07/08/17 1722 07/08/17 1815 07/09/17 1258  07/10/17 0500  WBC 9.1 10.5  --  9.2 9.9  HGB 8.5* 6.4* 6.7* 7.4* 7.2*  HCT 26.7* 20.0* 20.9* 22.4* 23.0*  MCV 82.4 83.7  --  82.7 83.6  PLT 290 218  --  233 408   Basic Metabolic Panel: Recent Labs  Lab 07/06/17 0002 07/07/17 0004 07/08/17 1722 07/09/17 1258 07/10/17 0500  NA 140 139 136 137 135  K 4.1 3.9 3.9 3.6 4.0  CL 104 106 104 102 100*  CO2 22 23 22 23 26   GLUCOSE 176* 184* 235* 201* 252*  BUN 69* 69* 73* 61* 42*  CREATININE 8.86* 9.22* 9.09* 8.52* 6.91*  CALCIUM 8.4* 8.3* 7.9* 7.7* 7.9*  PHOS  --   --  6.6* 5.9*  --    GFR: Estimated Creatinine Clearance: 17.3 mL/min (A) (by C-G formula based on SCr of 6.91 mg/dL (H)). Liver Function Tests: Recent Labs  Lab 07/08/17 1722 07/09/17 1258  ALBUMIN 2.2* 1.8*   No results for input(s): LIPASE, AMYLASE in the last 168 hours. No results for input(s): AMMONIA in the last 168 hours. Coagulation Profile: No results for input(s): INR, PROTIME in the last 168 hours. Cardiac Enzymes: Recent Labs  Lab 07/06/17 0236 07/06/17 0935  TROPONINI 0.07* 0.07*   BNP (last 3 results) No results for input(s): PROBNP in the last 8760 hours. HbA1C: No results for input(s): HGBA1C in the last 72 hours. CBG: Recent Labs  Lab 07/10/17 0618 07/10/17 1117 07/10/17 2109 07/11/17 0615 07/11/17 1118  GLUCAP 234* 232* 166* 146* 218*   Lipid Profile: No results for input(s): CHOL, HDL, LDLCALC, TRIG, CHOLHDL, LDLDIRECT in the last 72 hours. Thyroid Function Tests: No results for input(s): TSH, T4TOTAL, FREET4, T3FREE, THYROIDAB in the last 72 hours. Anemia Panel: No results for input(s): VITAMINB12, FOLATE, FERRITIN, TIBC, IRON, RETICCTPCT in the last 72 hours. Urine analysis:    Component Value Date/Time   COLORURINE STRAW (A) 07/06/2017 1006   APPEARANCEUR CLEAR 07/06/2017 1006   LABSPEC 1.010 07/06/2017 1006   PHURINE 5.0 07/06/2017 1006   GLUCOSEU 50 (A) 07/06/2017 1006   HGBUR NEGATIVE 07/06/2017 1006   BILIRUBINUR  NEGATIVE 07/06/2017 1006   BILIRUBINUR negative 08/18/2016 1702   KETONESUR NEGATIVE 07/06/2017 1006   PROTEINUR >=300 (A) 07/06/2017 1006   UROBILINOGEN 0.2 08/18/2016 1702   UROBILINOGEN 1.0 12/23/2008 0539   NITRITE NEGATIVE 07/06/2017 1006   LEUKOCYTESUR NEGATIVE 07/06/2017 1006   Sepsis Labs: @LABRCNTIP (procalcitonin:4,lacticacidven:4)  ) Recent Results (from the past 240 hour(s))  Surgical pcr screen     Status: None   Collection Time: 07/06/17 11:16 PM  Result Value Ref Range Status   MRSA, PCR NEGATIVE NEGATIVE Final   Staphylococcus aureus NEGATIVE NEGATIVE Final    Comment: (NOTE) The Xpert SA Assay (FDA approved for NASAL specimens in patients 84 years of age and older), is one component of a comprehensive surveillance program. It is not intended to diagnose infection nor to guide or monitor treatment. Performed at [facility],  [address], [place], [place] [address]          Radiology Studies: No results found.      Scheduled Meds: . amLODipine  10 mg Oral Daily  . aspirin EC  81 mg Oral Daily  . atorvastatin  20 mg Oral Q breakfast  . carvedilol  25 mg Oral BID WC  . cholecalciferol  1,000 Units Oral Daily  . cloNIDine  0.2 mg Oral TID  . darbepoetin (ARANESP) injection - DIALYSIS  60 mcg Intravenous Q Wed-HD  . furosemide  80 mg Oral Q8H  . heparin  5,000 Units Subcutaneous Q8H  . hydrALAZINE  50 mg Oral Q8H  . insulin aspart  0-5 Units Subcutaneous QHS  . insulin aspart  0-9 Units Subcutaneous TID WC  . insulin detemir  10 Units Subcutaneous Daily  . multivitamin with minerals  1 tablet Oral Daily   Continuous Infusions: . ferric gluconate (FERRLECIT/NULECIT) IV Stopped (07/08/17 1942)  . nitroGLYCERIN Stopped (07/10/17 2106)     LOS: 5 days    Time spent: 20 minutes    Edwin Dada, MD [facility] 07/11/2017, 3:00 PM     Pager [phone] --- please page though AMION:  [email or web address]  7PM-7AM, please contact night-coverage

## 2017-07-11 NOTE — Consult Note (Signed)
PULMONARY / CRITICAL CARE MEDICINE   Name: [name] MRN: [id number] DOB: 07/09/1975    ADMISSION DATE:  07/06/2017 CONSULTATION DATE:  07/11/2017  REFERRING MD:  [name]  CHIEF COMPLAINT:  Dyspnea  HISTORY OF PRESENT ILLNESS:        This is a 42 year old diabetic known to have advanced renal disease who presented on 3/25 with dyspnea and edema.  He was not having any fevers chills sweats or cough.  He was not having any significant chest pain.  His troponin was very marginally elevated at 0.09 in the setting of end-stage renal disease.   He does have a prior history of CABG.  He was extremely hypertensive on presentation and deemed to be fluid overloaded.  A tunnel catheter was inserted and hemodialysis was initiated which he has received every day for the past 4 days however he remains hypertensive and very fluid overloaded.  Echocardiogram on this admission has shown normal systolic function with diastolic dysfunction.  At present he is breathing comfortably on room air but continues to have refractory edema.  He has  been unable to maintain a peripheral IV and is no longer receiving IV nitroglycerin for control of his hypertension and fluid overload.  Nephrology has requested transfer to the intensive care unit for CVVH to facilitate fluid removal.  The patient is agreeable. PAST MEDICAL HISTORY :  He  has a past medical history of CAD (coronary artery disease), Diabetes mellitus, Hyperlipidemia, and Hypertension.  PAST SURGICAL HISTORY: He  has a past surgical history that includes Coronary artery bypass graft; Coronary angioplasty (2010); and Insertion of dialysis catheter (N/A, 07/07/2017).  No Known Allergies  No current facility-administered medications on file prior to encounter.    Current Outpatient Medications on File Prior to Encounter  Medication Sig  . atorvastatin (LIPITOR) 20 MG tablet TAKE ONE TABLET BY MOUTH ONCE DAILY AT BEDTIME  . carvedilol (COREG) 25 MG tablet  TAKE 1 TABLET BY MOUTH TWICE DAILY  . cholecalciferol (VITAMIN D) 1000 UNITS tablet Take 1,000 Units by mouth daily.  . cloNIDine (CATAPRES) 0.1 MG tablet Take 0.1 mg by mouth 2 (two) times daily.  . fish oil-omega-3 fatty acids 1000 MG capsule Take 2 g by mouth daily.  Marland Kitchen glimepiride (AMARYL) 4 MG tablet TAKE ONE TABLET BY MOUTH TWICE DAILY BEFORE MEAL(S) (Patient taking differently: TAKE 2 MG BY MOUTH ONCE DAILY)  . Multiple Vitamin (MULTIVITAMIN) tablet Take 1 tablet by mouth daily.  . nitroGLYCERIN (NITROSTAT) 0.4 MG SL tablet DISSOLVE ONE TABLET UNDER THE TONGUE EVERY 5 MINUTES AS NEEDED FOR CHEST PAIN.  DO NOT EXCEED A TOTAL OF 3 DOSES IN 15 MINUTES  . torsemide (DEMADEX) 20 MG tablet Take 40 mg by mouth 2 (two) times daily.  . blood glucose meter kit and supplies KIT Dispense based on patient and insurance preference. Use QD-BID home glucose monitoring. (FOR ICD-9 250.00, 250.01).    FAMILY HISTORY:  His indicated that his mother is alive. He indicated that his father is deceased. He indicated that his sister is alive.   SOCIAL HISTORY: He  reports that he quit smoking about 8 years ago. He has never used smokeless tobacco. He reports that he drinks alcohol. He reports that he does not use drugs.  REVIEW OF SYSTEMS:   Noncontributory.  He has no focal neurological complaints, no dyspnea present, no cough, no chest pain.  SUBJECTIVE:  Above  VITAL SIGNS: BP (!) 195/85 (BP Location: Left Arm)   Pulse 69  Temp 98.2 F (36.8 C) (Oral)   Resp 20   Ht '5\' 7"'$  (1.702 m)   Wt 264 lb 1.6 oz (119.8 kg)   SpO2 96%   BMI 41.36 kg/m   HEMODYNAMICS:    VENTILATOR SETTINGS:    INTAKE / OUTPUT: I/O last 3 completed shifts: In: 911 [P.O.:480; I.V.:431] Out: 4650 [Urine:650; Other:4000]  PHYSICAL EXAMINATION: General: He is very alert and pleasantly interactive sitting up in a chair and in no overt distress Neuro: Oriented to person place date and circumstances.  It was equal and  moving all fours Cardiovascular: S1 and S2 are regular with a 2 out of 6 systolic ejection murmur.  There is no rub.  I do not detect any JVD he has 3+ lower extremity edema Lungs: Abrasions are unlabored with some scattered rales no wheezes.  There is good air movement throughout. Abdomen: Abdomen is somewhat obese and soft without any organomegaly masses or tenderness.  LABS:  BMET Recent Labs  Lab 07/08/17 1722 07/09/17 1258 07/10/17 0500  NA 136 137 135  K 3.9 3.6 4.0  CL 104 102 100*  CO2 '22 23 26  '$ BUN 73* 61* 42*  CREATININE 9.09* 8.52* 6.91*  GLUCOSE 235* 201* 252*    Electrolytes Recent Labs  Lab 07/08/17 1722 07/09/17 1258 07/10/17 0500  CALCIUM 7.9* 7.7* 7.9*  PHOS 6.6* 5.9*  --     CBC Recent Labs  Lab 07/08/17 1722 07/08/17 1815 07/09/17 1258 07/10/17 0500  WBC 10.5  --  9.2 9.9  HGB 6.4* 6.7* 7.4* 7.2*  HCT 20.0* 20.9* 22.4* 23.0*  PLT 218  --  233 244    Coag's No results for input(s): APTT, INR in the last 168 hours.  Sepsis Markers No results for input(s): LATICACIDVEN, PROCALCITON, O2SATVEN in the last 168 hours.  ABG No results for input(s): PHART, PCO2ART, PO2ART in the last 168 hours.  Liver Enzymes Recent Labs  Lab 07/08/17 1722 07/09/17 1258  ALBUMIN 2.2* 1.8*    Cardiac Enzymes Recent Labs  Lab 07/06/17 0236 07/06/17 0935  TROPONINI 0.07* 0.07*    Glucose Recent Labs  Lab 07/10/17 0040 07/10/17 0618 07/10/17 1117 07/10/17 2109 07/11/17 0615 07/11/17 1118  GLUCAP 227* 234* 232* 166* 146* 218*    Imaging No results found.     DISCUSSION:     This is a 42 year old diabetic with a history of coronary artery disease and CABG who presented with extreme hypertension, dyspnea and edema.  He was known to have advanced renal disease and hemodialysis was initiated for fluid removal on this admission.  He has been dialyzed every day for 4 days and still remains grossly fluid overloaded and hypertensive.  Request has  been made for an ICU bed in order to allow CVVH for more efficient fluid removal.  ASSESSMENT / PLAN:  PULMONARY A: Reviewed his films he does not appear to have any concurrent illness accounting for dyspnea on presentation other than his fluid overload and hypertension.  CARDIOVASCULAR A: No active chest pain with a essentially insignificant rise in troponin in a patient with end-stage renal disease.  I note he is only on 20 mg of Lipitor for secondary prevention I have ordered LFTs for the morning to see if we can adjust that dose.  RENAL A: Transferring to the ICU for CVVH as noted    ENDOCRINE A: He has fair glycemic control on the current regimen which will not be altered  Lars Masson, MD [facility] Pager: [phone]  07/11/2017, 4:06 PM

## 2017-07-12 LAB — CBC
HCT: 25.4 % — ABNORMAL LOW (ref 39.0–52.0)
Hemoglobin: 8 g/dL — ABNORMAL LOW (ref 13.0–17.0)
MCH: 26.7 pg (ref 26.0–34.0)
MCHC: 31.5 g/dL (ref 30.0–36.0)
MCV: 84.7 fL (ref 78.0–100.0)
Platelets: 262 10*3/uL (ref 150–400)
RBC: 3 MIL/uL — AB (ref 4.22–5.81)
RDW: 15.7 % — ABNORMAL HIGH (ref 11.5–15.5)
WBC: 9.4 10*3/uL (ref 4.0–10.5)

## 2017-07-12 LAB — RENAL FUNCTION PANEL
ALBUMIN: 2.2 g/dL — AB (ref 3.5–5.0)
ANION GAP: 11 (ref 5–15)
BUN: 19 mg/dL (ref 6–20)
CO2: 24 mmol/L (ref 22–32)
Calcium: 8.5 mg/dL — ABNORMAL LOW (ref 8.9–10.3)
Chloride: 100 mmol/L — ABNORMAL LOW (ref 101–111)
Creatinine, Ser: 3.68 mg/dL — ABNORMAL HIGH (ref 0.61–1.24)
GFR calc Af Amer: 22 mL/min — ABNORMAL LOW (ref >60)
GFR calc non Af Amer: 19 mL/min — ABNORMAL LOW (ref >60)
GLUCOSE: 220 mg/dL — AB (ref 65–99)
PHOSPHORUS: 3.2 mg/dL (ref 2.5–4.6)
POTASSIUM: 4.6 mmol/L (ref 3.5–5.1)
Sodium: 135 mmol/L (ref 135–145)

## 2017-07-12 LAB — COMPREHENSIVE METABOLIC PANEL
ALK PHOS: 74 U/L (ref 38–126)
ALT: 14 U/L — AB (ref 17–63)
AST: 16 U/L (ref 15–41)
Albumin: 2.3 g/dL — ABNORMAL LOW (ref 3.5–5.0)
Anion gap: 8 (ref 5–15)
BUN: 22 mg/dL — AB (ref 6–20)
CO2: 27 mmol/L (ref 22–32)
CREATININE: 4.56 mg/dL — AB (ref 0.61–1.24)
Calcium: 8.4 mg/dL — ABNORMAL LOW (ref 8.9–10.3)
Chloride: 103 mmol/L (ref 101–111)
GFR, EST AFRICAN AMERICAN: 17 mL/min — AB (ref >60)
GFR, EST NON AFRICAN AMERICAN: 15 mL/min — AB (ref >60)
Glucose, Bld: 118 mg/dL — ABNORMAL HIGH (ref 65–99)
Potassium: 3.9 mmol/L (ref 3.5–5.1)
Sodium: 138 mmol/L (ref 135–145)
Total Bilirubin: 0.4 mg/dL (ref 0.3–1.2)
Total Protein: 5.7 g/dL — ABNORMAL LOW (ref 6.5–8.1)

## 2017-07-12 LAB — POCT ACTIVATED CLOTTING TIME
ACTIVATED CLOTTING TIME: 147 s
ACTIVATED CLOTTING TIME: 153 s
ACTIVATED CLOTTING TIME: 153 s
ACTIVATED CLOTTING TIME: 158 s
Activated Clotting Time: 153 seconds
Activated Clotting Time: 158 seconds
Activated Clotting Time: 158 seconds
Activated Clotting Time: 153 seconds
Activated Clotting Time: 153 seconds
Activated Clotting Time: 153 seconds
Activated Clotting Time: 158 seconds
Activated Clotting Time: 164 seconds
Activated Clotting Time: 164 seconds

## 2017-07-12 LAB — PHOSPHORUS: Phosphorus: 3.7 mg/dL (ref 2.5–4.6)

## 2017-07-12 LAB — GLUCOSE, CAPILLARY
GLUCOSE-CAPILLARY: 147 mg/dL — AB (ref 65–99)
Glucose-Capillary: 120 mg/dL — ABNORMAL HIGH (ref 65–99)
Glucose-Capillary: 195 mg/dL — ABNORMAL HIGH (ref 65–99)
Glucose-Capillary: 289 mg/dL — ABNORMAL HIGH (ref 65–99)

## 2017-07-12 LAB — PREPARE RBC (CROSSMATCH)

## 2017-07-12 LAB — MAGNESIUM: Magnesium: 2.1 mg/dL (ref 1.7–2.4)

## 2017-07-12 MED ORDER — SODIUM CHLORIDE 0.9 % IJ SOLN
250.0000 [IU]/h | INTRAMUSCULAR | Status: DC
  Administered 2017-07-12: 1450 [IU]/h via INTRAVENOUS_CENTRAL
  Administered 2017-07-12: 250 [IU]/h via INTRAVENOUS_CENTRAL
  Administered 2017-07-13: 1650 [IU]/h via INTRAVENOUS_CENTRAL
  Administered 2017-07-13: 1750 [IU]/h via INTRAVENOUS_CENTRAL
  Administered 2017-07-13: 1700 [IU]/h via INTRAVENOUS_CENTRAL
  Administered 2017-07-13 (×2): 1750 [IU]/h via INTRAVENOUS_CENTRAL
  Administered 2017-07-14: 1650 [IU]/h via INTRAVENOUS_CENTRAL
  Filled 2017-07-12 (×7): qty 2

## 2017-07-12 MED ORDER — HEPARIN BOLUS VIA INFUSION (CRRT)
1000.0000 [IU] | INTRAVENOUS | Status: DC | PRN
  Administered 2017-07-12: 1000 [IU] via INTRAVENOUS_CENTRAL
  Filled 2017-07-12: qty 1000

## 2017-07-12 NOTE — Progress Notes (Signed)
[facility] Progress Note   42 y.o.malewith medical history significant ofCKD-V, hypertension, hyperlipidemia, diabetes mellitus, CAD, CABG, sCHF with EF 45%, obesity. He presented with dyspnea and leg edema, found to have acute on chronic kidney injury and hypertensive emergency.   Assessment/ Plan:   1.Progressive renal insufficiency Stage 5 Will need to start dialysis will need access placed Diabetic nephropathy -CLIP process - HD #3on 3/29.  - He has a massive amount of fluid still onboard; I recommended CVVHD for 2-3 days to remove a significant amount of fluid (he has at least 30 lbs of excessive fluid onboard).  CRRT started evening of 3/30  Seen on CRRT 4k baths Pre/post/dialysate 500/300/1500 UF - tolerating 347ml/hr -> increase to 350 Add heparin prefilter as well (filter already clotted off once last night)   2. Hypertension - Multiple anti-hypertensives. Removing the fluid will help with the hypertension.  3. AnemiaTsats low Start iron and esa 4. Access access placementappreciate DrChen placing the TC RIJ.    Subjective:   Denies cough, f/c/n/v.  Mild dysnpea.   Objective:   BP (!) 188/81 (BP Location: Right Arm)   Pulse 66   Temp 97.9 F (36.6 C) (Oral)   Resp 16   Ht 5\' 7"  (1.702 m)   Wt 117.9 kg (259 lb 14.8 oz)   SpO2 95%   BMI 40.71 kg/m   Intake/Output Summary (Last 24 hours) at 07/12/2017 0858 Last data filed at 07/12/2017 0800 Gross per 24 hour  Intake 180 ml  Output 3889 ml  Net -3709 ml   Weight change: -1.895 kg (-4 lb 2.8 oz)  Physical Exam: NAD A&Ox3 Access: RIJ TC CVS- RRR RS- CTA ABD- BS present soft non-distended EXT-3-4 +edema    Imaging: No results found.  Labs: BMET Recent Labs  Lab 07/06/17 0002 07/07/17 0004 07/08/17 1722 07/09/17 1258 07/10/17 0500 07/11/17 1937 07/12/17 0428  NA 140 139 136 137 135 135 138  K 4.1 3.9 3.9 3.6 4.0 4.6 3.9  CL 104 106 104 102 100*  99* 103  CO2 22 23 22 23 26 24 27   GLUCOSE 176* 184* 235* 201* 252* 220* 118*  BUN 69* 69* 73* 61* 42* 27* 22*  CREATININE 8.86* 9.22* 9.09* 8.52* 6.91* 5.21* 4.56*  CALCIUM 8.4* 8.3* 7.9* 7.7* 7.9* 8.3* 8.4*  PHOS  --   --  6.6* 5.9*  --  4.6 3.7   CBC Recent Labs  Lab 07/09/17 1258 07/10/17 0500 07/11/17 1937 07/12/17 0428  WBC 9.2 9.9 9.9 9.4  HGB 7.4* 7.2* 7.8* 8.0*  HCT 22.4* 23.0* 25.3* 25.4*  MCV 82.7 83.6 83.8 84.7  PLT 233 244 282 262    Medications:    . amLODipine  10 mg Oral Daily  . aspirin EC  81 mg Oral Daily  . atorvastatin  20 mg Oral Q breakfast  . carvedilol  25 mg Oral BID WC  . Chlorhexidine Gluconate Cloth  6 each Topical Daily  . cholecalciferol  1,000 Units Oral Daily  . cloNIDine  0.2 mg Oral TID  . darbepoetin (ARANESP) injection - DIALYSIS  60 mcg Intravenous Q Wed-HD  . furosemide  80 mg Oral Q8H  . heparin  5,000 Units Subcutaneous Q8H  . hydrALAZINE  50 mg Oral Q8H  . insulin aspart  0-5 Units Subcutaneous QHS  . insulin aspart  0-9 Units Subcutaneous TID WC  . insulin detemir  10 Units Subcutaneous Daily  . multivitamin with minerals  1 tablet Oral Daily  [name], MD 07/12/2017, 8:58 AM

## 2017-07-12 NOTE — Progress Notes (Signed)
Attempted to place pt on CPAP @2300 . Pt stated he was not ready that he would tell RN when he wanted to go on. Pt did not call this RT to place him on CPAP tonight.

## 2017-07-12 NOTE — Consult Note (Signed)
PULMONARY / CRITICAL CARE MEDICINE   Name: [name] MRN: [id number] DOB: [birth year]    ADMISSION DATE:  07/06/2017 CONSULTATION DATE:  07/11/2017 LOS 6 days  REFERRING MD:  Jackqulyn Livings  CHIEF COMPLAINT:  Dyspnea  BRIEF       This is a 42 year old diabetic known to have advanced renal disease who presented on 3/25 with dyspnea and edema.  He was not having any fevers chills sweats or cough.  He was not having any significant chest pain.  His troponin was very marginally elevated at 0.09 in the setting of end-stage renal disease.   He does have a prior history of CABG.  He was extremely hypertensive on presentation and deemed to be fluid overloaded.  A tunnel catheter was inserted and hemodialysis was initiated which he has received every day for the past 4 days however he remains hypertensive and very fluid overloaded.  Echocardiogram on this admission has shown normal systolic function with diastolic dysfunction.  At present he is breathing comfortably on room air but continues to have refractory edema.  He has  been unable to maintain a peripheral IV and is no longer receiving IV nitroglycerin for control of his hypertension and fluid overload.  Nephrology has requested transfer to the intensive care unit for CVVH to facilitate fluid removal.  The patient is agreeable.   EVENT 07/06/2017 - admit 07/11/17 - tx to ICU for CRRT  SUBJECTIVE/OVERNIGHT/INTERVAL HX 07/12/17 - on CRRT since last night. Per RN removing 300cc /h and tolerating well. SBP > 160 atleast. Per fiance - CRRT opted due to need to remove volume quickly   VITAL SIGNS: BP (!) 167/70   Pulse 66   Temp 97.9 F (36.6 C) (Oral)   Resp 17   Ht 5\' 7"  (1.702 m)   Wt 117.9 kg (259 lb 14.8 oz)   SpO2 94%   BMI 40.71 kg/m   HEMODYNAMICS:    VENTILATOR SETTINGS:    INTAKE / OUTPUT: I/O last 3 completed shifts: In: 180 [P.O.:180] Out: 4062 [Urine:1575; TKWIO:9735; Stool:1]  PHYSICAL EXAMINATION:  General  Appearance:    Looks well. 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  Pressors - no  Abdomen:     Soft, no masses, no organomegaly  Genitalia:    Not done  Rectal:   not done  Extremities:   Extremities- +++ edema     Skin:   Intact in exposed areas .      Neurologic:   Sedation - none -> RASS - 0 . Moves all 4s - yes. CAM-ICU - neg . Orientation - x3+     PULMONARY No results for input(s): PHART, PCO2ART, PO2ART, HCO3, TCO2, O2SAT in the last 168 hours.  Invalid input(s): PCO2, PO2  CBC Recent Labs  Lab 07/10/17 0500 07/11/17 1937 07/12/17 0428  HGB 7.2* 7.8* 8.0*  HCT 23.0* 25.3*  25.4*  WBC 9.9 9.9 9.4  PLT 244 282 262    COAGULATION No results for input(s): INR in the last 168 hours.  CARDIAC   Recent Labs  Lab 07/06/17 0236 07/06/17 0935  TROPONINI 0.07* 0.07*   No results for input(s): PROBNP in the last 168 hours.   CHEMISTRY Recent Labs  Lab 07/08/17 1722 07/09/17 1258 07/10/17 0500 07/11/17 1937 07/12/17 0428  NA 136 137 135 135 138  K 3.9 3.6 4.0 4.6 3.9  CL 104 102 100* 99* 103  CO2 22 23 26 24 27   GLUCOSE 235* 201* 252* 220* 118*  BUN 73* 61* 42* 27* 22*  CREATININE 9.09* 8.52* 6.91* 5.21* 4.56*  CALCIUM 7.9* 7.7* 7.9* 8.3* 8.4*  MG  --   --   --   --  2.1  PHOS 6.6* 5.9*  --  4.6 3.7   Estimated Creatinine Clearance: 25.9 mL/min (A) (by C-G formula based on SCr of 4.56 mg/dL (H)).   LIVER Recent Labs  Lab 07/08/17 1722 07/09/17 1258 07/11/17 1937 07/12/17 0428  AST  --   --   --  16  ALT  --   --    --  14*  ALKPHOS  --   --   --  74  BILITOT  --   --   --  0.4  PROT  --   --   --  5.7*  ALBUMIN 2.2* 1.8* 2.2* 2.3*     INFECTIOUS No results for input(s): LATICACIDVEN, PROCALCITON in the last 168 hours.   ENDOCRINE CBG (last 3)  Recent Labs    07/11/17 1814 07/11/17 2130 07/12/17 0722  GLUCAP 203* 201* 120*         IMAGING x48h  - image(s) personally visualized  -   highlighted in bold No results found.    DISCUSSION:     This is a 42 year old diabetic with a history of coronary artery disease and CABG who presented with extreme hypertension, dyspnea and edema.  He was known to have advanced renal disease and hemodialysis was initiated for fluid removal on this admission.  He has been dialyzed every day for 4 days and still remains grossly fluid overloaded and hypertensive.  Request has been made for an ICU bed in order to allow CVVH for more efficient fluid removal.  ASSESSMENT / PLAN:  PULMONARY A: Volume overload but not at intubation risk  Plan Monitor o2 if needed   CARDIOVASCULAR A: No active chest pain with a essentially insignificant rise in troponin in a patient with end-stage renal disease.  P monitor  RENAL A: ESRD, volume overload, Failed HD  Plan CRRT per renal  ENDOCRINE A: He has fair glycemic control on the current regimen which will not be alter P SSI  HEME A: anemia of chronic disease P - PRBC for hgb </= 6.9gm%    - exceptions are   -  if ACS susepcted/confirmed then transfuse for hgb </= 8.0gm%,  or    -  active bleeding with hemodynamic instability, then transfuse regardless of hemoglobin value   At at all times try to transfuse 1 unit prbc as possible with exception of active hemorrhage  dvt proph with sq heparing     Dr. Brand Males, M.D., [facility].C.P Pulmonary and Critical Care Medicine Staff Physician [place] [facility] Pager: [phone], If no answer or between  15:00h  - 7:00h: call [phone]  07/12/2017 11:33  AM

## 2017-07-13 LAB — POCT ACTIVATED CLOTTING TIME
ACTIVATED CLOTTING TIME: 169 s
ACTIVATED CLOTTING TIME: 169 s
ACTIVATED CLOTTING TIME: 175 s
ACTIVATED CLOTTING TIME: 175 s
ACTIVATED CLOTTING TIME: 175 s
ACTIVATED CLOTTING TIME: 191 s
ACTIVATED CLOTTING TIME: 197 s
ACTIVATED CLOTTING TIME: 208 s
ACTIVATED CLOTTING TIME: 230 s
ACTIVATED CLOTTING TIME: 235 s
Activated Clotting Time: 180 seconds
Activated Clotting Time: 180 seconds
Activated Clotting Time: 186 seconds
Activated Clotting Time: 191 seconds
Activated Clotting Time: 197 seconds
Activated Clotting Time: 202 seconds
Activated Clotting Time: 208 seconds

## 2017-07-13 LAB — RENAL FUNCTION PANEL
ALBUMIN: 2.4 g/dL — AB (ref 3.5–5.0)
ANION GAP: 8 (ref 5–15)
Albumin: 2.3 g/dL — ABNORMAL LOW (ref 3.5–5.0)
Anion gap: 12 (ref 5–15)
BUN: 16 mg/dL (ref 6–20)
BUN: 14 mg/dL (ref 6–20)
CALCIUM: 8.3 mg/dL — AB (ref 8.9–10.3)
CHLORIDE: 102 mmol/L (ref 101–111)
CO2: 27 mmol/L (ref 22–32)
CO2: 23 mmol/L (ref 22–32)
Calcium: 8.7 mg/dL — ABNORMAL LOW (ref 8.9–10.3)
Chloride: 102 mmol/L (ref 101–111)
Creatinine, Ser: 3.25 mg/dL — ABNORMAL HIGH (ref 0.61–1.24)
Creatinine, Ser: 3.07 mg/dL — ABNORMAL HIGH (ref 0.61–1.24)
GFR calc Af Amer: 25 mL/min — ABNORMAL LOW (ref >60)
GFR calc Af Amer: 27 mL/min — ABNORMAL LOW (ref >60)
GFR calc non Af Amer: 22 mL/min — ABNORMAL LOW (ref >60)
GFR calc non Af Amer: 23 mL/min — ABNORMAL LOW (ref >60)
GLUCOSE: 177 mg/dL — AB (ref 65–99)
GLUCOSE: 270 mg/dL — AB (ref 65–99)
PHOSPHORUS: 2.9 mg/dL (ref 2.5–4.6)
POTASSIUM: 4.9 mmol/L (ref 3.5–5.1)
Phosphorus: 3.3 mg/dL (ref 2.5–4.6)
Potassium: 4.3 mmol/L (ref 3.5–5.1)
Sodium: 137 mmol/L (ref 135–145)
Sodium: 137 mmol/L (ref 135–145)

## 2017-07-13 LAB — CBC
HCT: 26.6 % — ABNORMAL LOW (ref 39.0–52.0)
HEMOGLOBIN: 8.3 g/dL — AB (ref 13.0–17.0)
MCH: 26.8 pg (ref 26.0–34.0)
MCHC: 31.2 g/dL (ref 30.0–36.0)
MCV: 85.8 fL (ref 78.0–100.0)
Platelets: 316 10*3/uL (ref 150–400)
RBC: 3.1 MIL/uL — ABNORMAL LOW (ref 4.22–5.81)
RDW: 15.9 % — ABNORMAL HIGH (ref 11.5–15.5)
WBC: 11.5 10*3/uL — ABNORMAL HIGH (ref 4.0–10.5)

## 2017-07-13 LAB — GLUCOSE, CAPILLARY
GLUCOSE-CAPILLARY: 210 mg/dL — AB (ref 65–99)
GLUCOSE-CAPILLARY: 196 mg/dL — AB (ref 65–99)
GLUCOSE-CAPILLARY: 270 mg/dL — AB (ref 65–99)
Glucose-Capillary: 162 mg/dL — ABNORMAL HIGH (ref 65–99)
Glucose-Capillary: 240 mg/dL — ABNORMAL HIGH (ref 65–99)

## 2017-07-13 LAB — APTT: aPTT: 171 seconds (ref 24–36)

## 2017-07-13 LAB — MAGNESIUM: Magnesium: 2.4 mg/dL (ref 1.7–2.4)

## 2017-07-13 MED ORDER — INSULIN DETEMIR 100 UNIT/ML ~~LOC~~ SOLN
12.0000 [IU] | Freq: Every day | SUBCUTANEOUS | Status: DC
  Administered 2017-07-13 – 2017-07-16 (×4): 12 [IU] via SUBCUTANEOUS
  Filled 2017-07-13 (×4): qty 0.12

## 2017-07-13 NOTE — Progress Notes (Signed)
Patient ID: [name], male   DOB: [birth year], 42 y.o.   MRN: [id number] Events noted.  Patient transferred to critical care unit for CVVHD.  Following along.  Will eventually need permanent access once stabilized.

## 2017-07-13 NOTE — Progress Notes (Signed)
  [facility] Progress Note    Subjective:   Feels better and wants to go home   Objective:   BP 123/67   Pulse 66   Temp 98.1 F (36.7 C) (Oral)   Resp 16   Ht 5\' 7"  (1.702 m)   Wt 108.7 kg (239 lb 10.2 oz)   SpO2 96%   BMI 37.53 kg/m   Intake/Output: I/O last 3 completed shifts: In: 480 [P.O.:480] Out: 12671 [Urine:1725; ZOXWR:60454]   Intake/Output this shift:  Total I/O In: 160 [P.O.:160] Out: 1594 [Other:1594] Weight change: -9.2 kg (-20 lb 4.5 oz)  Physical Exam: Gen: NAD CVS: no rub Resp: CTA  UJW:[id number] Ext: 2+ edema lower extremities  Labs: BMET Recent Labs  Lab 07/08/17 1722 07/09/17 1258 07/10/17 0500 07/11/17 1937 07/12/17 0428 07/12/17 1644 07/13/17 0430  NA 136 137 135 135 138 135 137  K 3.9 3.6 4.0 4.6 3.9 4.6 4.3  CL 104 102 100* 99* 103 100* 102  CO2 22 23 26 24 27 24 27   GLUCOSE 235* 201* 252* 220* 118* 220* 177*  BUN 73* 61* 42* 27* 22* 19 16  CREATININE 9.09* 8.52* 6.91* 5.21* 4.56* 3.68* 3.25*  ALBUMIN 2.2* 1.8*  --  2.2* 2.3* 2.2* 2.3*  CALCIUM 7.9* 7.7* 7.9* 8.3* 8.4* 8.5* 8.3*  PHOS 6.6* 5.9*  --  4.6 3.7 3.2 2.9   CBC Recent Labs  Lab 07/10/17 0500 07/11/17 1937 07/12/17 0428 07/13/17 0430  WBC 9.9 9.9 9.4 11.5*  HGB 7.2* 7.8* 8.0* 8.3*  HCT 23.0* 25.3* 25.4* 26.6*  MCV 83.6 83.8 84.7 85.8  PLT 244 282 262 316    @IMGRELPRIORS @ Medications:    . amLODipine  10 mg Oral Daily  . aspirin EC  81 mg Oral Daily  . atorvastatin  20 mg Oral Q breakfast  . carvedilol  25 mg Oral BID WC  . Chlorhexidine Gluconate Cloth  6 each Topical Daily  . cholecalciferol  1,000 Units Oral Daily  . cloNIDine  0.2 mg Oral TID  . darbepoetin (ARANESP) injection - DIALYSIS  60 mcg Intravenous Q Wed-HD  . furosemide  80 mg Oral Q8H  . heparin  5,000 Units Subcutaneous Q8H  . hydrALAZINE  50 mg Oral Q8H  . insulin aspart  0-5 Units Subcutaneous QHS  . insulin aspart  0-9 Units Subcutaneous TID WC  . insulin detemir   12 Units Subcutaneous Daily  . multivitamin with minerals  1 tablet Oral Daily     Assessment/ Plan:   1. Acute on chronic systolic CHF- he is now negative 19.5kg.  Tolerating CVVHD well and will stop in am  2. ESRD- new, accepted to [facility] on MWF.   3. Hypertensive urgency- improved 4. Anemia: ESA and follow 5. 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 Cont to follow 6. Nutrition: renal diet 7. Hypertension: markedly improved with UF and bp meds 8. Vascular access- has tunneled HD catheter but will need AVF/AVG per VVS 9. Disposition- will be able to come out of ICU tomorrow and arrange for permanent access placement.  Donetta Potts, MD [place] Pager [phone] 07/13/2017, 11:49 AM

## 2017-07-13 NOTE — Progress Notes (Signed)
PULMONARY  / CRITICAL CARE MEDICINE  Name: [name] MRN: [id number] DOB: 08-14-1975    LOS: 48  REFERRING MD :  Jackqulyn Livings  CHIEF COMPLAINT:  Dyspnea  BRIEF PATIENT DESCRIPTION: 42 y.o male with DM and CAD s/p CABG who presented to the ED with dyspnea, hypertension, and LE edema. He was found to have worsening renal function and given his volume overload HD was initiated after placing a tunneled HD catheter. After 4 days of HD his HTN and volume overload failed to significantly improve. He was subsequently transferred to the ICU for CRRT.   LINES / TUBES: Right side tunneled HD catheter   CULTURES: No culture data  ANTIBIOTICS: No antibiotics   INTERVAL HISTORY:  Patient doing well over the interval. Feels his breathing has significantly improved.   Continues to be on CRRT with removal of approximately 300-400cc per hour.   BP significantly improved and patient is now normotensive.  VITAL SIGNS: Temp:  [97.7 F (36.5 C)-98.9 F (37.2 C)] 98.2 F (36.8 C) (04/01 0400) Pulse Rate:  [58-73] 66 (04/01 0700) Resp:  [9-18] 16 (04/01 0700) BP: (132-193)/(69-86) 132/70 (04/01 0700) SpO2:  [94 %-100 %] 96 % (04/01 0700) Weight:  [239 lb 10.2 oz (108.7 kg)] 239 lb 10.2 oz (108.7 kg) (04/01 0440)  INTAKE / OUTPUT: Intake/Output      03/31 0701 - 04/01 0700 04/01 0701 - 04/02 0700   P.O. 300    Total Intake(mL/kg) 300 (2.8)    Urine (mL/kg/hr) 1100 (0.4)    Other 8460    Stool     Total Output 9560    Net -9260          PHYSICAL EXAMINATION: General: Well nourished male in no acute distress Neuro: Alert and oriented x 3 HEENT: Normocephalic, atraumatic, moist mucus membranes  Cardiovascular: RRR, no murmurs, no rubs  Lungs: Good air movement with no wheezing or crackles Abdomen: Active bowel sounds, soft, no tenderness to palpation  Musculoskeletal: 1+ pitting edema of the bilateral LE at the mid-shin, no sacral edema, radial pulses palpable bilaterally  Skin:  Warm and dry   LABS: Cbc Recent Labs  Lab 07/11/17 1937 07/12/17 0428 07/13/17 0430  WBC 9.9 9.4 11.5*  HGB 7.8* 8.0* 8.3*  HCT 25.3* 25.4* 26.6*  PLT 282 262 316   Chemistry Recent Labs  Lab 07/12/17 0428 07/12/17 1644 07/13/17 0430  NA 138 135 137  K 3.9 4.6 4.3  CL 103 100* 102  CO2 27 24 27   BUN 22* 19 16  CREATININE 4.56* 3.68* 3.25*  CALCIUM 8.4* 8.5* 8.3*  MG 2.1  --  2.4  PHOS 3.7 3.2 2.9  GLUCOSE 118* 220* 177*   Liver fxn Recent Labs  Lab 07/12/17 0428 07/12/17 1644 07/13/17 0430  AST 16  --   --   ALT 14*  --   --   ALKPHOS 74  --   --   BILITOT 0.4  --   --   PROT 5.7*  --   --   ALBUMIN 2.3* 2.2* 2.3*   coags Recent Labs  Lab 07/13/17 0430  APTT 171*   Sepsis markers No results for input(s): LATICACIDVEN, PROCALCITON in the last 168 hours. Cardiac markers Recent Labs  Lab 07/06/17 0935  TROPONINI 0.07*   BNP No results for input(s): PROBNP in the last 168 hours.   ABG No results for input(s): PHART, PCO2ART, PO2ART, HCO3, TCO2 in the last 168 hours.  CBG trend Recent Labs  Lab 07/11/17 2130 07/12/17 0722 07/12/17 1206 07/12/17 1613 07/12/17 2203  GLUCAP 201* 120* 147* 195* 25*   DIAGNOSES: Principal Problem:   Hypertensive emergency Active Problems:   Hypertension   HLD (hyperlipidemia)   CAD (coronary artery disease)   Type II diabetes mellitus with renal manifestations (HCC)   S/P CABG x 3   Acute on chronic systolic CHF (congestive heart failure) (HCC)   Anemia associated with chronic renal failure   Acute renal failure superimposed on stage 5 chronic kidney disease, not on chronic dialysis ([place])   Acute respiratory failure with hypoxia (HCC)  ASSESSMENT / PLAN:  42 y.o male who presented on 3/25 with signs and symptoms of volume overload and hypertensive emergency secondary to acute on chronic renal failure. Initially failed interval HD (attempted for 4 days) and now responding well to CRRT (initiated on  3/30).  RENAL Presented with acute on chronic renal failure (Cr 2.19 -> 8.86 -> 3.25).  Started on HD due to volume overload.  Failed interval HD and transitioned to CRRT on 3/30  Fluid status: net negative 9.2 L over the interval  Weight is down >35 lbs Hemodynamically stable   PLAN:   Volume status significantly improved.  Nephrology initiated CLIP process  PTT elevated at 171, will need to adjust heparin  Nephrology onboard, likely will transition off CRRT today or tomorrow   PULMONARY Bilateral pulmonary edema on admission CXR  Oxygen saturations >95% on RA History of OSA  PLAN:   CPAP qHS  CARDIOVASCULAR Initially presented with hypertensive emergency Unable to maintain nitro drip  Normotensive this AM  PLAN:  Likely volume driven as his HTN has improved with fluid removal  On Amlodipine 10 mg QD, Clonidine 0.2 mg BID, and furosemide 80 mg TID Would consider starting ACE-I given patient's cardiac history and CKD  HEMATOLOGIC Hgb stable at 8.3 with elevated RDW No chest pain, active bleeding, or hemodynamic instability   PLAN:  Iron deficiency anemia On Epo and iron given the patient Hgb and renal disease  INFECTIOUS No signs of acute infection.   PLAN:   Continue to monitor fever curve  ENDOCRINE Patient has Type 2 DM on Glipizide 4 mg q daily   PLAN:   CBGs for the most part are at goal of 140-180 but does have multiple readings >200 Currently on Levemir 10 units qHS and SSI-sensitive with evening coverage  No changes today but will continue to monitor   Pulmonary and [place] Pager: [phone]  07/13/2017, 7:06 AM

## 2017-07-13 NOTE — Progress Notes (Signed)
PULMONARY / CRITICAL CARE MEDICINE   Name: [name] MRN: [id number] DOB: 1975/05/21    ADMISSION DATE:  07/06/2017 CONSULTATION DATE:  07/11/2017 LOS 7 days  REFERRING MD:  Jackqulyn Livings  CHIEF COMPLAINT:  Dyspnea  BRIEF       This is a 42 year old diabetic known to have advanced renal disease who presented on 3/25 with dyspnea and edema.  He was not having any fevers chills sweats or cough.  He was not having any significant chest pain.  His troponin was very marginally elevated at 0.09 in the setting of end-stage renal disease.   He does have a prior history of CABG.  He was extremely hypertensive on presentation and deemed to be fluid overloaded.  A tunnel catheter was inserted and hemodialysis was initiated which he has received every day for the past 4 days however he remains hypertensive and very fluid overloaded.  Echocardiogram on this admission has shown normal systolic function with diastolic dysfunction.  At present he is breathing comfortably on room air but continues to have refractory edema.  He has  been unable to maintain a peripheral IV and is no longer receiving IV nitroglycerin for control of his hypertension and fluid overload.  Nephrology has requested transfer to the intensive care unit for CVVH to facilitate fluid removal.  The patient is agreeable.   EVENT 07/06/2017 - admit 07/11/17 - tx to ICU for CRRT  SUBJECTIVE/OVERNIGHT/INTERVAL HX 07/12/17 - on CRRT since last night. Per RN removing 300cc /h and tolerating well. SBP > 160 atleast. Per fiance - CRRT opted due to need to remove volume quickly   4/1 Stable overnight. Continues on CRRT.  VITAL SIGNS: BP (!) 156/71   Pulse 72   Temp 98.1 F (36.7 C) (Oral)   Resp 13   Ht 5\' 7"  (1.702 m)   Wt 239 lb 10.2 oz (108.7 kg)   SpO2 100%   BMI 37.53 kg/m   HEMODYNAMICS:    VENTILATOR SETTINGS:    INTAKE / OUTPUT: I/O last 3 completed shifts: In: 480 [P.O.:480] Out: 12671 [Urine:1725;  YBOFB:51025]  PHYSICAL EXAMINATION: Blood pressure (!) 156/71, pulse 72, temperature 98.1 F (36.7 C), temperature source Oral, resp. rate 13, height 5\' 7"  (1.702 m), weight 239 lb 10.2 oz (108.7 kg), SpO2 100 %. Gen:      No acute distress HEENT:  EOMI, sclera anicteric Neck:     No masses; no thyromegaly Lungs:    Clear to auscultation bilaterally; normal respiratory effort CV:         Regular rate and rhythm; no murmurs Abd:      + bowel sounds; soft, non-tender; no palpable masses, no distension Ext:    No edema; adequate peripheral perfusion Skin:      Warm and dry; no rash Neuro: alert and oriented x 3 Psych: normal mood and affect  PULMONARY No results for input(s): PHART, PCO2ART, PO2ART, HCO3, TCO2, O2SAT in the last 168 hours.  Invalid input(s): PCO2, PO2  CBC Recent Labs  Lab 07/11/17 1937 07/12/17 0428 07/13/17 0430  HGB 7.8* 8.0* 8.3*  HCT 25.3* 25.4* 26.6*  WBC 9.9 9.4 11.5*  PLT 282 262 316    COAGULATION No results for input(s): INR in the last 168 hours.  CARDIAC   Recent Labs  Lab 07/06/17 0935  TROPONINI 0.07*   No results for input(s): PROBNP in the last 168 hours.   CHEMISTRY Recent Labs  Lab 07/09/17 1258 07/10/17 0500 07/11/17 1937 07/12/17 0428 07/12/17  1644 07/13/17 0430  NA 137 135 135 138 135 137  K 3.6 4.0 4.6 3.9 4.6 4.3  CL 102 100* 99* 103 100* 102  CO2 23 26 24 27 24 27   GLUCOSE 201* 252* 220* 118* 220* 177*  BUN 61* 42* 27* 22* 19 16  CREATININE 8.52* 6.91* 5.21* 4.56* 3.68* 3.25*  CALCIUM 7.7* 7.9* 8.3* 8.4* 8.5* 8.3*  MG  --   --   --  2.1  --  2.4  PHOS 5.9*  --  4.6 3.7 3.2 2.9   Estimated Creatinine Clearance: 34.8 mL/min (A) (by C-G formula based on SCr of 3.25 mg/dL (H)).   LIVER Recent Labs  Lab 07/09/17 1258 07/11/17 1937 07/12/17 0428 07/12/17 1644 07/13/17 0430  AST  --   --  16  --   --   ALT  --   --  14*  --   --   ALKPHOS  --   --  74  --   --   BILITOT  --   --  0.4  --   --   PROT  --    --  5.7*  --   --   ALBUMIN 1.8* 2.2* 2.3* 2.2* 2.3*     INFECTIOUS No results for input(s): LATICACIDVEN, PROCALCITON in the last 168 hours.   ENDOCRINE CBG (last 3)  Recent Labs    07/12/17 1613 07/12/17 2203 07/13/17 0716  GLUCAP 195* 289* 162*    IMAGING x48h  - image(s) personally visualized  -   highlighted in bold No results found.    DISCUSSION:     This is a 42 year old diabetic with a history of coronary artery disease and CABG who presented with extreme hypertension, dyspnea and edema.  He was known to have advanced renal disease and hemodialysis was initiated for fluid removal on this admission.  He has been dialyzed every day for 4 days and still remains grossly fluid overloaded and hypertensive.  Request has been made for an ICU bed in order to allow CVVH for more efficient fluid removal.  ASSESSMENT / PLAN:  PULMONARY A: Volume overload but not at intubation risk OSA  Plan CPAP at night Supplemental O2 as needed  CARDIOVASCULAR A: HTN Elevated troponin- stable trend. Likely demand  P Continue amlodipine, coreg, lasix ASA  RENAL A: ESRD, volume overload, Failed HD  Plan On CRRT per nephrology  ENDOCRINE A: Hyperglycemia P SSI Increase levimir to 12  HEME A: anemia of chronic disease P Follow CBC. S/p 1 unit 3/27  BEST PRACTICE / DISPOSITION Level of Care:  ICU Primary Service:  PCCM Consultants: nephro Code Status:  Full Diet:  Full diet DVT Px:  Hep SQ GI Px:   Skin Integrity:  good Social / Family:   The patient is critically ill with multiple organ system failure and requires high complexity decision making for assessment and support, frequent evaluation and titration of therapies, advanced monitoring, review of radiographic studies and interpretation of complex data.   Critical Care Time devoted to patient care services, exclusive of separately billable procedures, described in this note is 35 minutes.   Marshell Garfinkel  MD [place] [facility] Pager [phone] If no answer or after 3pm call: [phone] 07/13/2017, 9:07 AM

## 2017-07-14 LAB — GLUCOSE, CAPILLARY
GLUCOSE-CAPILLARY: 227 mg/dL — AB (ref 65–99)
GLUCOSE-CAPILLARY: 287 mg/dL — AB (ref 65–99)
Glucose-Capillary: 166 mg/dL — ABNORMAL HIGH (ref 65–99)
Glucose-Capillary: 274 mg/dL — ABNORMAL HIGH (ref 65–99)

## 2017-07-14 LAB — RENAL FUNCTION PANEL
ALBUMIN: 2.6 g/dL — AB (ref 3.5–5.0)
Anion gap: 10 (ref 5–15)
BUN: 12 mg/dL (ref 6–20)
CHLORIDE: 101 mmol/L (ref 101–111)
CO2: 27 mmol/L (ref 22–32)
Calcium: 8.8 mg/dL — ABNORMAL LOW (ref 8.9–10.3)
Creatinine, Ser: 2.78 mg/dL — ABNORMAL HIGH (ref 0.61–1.24)
GFR, EST AFRICAN AMERICAN: 31 mL/min — AB (ref >60)
GFR, EST NON AFRICAN AMERICAN: 26 mL/min — AB (ref >60)
Glucose, Bld: 156 mg/dL — ABNORMAL HIGH (ref 65–99)
PHOSPHORUS: 3.1 mg/dL (ref 2.5–4.6)
POTASSIUM: 4.4 mmol/L (ref 3.5–5.1)
Sodium: 138 mmol/L (ref 135–145)

## 2017-07-14 LAB — POCT ACTIVATED CLOTTING TIME
ACTIVATED CLOTTING TIME: 219 s
ACTIVATED CLOTTING TIME: 230 s
ACTIVATED CLOTTING TIME: 213 s
Activated Clotting Time: 219 seconds

## 2017-07-14 LAB — MAGNESIUM: MAGNESIUM: 2.5 mg/dL — AB (ref 1.7–2.4)

## 2017-07-14 LAB — APTT: aPTT: >200 seconds (ref 24–36)

## 2017-07-14 MED ORDER — INSULIN ASPART 100 UNIT/ML ~~LOC~~ SOLN
2.0000 [IU] | Freq: Three times a day (TID) | SUBCUTANEOUS | Status: DC
  Administered 2017-07-14 – 2017-07-16 (×5): 2 [IU] via SUBCUTANEOUS

## 2017-07-14 NOTE — Progress Notes (Signed)
Transferred from 2 M01,placed in med-sur bed.Awake,alert and oriented  X 4 .No skin issue as per assessed with Ulice Dash .R.N.

## 2017-07-14 NOTE — Progress Notes (Addendum)
PULMONARY  / CRITICAL CARE MEDICINE  Name: [name] MRN: [id number] DOB: 02-05-1976    LOS: 59  REFERRING MD :  Jackqulyn Livings  CHIEF COMPLAINT:  Dyspnea  BRIEF PATIENT DESCRIPTION: 42 y.o male with DM and CAD s/p CABG who presented to the ED with dyspnea, hypertension, and LE edema. He was found to have worsening renal function and given his volume overload HD was initiated after placing a tunneled HD catheter. After 4 days of HD his HTN and volume overload failed to significantly improve. He was subsequently transferred to the ICU for CRRT.   LINES / TUBES: Right side tunneled HD catheter   CULTURES: No culture data  ANTIBIOTICS: No antibiotics   INTERVAL HISTORY:  Patient feeling well this AM. His breathing has improved significantly. Denies cramping, chest pressure, SOB, cough, abdominal pain, diarrhea. Nursing reports no overnight events.   Continuing on CRRT with removal of approximately 300-400cc per hour. Nephrology is planning to discontinue CRRT this AM. Patient is set up for outpatient HD at [facility] on MWF.   VITAL SIGNS: Temp:  [97.8 F (36.6 C)-98.4 F (36.9 C)] 97.8 F (36.6 C) (04/02 0254) Pulse Rate:  [58-73] 63 (04/02 0700) Resp:  [8-18] 10 (04/02 0700) BP: (121-174)/(59-82) 135/74 (04/02 0700) SpO2:  [95 %-100 %] 100 % (04/02 0700) Weight:  [221 lb 9 oz (100.5 kg)] 221 lb 9 oz (100.5 kg) (04/02 0400)  INTAKE / OUTPUT: Intake/Output      04/01 0701 - 04/02 0700 04/02 0701 - 04/03 0700   P.O. 550    Total Intake(mL/kg) 550 (5.5)    Urine (mL/kg/hr)     Other 9173    Total Output 9173    Net -8623          PHYSICAL EXAMINATION:  General: Obese male in no acute distress Neuro: Alert and oriented x 3 HEENT: Normocephalic, atraumatic, moist mucus membranes  Cardiovascular: RRR, no murmurs, no rubs  Lungs: Good air movement with no wheezing or crackles Abdomen: Active bowel sounds, soft, no tenderness to palpation  Musculoskeletal: 1+ pitting edema of  the bilateral LE at the mid-shin that is improved from the day prior  Skin: Warm and dry   LABS: Cbc Recent Labs  Lab 07/11/17 1937 07/12/17 0428 07/13/17 0430  WBC 9.9 9.4 11.5*  HGB 7.8* 8.0* 8.3*  HCT 25.3* 25.4* 26.6*  PLT 282 262 316   Chemistry Recent Labs  Lab 07/12/17 0428  07/13/17 0430 07/13/17 1603 07/14/17 0509  NA 138   < > 137 137 138  K 3.9   < > 4.3 4.9 4.4  CL 103   < > 102 102 101  CO2 27   < > 27 23 27   BUN 22*   < > 16 14 12   CREATININE 4.56*   < > 3.25* 3.07* 2.78*  CALCIUM 8.4*   < > 8.3* 8.7* 8.8*  MG 2.1  --  2.4  --  2.5*  PHOS 3.7   < > 2.9 3.3 3.1  GLUCOSE 118*   < > 177* 270* 156*   < > = values in this interval not displayed.   Liver fxn Recent Labs  Lab 07/12/17 0428  07/13/17 0430 07/13/17 1603 07/14/17 0509  AST 16  --   --   --   --   ALT 14*  --   --   --   --   ALKPHOS 74  --   --   --   --  BILITOT 0.4  --   --   --   --   PROT 5.7*  --   --   --   --   ALBUMIN 2.3*   < > 2.3* 2.4* 2.6*   < > = values in this interval not displayed.   coags Recent Labs  Lab 07/13/17 0430 07/14/17 0509  APTT 171* >200*   Sepsis markers No results for input(s): LATICACIDVEN, PROCALCITON in the last 168 hours. Cardiac markers No results for input(s): CKTOTAL, CKMB, TROPONINI in the last 168 hours. BNP No results for input(s): PROBNP in the last 168 hours.   ABG No results for input(s): PHART, PCO2ART, PO2ART, HCO3, TCO2 in the last 168 hours.  CBG trend Recent Labs  Lab 07/13/17 0716 07/13/17 1111 07/13/17 1512 07/13/17 1923 07/13/17 2216  GLUCAP 162* 210* 240* 196* 270*   DIAGNOSES: Principal Problem:   Hypertensive emergency Active Problems:   Hypertension   HLD (hyperlipidemia)   CAD (coronary artery disease)   Type II diabetes mellitus with renal manifestations (HCC)   S/P CABG x 3   Acute on chronic systolic CHF (congestive heart failure) (HCC)   Anemia associated with chronic renal failure   Acute renal  failure superimposed on stage 5 chronic kidney disease, not on chronic dialysis ([place])   Acute respiratory failure with hypoxia (HCC)  ASSESSMENT / PLAN:  42 y.o male who presented on 3/25 with signs and symptoms of volume overload and hypertensive emergency secondary to acute on chronic renal failure. Initially failed interval HD (attempted for 4 days) and now responding well to CRRT (initiated on 3/30).  RENAL A: Acute on chronic renal failure (Cr 2.19 -> 8.86 ).  Started on HD due to volume overload.  Failed interval HD and transitioned to CRRT on 3/30  Fluid status: net negative 7.9 L over the interval  Weight is down almost 20 kg  Hemodynamically stable   P:   Volume status significantly improved.  Patient has outpatient HD arranged at [facility] on MWF Vascular surgery following the patient for AVF placement  Nephrology onboard planning to stop CRRT this AM PTT elevated to >200, need to decrease heparin   PULMONARY A: Bilateral Pulmonary Edema on admission CXR  Oxygen saturations >95% on RA History of OSA  P:   CPAP qHS  CARDIOVASCULAR A: Hypertensive Emergency  Unable to maintain nitro drip  Better controlled  P:  On Amlodipine 10 mg QD, Hydralazine 50 mg TID, Clonidine 0.2 mg BID, and furosemide 80 mg TID  HEMATOLOGIC A: Iron Deficiency Anemia  Hgb stable around 8.0 with elevated RDW S/p 1 units pRBCs No chest pain, active bleeding, or hemodynamic instability   P:  On Epo and iron given the patient's Hgb and renal disease  INFECTIOUS No signs of acute infection.   P:   Continue to monitor fever curve  ENDOCRINE Patient has Type 2 DM on Glipizide 4 mg q daily   P:   CBGs remain above goal of 140-180 Currently on Levemir 12 units qHS and SSI-sensitive with evening coverage. Will likely need to add on meal time scheduled mealtime coverage and change sliding scale to moderate sensitivity   Pulmonary and Critical Care Medicine [facility] Pager: [phone]  07/14/2017, 7:09 AM

## 2017-07-14 NOTE — Progress Notes (Signed)
Patient transferred to 64m16 via wheelchair .He tolerated transfer well

## 2017-07-14 NOTE — Progress Notes (Signed)
Inpatient Diabetes Program Recommendations  AACE/ADA: New Consensus Statement on Inpatient Glycemic Control (2015)  Target Ranges:  Prepandial:   less than 140 mg/dL      Peak postprandial:   less than 180 mg/dL (1-2 hours)      Critically ill patients:  140 - 180 mg/dL   Lab Results  Component Value Date   GLUCAP 270 (H) 07/13/2017   HGBA1C 6.5 (H) 07/06/2017    Review of Glycemic Control Results for [name], [name]" (MRN [id number]) as of 07/14/2017 07:42  Ref. Range 07/13/2017 07:16 07/13/2017 11:11 07/13/2017 15:12 07/13/2017 19:23 07/13/2017 22:16  Glucose-Capillary Latest Ref Range: 65 - 99 mg/dL 162 (H) 210 (H) 240 (H) 196 (H) 270 (H)   Diabetes history: DM2 Outpatient Diabetes medications: Amaryl 4 mg bid  Current orders for Inpatient glycemic control: Levemir 12 units + Novolog sensitive correction tid + hs  Inpatient Diabetes Program Recommendations:   Noted postprandial CBGs elevated.  -Add Novolog 2-3 units meal coverage tid if eats 50%  Thank you, Bethena Roys E. Adasia Hoar, RN, MSN, CDE  Diabetes Coordinator Inpatient Glycemic Control Team Team Pager [phone] (8am-5pm) 07/14/2017 7:43 AM

## 2017-07-14 NOTE — Care Management Note (Addendum)
Case Management Note Original Note by Marvetta Gibbons RN, BSN Unit 4E-Case Manager [phone]  Patient Details  Name: MAKAEL STEIN MRN: [id number] Date of Birth: 03/01/1976  Subjective/Objective:  Pt admitted with HTN emergency and acute on chronic RF. Pt will need to start HD- plan for 1st treatment today 3/27-with 3 straight HD days then transition to regular 3x week regiment- CLIP process started for outpt chair.                 Action/Plan: PTA pt lived at home with SO, plan to return home once medically stable and clipping process completed. - CM to follow for transition of care needs.   Expected Discharge Date:                  Expected Discharge Plan:  Home/Self Care  In-House Referral:     Discharge planning Services  CM Consult  Post Acute Care Choice:    Choice offered to:     DME Arranged:    DME Agency:     HH Arranged:    HH Agency:     Status of Service:  In process, will continue to follow  If discussed at Long Length of Stay Meetings, dates discussed:    Discharge Disposition:   Additional Comments: 07/14/2017 Pt now s/p CRRT.  Pt has been clipped at outpt HD - family will transport pt back and forth to sessions.  Pt has PCP and denied barriers to obtaining medications.  PT eval ordered.  Pt educated on importance of daily weights and low sodium diet.  Maryclare Labrador, RN 07/14/2017, 3:59 PM

## 2017-07-14 NOTE — Progress Notes (Signed)
Patient has difficulty ambulating on his left ankle and foot .He states that he has plantar fascitis and sometimes wears a brace that his family is going to bring in .He also states that he uses crutches sometimes.Dr Tarri Abernethy informed and he stated that he will order physical therapy for him

## 2017-07-14 NOTE — Progress Notes (Signed)
Report called to 4m01

## 2017-07-14 NOTE — Progress Notes (Signed)
[facility] Progress Note    Subjective:   Feels much better   Objective:   BP 93/62   Pulse 72   Temp (!) 97.5 F (36.4 C) (Oral)   Resp 14   Ht 5\' 7"  (1.702 m)   Wt 100.5 kg (221 lb 9 oz)   SpO2 98%   BMI 34.70 kg/m   Intake/Output: I/O last 3 completed shifts: In: 61 [P.O.:610] Out: 01779 [Urine:450; [id number]]   Intake/Output this shift:  Total I/O In: 100 [P.O.:100] Out: 364 [Other:364] Weight change: -8.2 kg (-18 lb 1.2 oz)  Physical Exam: Gen: NAD CVS: no rub Resp: cta Abd: benign Ext: minimal pretibial edema  Labs: BMET Recent Labs  Lab 07/09/17 1258 07/10/17 0500 07/11/17 1937 07/12/17 0428 07/12/17 1644 07/13/17 0430 07/13/17 1603 07/14/17 0509  NA 137 135 135 138 135 137 137 138  K 3.6 4.0 4.6 3.9 4.6 4.3 4.9 4.4  CL 102 100* 99* 103 100* 102 102 101  CO2 23 26 24 27 24 27 23 27   GLUCOSE 201* 252* 220* 118* 220* 177* 270* 156*  BUN 61* 42* 27* 22* 19 16 14 12   CREATININE 8.52* 6.91* 5.21* 4.56* 3.68* 3.25* 3.07* 2.78*  ALBUMIN 1.8*  --  2.2* 2.3* 2.2* 2.3* 2.4* 2.6*  CALCIUM 7.7* 7.9* 8.3* 8.4* 8.5* 8.3* 8.7* 8.8*  PHOS 5.9*  --  4.6 3.7 3.2 2.9 3.3 3.1   CBC Recent Labs  Lab 07/10/17 0500 07/11/17 1937 07/12/17 0428 07/13/17 0430  WBC 9.9 9.9 9.4 11.5*  HGB 7.2* 7.8* 8.0* 8.3*  HCT 23.0* 25.3* 25.4* 26.6*  MCV 83.6 83.8 84.7 85.8  PLT 244 282 262 316    @IMGRELPRIORS @ Medications:    . amLODipine  10 mg Oral Daily  . aspirin EC  81 mg Oral Daily  . atorvastatin  20 mg Oral Q breakfast  . carvedilol  25 mg Oral BID WC  . Chlorhexidine Gluconate Cloth  6 each Topical Daily  . cholecalciferol  1,000 Units Oral Daily  . cloNIDine  0.2 mg Oral TID  . darbepoetin (ARANESP) injection - DIALYSIS  60 mcg Intravenous Q Wed-HD  . furosemide  80 mg Oral Q8H  . heparin  5,000 Units Subcutaneous Q8H  . hydrALAZINE  50 mg Oral Q8H  . insulin aspart  0-5 Units Subcutaneous QHS  . insulin aspart  0-9 Units  Subcutaneous TID WC  . insulin aspart  2 Units Subcutaneous TID WC  . insulin detemir  12 Units Subcutaneous Daily  . multivitamin with minerals  1 tablet Oral Daily     Assessment/ Plan:   1. Acute on chronic systolic CHF- he is now negative 27 kg, 8 kg overnight.  Will stop CVVHD and continue with IHD.  We discussed fluid restriction of 40oz/day as well as compliance with HD once he is stable for discharge  2. ESRD- new, accepted to [facility] on MWF.  Ok for discharge to home with f/u at HD tomorrow.  New edw is 100kg (down 27kg since admission).  We discussed stopping lasix and not taking BP medications prior to dialysis. 3. Hypertensive urgency- improved with volume removal 4. Anemia: ESA and follow 5. 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 Cont to follow 6. Nutrition: renal diet 7. Hypertension: markedly improved with UF and bp meds 8. Vascular access- has tunneled HD catheter but will need AVF/AVG per VVS 9. Disposition- will be able to come out of ICU tomorrow and arrange for permanent access placement.  If  he remains in the hospital overnight will plan for HD here, otherwise will have HD MWF at [facility]).    Donetta Potts, MD [place] Pager [phone] 07/14/2017, 10:51 AM

## 2017-07-15 DIAGNOSIS — N189 Chronic kidney disease, unspecified: Secondary | ICD-10-CM

## 2017-07-15 LAB — CBC
HCT: 27.5 % — ABNORMAL LOW (ref 39.0–52.0)
HEMOGLOBIN: 8.2 g/dL — AB (ref 13.0–17.0)
MCH: 25.6 pg — ABNORMAL LOW (ref 26.0–34.0)
MCHC: 29.8 g/dL — ABNORMAL LOW (ref 30.0–36.0)
MCV: 85.9 fL (ref 78.0–100.0)
PLATELETS: 359 10*3/uL (ref 150–400)
RBC: 3.2 MIL/uL — AB (ref 4.22–5.81)
RDW: 15.6 % — ABNORMAL HIGH (ref 11.5–15.5)
WBC: 12.9 10*3/uL — AB (ref 4.0–10.5)

## 2017-07-15 LAB — RENAL FUNCTION PANEL
ALBUMIN: 2.3 g/dL — AB (ref 3.5–5.0)
ANION GAP: 10 (ref 5–15)
BUN: 32 mg/dL — ABNORMAL HIGH (ref 6–20)
CALCIUM: 9 mg/dL (ref 8.9–10.3)
CO2: 24 mmol/L (ref 22–32)
Chloride: 100 mmol/L — ABNORMAL LOW (ref 101–111)
Creatinine, Ser: 6.18 mg/dL — ABNORMAL HIGH (ref 0.61–1.24)
GFR calc Af Amer: 12 mL/min — ABNORMAL LOW (ref >60)
GFR, EST NON AFRICAN AMERICAN: 10 mL/min — AB (ref >60)
GLUCOSE: 220 mg/dL — AB (ref 65–99)
PHOSPHORUS: 5.6 mg/dL — AB (ref 2.5–4.6)
Potassium: 4.8 mmol/L (ref 3.5–5.1)
SODIUM: 134 mmol/L — AB (ref 135–145)

## 2017-07-15 LAB — GLUCOSE, CAPILLARY
Glucose-Capillary: 183 mg/dL — ABNORMAL HIGH (ref 65–99)
Glucose-Capillary: 236 mg/dL — ABNORMAL HIGH (ref 65–99)
Glucose-Capillary: 127 mg/dL — ABNORMAL HIGH (ref 65–99)
Glucose-Capillary: 252 mg/dL — ABNORMAL HIGH (ref 65–99)

## 2017-07-15 MED ORDER — DARBEPOETIN ALFA 60 MCG/0.3ML IJ SOSY
PREFILLED_SYRINGE | INTRAMUSCULAR | Status: AC
  Administered 2017-07-15: 60 ug via INTRAVENOUS
  Filled 2017-07-15: qty 0.3

## 2017-07-15 NOTE — Evaluation (Signed)
Physical Therapy Evaluation Patient Details Name: [name] MRN: [id number] DOB: 1975/07/27 Today's Date: 07/15/2017   History of Present Illness  Pt is a 42 y/o male admitted secondary to increased SOB and LE edema. Found to be hypertensive. Chest x-ray showed pulmonary edema. Pt is s/p R IJ HD catheter lpacement on 3/26 and has since started HD. PMH includes DM, CAD s/p CABG, CHF, HTN, and CKD.   Clinical Impression  Pt admitted secondary to problem above with deficits below. Pt presenting with weakness, fatigue, and L foot pain secondary to plantar fasciitis. Required min guard A for mobility with RW. Educated about follow up PT recommendations, however, pt reports he does not feel like he needs at this time. Will continue to follow acutely to maximize functional mobility independence and safety.     Follow Up Recommendations Outpatient PT;Supervision for mobility/OOB(pt will likely refuse )    Equipment Recommendations  None recommended by PT    Recommendations for Other Services       Precautions / Restrictions Precautions Precautions: Fall Restrictions Weight Bearing Restrictions: No      Mobility  Bed Mobility Overal bed mobility: Modified Independent             General bed mobility comments: Increased time, however, no assist required.   Transfers Overall transfer level: Needs assistance Equipment used: Rolling walker (2 wheeled) Transfers: Sit to/from Stand Sit to Stand: Min guard         General transfer comment: Min guard for safety. Verbal cues for safe hand placement.   Ambulation/Gait Ambulation/Gait assistance: Min guard Ambulation Distance (Feet): 125 Feet Assistive device: Rolling walker (2 wheeled) Gait Pattern/deviations: Step-through pattern;Decreased stride length;Wide base of support(bilat foot inversion ) Gait velocity: Decreased  Gait velocity interpretation: Below normal speed for age/gender General Gait Details: Slow, guarded gait  secondary to L foot pain. Pt with L foot plantar fasciitis brace on throughout. Educated about use of RW at home, however, pt wanting to use crutches instead. Further distance limited by fatigue.   Stairs Stairs: (educated about safety with stair navigation at home)          Wheelchair Mobility    Modified Rankin (Stroke Patients Only)       Balance Overall balance assessment: Needs assistance Sitting-balance support: No upper extremity supported;Feet supported Sitting balance-Leahy Scale: Good     Standing balance support: Bilateral upper extremity supported;No upper extremity supported;During functional activity Standing balance-Leahy Scale: Fair Standing balance comment: Able to maintain static standing without UE support.                              Pertinent Vitals/Pain Pain Assessment: 0-10 Pain Score: 5  Pain Location: L foot plantar fasciitis  Pain Descriptors / Indicators: Aching;Constant;Cramping Pain Intervention(s): Limited activity within patient's tolerance;Monitored during session;Repositioned    Home Living Family/patient expects to be discharged to:: Private residence Living Arrangements: Spouse/significant other Available Help at Discharge: Family;Available PRN/intermittently Type of Home: Other(Comment)(condo ) Home Access: Stairs to enter Entrance Stairs-Rails: Right;Left;Can reach both Entrance Stairs-Number of Steps: 12 Home Layout: One level Home Equipment: Crutches      Prior Function Level of Independence: Independent with assistive device(s)         Comments: Uses crutches when plantar fasciitis flares      Hand Dominance        Extremity/Trunk Assessment   Upper Extremity Assessment Upper Extremity Assessment: Overall WFL for tasks assessed  Lower Extremity Assessment Lower Extremity Assessment: LLE deficits/detail;Generalized weakness LLE Deficits / Details: L plantar fasciitis at baseline. WEars brace.      Cervical / Trunk Assessment Cervical / Trunk Assessment: Normal  Communication   Communication: No difficulties  Cognition Arousal/Alertness: Awake/alert Behavior During Therapy: WFL for tasks assessed/performed Overall Cognitive Status: Within Functional Limits for tasks assessed                                        General Comments General comments (skin integrity, edema, etc.): Educated about energy conservation techniques at home. Educated about outpatient PT for plantar fasciitis and for strengthening, however, pt reports he does not feel like he needs at this time.     Exercises     Assessment/Plan    PT Assessment Patient needs continued PT services  PT Problem List Decreased strength;Decreased balance;Decreased mobility;Decreased knowledge of use of DME;Decreased knowledge of precautions;Pain;Decreased activity tolerance       PT Treatment Interventions DME instruction;Gait training;Stair training;Functional mobility training;Therapeutic activities;Therapeutic exercise;Balance training;Neuromuscular re-education;Patient/family education    PT Goals (Current goals can be found in the Care Plan section)  Acute Rehab PT Goals Patient Stated Goal: to go home  PT Goal Formulation: With patient Time For Goal Achievement: 07/29/17 Potential to Achieve Goals: Good    Frequency Min 3X/week   Barriers to discharge        Co-evaluation               AM-PAC PT "6 Clicks" Daily Activity  Outcome Measure Difficulty turning over in bed (including adjusting bedclothes, sheets and blankets)?: None Difficulty moving from lying on back to sitting on the side of the bed? : A Little Difficulty sitting down on and standing up from a chair with arms (e.g., wheelchair, bedside commode, etc,.)?: Unable Help needed moving to and from a bed to chair (including a wheelchair)?: A Little Help needed walking in hospital room?: A Little Help needed climbing 3-5  steps with a railing? : A Little 6 Click Score: 17    End of Session Equipment Utilized During Treatment: Gait belt Activity Tolerance: Patient limited by fatigue;No increased pain Patient left: in bed;with call bell/phone within reach;with family/visitor present Nurse Communication: Mobility status PT Visit Diagnosis: Muscle weakness (generalized) (M62.81);Pain Pain - Right/Left: Left Pain - part of body: Ankle and joints of foot    Time: 2683-4196 PT Time Calculation (min) (ACUTE ONLY): 16 min   Charges:   PT Evaluation $PT Eval Low Complexity: 1 Low     PT G Codes:        Leighton Ruff, PT, DPT  Acute Rehabilitation Services  Pager: [phone]   Rudean Hitt 07/15/2017, 11:23 AM

## 2017-07-15 NOTE — Progress Notes (Signed)
Patient ID: [name], male   DOB: 1976-02-01, 42 y.o.   MRN: [id number] Comfortable currently on hemodialysis via his catheter.  Improve volume status and improved swelling in his extremities.  Will be discharged to a Monday Wednesday Friday dialysis schedule.  We will coordinate outpatient left arm access on Tuesday or Thursday next week

## 2017-07-15 NOTE — Progress Notes (Signed)
PROGRESS NOTE  ANNETTE LIOTTA YWV:[id number] DOB: 04/11/1976 DOA: 07/06/2017 PCP: Larene Beach, MD  HPI/Recap of past 24 hours: 42 y.o male with history of DM and CAD s/p CABG, CHF, CKD stage V followed by [facility] neprologist, presented to the ED with dyspnea, hypertensive emergency, and LE edema. He was found to have worsening renal function and given his volume overload, HD was initiated after placing a tunneled HD catheter. After 4 days of HD his HTN and volume overload failed to significantly improve. Pt was subsequently transferred to the ICU for CRRT.  Patient was transferred out from the ICU and [facility] hospitalist assumed care once again.  Patient is set up for outpatient hemodialysis.  Today, patient reported feeling much better denied any worsening dyspnea, chest pain, nausea/vomiting, abdominal pain, diarrhea, fever/chills.  Assessment/Plan: Principal Problem:   Hypertensive emergency Active Problems:   Hypertension   HLD (hyperlipidemia)   CAD (coronary artery disease)   Type II diabetes mellitus with renal manifestations (HCC)   S/P CABG x 3   Acute on chronic systolic CHF (congestive heart failure) (HCC)   Anemia associated with chronic renal failure   Acute renal failure superimposed on stage 5 chronic kidney disease, not on chronic dialysis (HCC)   Acute respiratory failure with hypoxia ([place])  New ESRD on hemodialysis Progressed from stage V to ESRD with significant volume overload  Failed interval hemodialysis, had a brief period of CRRT in ICU Volume status improved, outpatient dialysis set up on M/W/F Vascular surgery following patient for AVF placement as an outpatient Nephrology on board  Acute pulmonary edema Improved, currently on room air Likely due to hypertensive emergency Management as above  Hypertensive emergency Controlled Continue amlodipine, hydralazine, clonidine, Lasix  Anemia of chronic renal disease Stable Baseline around 11, no signs of  bleeding S/P 2 U of PRBC Daily CBC  Acute on chronic diastolic HF Stable Echo with EF of 55-60%, no regional wall motion abnormalities with grade 2 diastolic dysfunction Volume management per HD Continue Lasix Continue strict I's and O's, daily weights  Coronary artery disease status post CABG with elevated troponin  Chest pain-free Troponin with flat trend, EKG no acute ST changes Echo as above Continue aspirin, statin, beta-blocker   Diabetes mellitus type II A1c 6.5, CBGs with better control Continue Levemir, SSI, NovoLog 3 times daily with meals Held home glimepiride   OSA CPAP at night      Code Status: Full  Family Communication: Mother at bedside  Disposition Plan: Plan to discharge on 07/16/17   Consultants:  PCCM  Nephrology  Vascular surgery  Procedures:  CRRT  Tunneled catheter placement  Antimicrobials:  None  DVT prophylaxis: Heparin   Objective: Vitals:   07/15/17 1430 07/15/17 1500 07/15/17 1556 07/15/17 1709  BP: (!) 148/84 140/80 135/79 126/67  Pulse: 79 80 81 85  Resp:   16 18  Temp:   98 F (36.7 C) 99.6 F (37.6 C)  TempSrc:   Oral Oral  SpO2:   98% 99%  Weight:   97.1 kg (214 lb 1.1 oz)   Height:        Intake/Output Summary (Last 24 hours) at 07/15/2017 1850 Last data filed at 07/15/2017 1556 Gross per 24 hour  Intake 60 ml  Output 3000 ml  Net -2940 ml   Filed Weights   07/14/17 0400 07/15/17 1155 07/15/17 1556  Weight: 100.5 kg (221 lb 9 oz) 100 kg (220 lb 7.4 oz) 97.1 kg (214 lb 1.1 oz)  Exam:   General: NAD  Cardiovascular: S1, S2 present  Respiratory: Chest clear to auscultation bilaterally  Abdomen: Obese, soft, nontender, bowel sounds present  Musculoskeletal: No pedal edema bilaterally  Skin: Normal  Psychiatry: Normal mood   Data Reviewed: CBC: Recent Labs  Lab 07/10/17 0500 07/11/17 1937 07/12/17 0428 07/13/17 0430 07/15/17 1100  WBC 9.9 9.9 9.4 11.5* 12.9*  HGB 7.2* 7.8*  8.0* 8.3* 8.2*  HCT 23.0* 25.3* 25.4* 26.6* 27.5*  MCV 83.6 83.8 84.7 85.8 85.9  PLT 244 282 262 316 024   Basic Metabolic Panel: Recent Labs  Lab 07/12/17 0428 07/12/17 1644 07/13/17 0430 07/13/17 1603 07/14/17 0509 07/15/17 1155  NA 138 135 137 137 138 134*  K 3.9 4.6 4.3 4.9 4.4 4.8  CL 103 100* 102 102 101 100*  CO2 27 24 27 23 27 24   GLUCOSE 118* 220* 177* 270* 156* 220*  BUN 22* 19 16 14 12  32*  CREATININE 4.56* 3.68* 3.25* 3.07* 2.78* 6.18*  CALCIUM 8.4* 8.5* 8.3* 8.7* 8.8* 9.0  MG 2.1  --  2.4  --  2.5*  --   PHOS 3.7 3.2 2.9 3.3 3.1 5.6*   GFR: Estimated Creatinine Clearance: 17.3 mL/min (A) (by C-G formula based on SCr of 6.18 mg/dL (H)). Liver Function Tests: Recent Labs  Lab 07/12/17 0428 07/12/17 1644 07/13/17 0430 07/13/17 1603 07/14/17 0509 07/15/17 1155  AST 16  --   --   --   --   --   ALT 14*  --   --   --   --   --   ALKPHOS 74  --   --   --   --   --   BILITOT 0.4  --   --   --   --   --   PROT 5.7*  --   --   --   --   --   ALBUMIN 2.3* 2.2* 2.3* 2.4* 2.6* 2.3*   No results for input(s): LIPASE, AMYLASE in the last 168 hours. No results for input(s): AMMONIA in the last 168 hours. Coagulation Profile: No results for input(s): INR, PROTIME in the last 168 hours. Cardiac Enzymes: No results for input(s): CKTOTAL, CKMB, CKMBINDEX, TROPONINI in the last 168 hours. BNP (last 3 results) No results for input(s): PROBNP in the last 8760 hours. HbA1C: No results for input(s): HGBA1C in the last 72 hours. CBG: Recent Labs  Lab 07/14/17 1644 07/14/17 2048 07/15/17 0754 07/15/17 1129 07/15/17 1650  GLUCAP 227* 287* 183* 236* 127*   Lipid Profile: No results for input(s): CHOL, HDL, LDLCALC, TRIG, CHOLHDL, LDLDIRECT in the last 72 hours. Thyroid Function Tests: No results for input(s): TSH, T4TOTAL, FREET4, T3FREE, THYROIDAB in the last 72 hours. Anemia Panel: No results for input(s): VITAMINB12, FOLATE, FERRITIN, TIBC, IRON, RETICCTPCT in  the last 72 hours. Urine analysis:    Component Value Date/Time   COLORURINE STRAW (A) 07/06/2017 1006   APPEARANCEUR CLEAR 07/06/2017 1006   LABSPEC 1.010 07/06/2017 1006   PHURINE 5.0 07/06/2017 1006   GLUCOSEU 50 (A) 07/06/2017 1006   HGBUR NEGATIVE 07/06/2017 1006   BILIRUBINUR NEGATIVE 07/06/2017 1006   BILIRUBINUR negative 08/18/2016 1702   KETONESUR NEGATIVE 07/06/2017 1006   PROTEINUR >=300 (A) 07/06/2017 1006   UROBILINOGEN 0.2 08/18/2016 1702   UROBILINOGEN 1.0 12/23/2008 0539   NITRITE NEGATIVE 07/06/2017 1006   LEUKOCYTESUR NEGATIVE 07/06/2017 1006   Sepsis Labs: @LABRCNTIP (procalcitonin:4,lacticidven:4)  ) Recent Results (from the past 240 hour(s))  Surgical pcr  screen     Status: None   Collection Time: 07/06/17 11:16 PM  Result Value Ref Range Status   MRSA, PCR NEGATIVE NEGATIVE Final   Staphylococcus aureus NEGATIVE NEGATIVE Final    Comment: (NOTE) The Xpert SA Assay (FDA approved for NASAL specimens in patients 57 years of age and older), is one component of a comprehensive surveillance program. It is not intended to diagnose infection nor to guide or monitor treatment. Performed at [facility], [place] [address] [place], [place] [address]   MRSA PCR Screening     Status: None   Collection Time: 07/11/17  6:17 PM  Result Value Ref Range Status   MRSA by PCR NEGATIVE NEGATIVE Final    Comment:        The GeneXpert MRSA Assay (FDA approved for NASAL specimens only), is one component of a comprehensive MRSA colonization surveillance program. It is not intended to diagnose MRSA infection nor to guide or monitor treatment for MRSA infections. Performed at [facility], [place] [address], [place], [place] [address]       Studies: No results found.  Scheduled Meds: . amLODipine  10 mg Oral Daily  . aspirin EC  81 mg Oral Daily  . atorvastatin  20 mg Oral Q breakfast  . carvedilol  25 mg Oral BID WC  . Chlorhexidine Gluconate Cloth  6  each Topical Daily  . cholecalciferol  1,000 Units Oral Daily  . cloNIDine  0.2 mg Oral TID  . darbepoetin (ARANESP) injection - DIALYSIS  60 mcg Intravenous Q Wed-HD  . furosemide  80 mg Oral Q8H  . heparin  5,000 Units Subcutaneous Q8H  . hydrALAZINE  50 mg Oral Q8H  . insulin aspart  0-5 Units Subcutaneous QHS  . insulin aspart  0-9 Units Subcutaneous TID WC  . insulin aspart  2 Units Subcutaneous TID WC  . insulin detemir  12 Units Subcutaneous Daily  . multivitamin with minerals  1 tablet Oral Daily    Continuous Infusions: . sodium chloride    . ferric gluconate (FERRLECIT/NULECIT) IV 125 mg (07/15/17 1451)     LOS: 9 days     Alma Friendly, MD [facility]   If 7PM-7AM, please contact night-coverage [email or web address] Password [email or web address] 07/15/2017, 6:50 PM

## 2017-07-15 NOTE — Progress Notes (Signed)
    He is out of the ICU and his BP is more stable. He has acceptable Cephalic veins B UE and is right hand dominant.  Based on scheduling we will plan Left brachial cephalic AV fistula creation.    A/P ESRD Pending scheduling and discharge date.  I will discuss this with Dr. Donnetta Hutching The patient is in agreement to proceed with surgery.   Roxy Horseman PA-C

## 2017-07-16 ENCOUNTER — Telehealth: Payer: Self-pay | Admitting: *Deleted

## 2017-07-16 ENCOUNTER — Other Ambulatory Visit: Payer: Self-pay | Admitting: *Deleted

## 2017-07-16 LAB — TYPE AND SCREEN
ABO/RH(D): A POS
Antibody Screen: NEGATIVE
UNIT DIVISION: 0

## 2017-07-16 LAB — GLUCOSE, CAPILLARY
GLUCOSE-CAPILLARY: 159 mg/dL — AB (ref 65–99)
GLUCOSE-CAPILLARY: 235 mg/dL — AB (ref 65–99)

## 2017-07-16 LAB — BPAM RBC
Blood Product Expiration Date: 201904062359
Unit Type and Rh: 6200

## 2017-07-16 MED ORDER — GLIMEPIRIDE 2 MG PO TABS: ORAL_TABLET | ORAL | 0 refills | Status: DC

## 2017-07-16 MED ORDER — AMLODIPINE BESYLATE 10 MG PO TABS: 10.0000 mg | ORAL_TABLET | Freq: Every day | ORAL | 0 refills | Status: DC

## 2017-07-16 MED ORDER — ASPIRIN 81 MG PO TBEC: 81.0000 mg | DELAYED_RELEASE_TABLET | Freq: Every day | ORAL | 0 refills | Status: AC

## 2017-07-16 MED ORDER — HYDRALAZINE HCL 50 MG PO TABS: 50.0000 mg | ORAL_TABLET | Freq: Three times a day (TID) | ORAL | 0 refills | Status: DC

## 2017-07-16 MED ORDER — CLONIDINE HCL 0.2 MG PO TABS: 0.2000 mg | ORAL_TABLET | Freq: Three times a day (TID) | ORAL | 0 refills | Status: DC

## 2017-07-16 NOTE — Discharge Summary (Signed)
Discharge Summary  [name] YDX:[id number] DOB: 1975-08-14  PCP: Larene Beach, MD  Admit date: 07/06/2017 Discharge date: 07/16/2017  Time spent: 40 mins  Recommendations for Outpatient Follow-up:  1. PCP 2. Nephrology/HD 3. Vascular surgery   Discharge Diagnoses:  Active Hospital Problems   Diagnosis Date Noted  . Hypertensive emergency 07/06/2017  . Acute on chronic systolic CHF (congestive heart failure) ([place]) 07/06/2017  . Anemia associated with chronic renal failure 07/06/2017  . Acute renal failure superimposed on stage 5 chronic kidney disease, not on chronic dialysis ([place]) 07/06/2017  . Acute respiratory failure with hypoxia ([place]) 07/06/2017  . 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 Type II diabetes mellitus with renal manifestations ([place]) 04/21/2013  . CAD (coronary artery disease) 05/11/2011  . HLD (hyperlipidemia) 05/11/2011  . Hypertension 05/11/2011    Resolved Hospital Problems  No resolved problems to display.    Discharge Condition: Stable  Diet recommendation: Renal, heart healthy   Vitals:   07/16/17 0618 07/16/17 1007  BP: (!) 145/68 (!) 124/45  Pulse: 79 74  Resp: 18 16  Temp: 98.9 F (37.2 C) 98.7 F (37.1 C)  SpO2: 99% 95%    History of present illness:  42 y.o male with history of DM and CAD s/p CABG, CHF, CKD stage V followed by [facility] neprologist, presented to the ED with dyspnea, hypertensive emergency, and LE edema. He was found to have worsening renal function and given his volume overload, HD was initiated after placing a tunneled HD catheter. After 4 days of HD his HTN and volume overload failed to significantly improve. Pt was subsequently transferred to the ICU for CRRT.  Patient was transferred out from the ICU and [facility] hospitalist assumed care once again.  Patient is set up for outpatient hemodialysis.  Today, patient denies any new complaints, no chest pain, no worsening shortness of breath, no abdominal pain, fever/chills,  nausea/vomiting.  Patient stable for discharge, next dialysis 07/17/17.  Hospital Course:  Principal Problem:   Hypertensive emergency Active Problems:   Hypertension   HLD (hyperlipidemia)   CAD (coronary artery disease)   Type II diabetes mellitus with renal manifestations (HCC)   S/P CABG x 3   Acute on chronic systolic CHF (congestive heart failure) (HCC)   Anemia associated with chronic renal failure   Acute renal failure superimposed on stage 5 chronic kidney disease, not on chronic dialysis (HCC)   Acute respiratory failure with hypoxia ([place])  New ESRD on hemodialysis Progressed from stage V to ESRD with significant volume overload  Failed interval hemodialysis, had a brief period of CRRT in ICU Volume status improved, outpatient dialysis set up for M/W/F Vascular surgery following patient for AVF placement as an outpatient Nephrology stable to be discharged  Acute pulmonary edema Resolved, currently on room air Likely due to hypertensive emergency  Hypertensive emergency Controlled Continue amlodipine, hydralazine, clonidine PCP to follow-up closely  Anemia of chronic renal disease Stable Baseline around 11, no signs of bleeding S/P 2 U of PRBC PCP to follow-up with repeat CBC  Acute on chronic diastolic HF Stable Echo with EF of 55-60%, no regional wall motion abnormalities with grade 2 diastolic dysfunction Volume management per HD  Coronary artery disease status post CABG with elevated troponin  Chest pain-free Troponin with flat trend, EKG no acute ST changes Echo as above Continue aspirin, statin, beta-blocker  Diabetes mellitus type II A1c 6.5, CBGs with better control Continue home glimepiride 2 mg daily, PCP to adjust accordingly.  If  diabetes becomes uncontrolled, may switch to insulin Diabetes coordinators on board okay with the above home regimen  OSA CPAP at night   Procedures:  CRRT  Tunneled catheter  placement  Consultations:  PCCM  Nephrology  Vascular surgery  Discharge Exam: BP (!) 124/45 (BP Location: Right Arm)   Pulse 74   Temp 98.7 F (37.1 C) (Oral)   Resp 16   Ht '5\' 7"'$  (1.702 m)   Wt 96.6 kg (213 lb)   SpO2 95%   BMI 33.36 kg/m   General: NAD Cardiovascular: S1, S2 present Respiratory: Chest clear to auscultation bilaterally  Discharge Instructions You were cared for by a hospitalist during your hospital stay. If you have any questions about your discharge medications or the care you received while you were in the hospital after you are discharged, you can call the unit and asked to speak with the hospitalist on call if the hospitalist that took care of you is not available. Once you are discharged, your primary care physician will handle any further medical issues. Please note that NO REFILLS for any discharge medications will be authorized once you are discharged, as it is imperative that you return to your primary care physician (or establish a relationship with a primary care physician if you do not have one) for your aftercare needs so that they can reassess your need for medications and monitor your lab values.   Allergies as of 07/16/2017   No Known Allergies     Medication List    STOP taking these medications   torsemide 20 MG tablet Commonly known as:  DEMADEX     TAKE these medications   amLODipine 10 MG tablet Commonly known as:  NORVASC Take 1 tablet (10 mg total) by mouth daily. What changed:  when to take this   aspirin 81 MG EC tablet Take 1 tablet (81 mg total) by mouth daily.   atorvastatin 20 MG tablet Commonly known as:  LIPITOR TAKE ONE TABLET BY MOUTH ONCE DAILY AT BEDTIME   blood glucose meter kit and supplies Kit Dispense based on patient and insurance preference. Use QD-BID home glucose monitoring. (FOR ICD-9 250.00, 250.01).   carvedilol 25 MG tablet Commonly known as:  COREG TAKE 1 TABLET BY MOUTH TWICE DAILY    cholecalciferol 1000 units tablet Commonly known as:  VITAMIN D Take 1,000 Units by mouth daily.   cloNIDine 0.2 MG tablet Commonly known as:  CATAPRES Take 1 tablet (0.2 mg total) by mouth 3 (three) times daily. What changed:    medication strength  how much to take  when to take this   fish oil-omega-3 fatty acids 1000 MG capsule Take 2 g by mouth daily.   glimepiride 2 MG tablet Commonly known as:  AMARYL TAKE 2 MG BY MOUTH ONCE DAILY What changed:    medication strength  See the new instructions.   hydrALAZINE 50 MG tablet Commonly known as:  APRESOLINE Take 1 tablet (50 mg total) by mouth every 8 (eight) hours.   multivitamin tablet Take 1 tablet by mouth daily.   nitroGLYCERIN 0.4 MG SL tablet Commonly known as:  NITROSTAT DISSOLVE ONE TABLET UNDER THE TONGUE EVERY 5 MINUTES AS NEEDED FOR CHEST PAIN.  DO NOT EXCEED A TOTAL OF 3 DOSES IN 15 MINUTES      No Known Allergies Follow-up Information    Larene Beach, MD Follow up.   Specialty:  Family Medicine Contact information: [place] [address]  [phone]        Rosetta Posner, MD. Schedule an appointment as soon as possible for a visit in 1 week(s).   Specialties:  Vascular Surgery, Cardiology Contact information: [place] [address] [phone]        [facility] Follow up.   Specialty:  Rehabilitation Why:  They will call you to set up appointment for outpatient physical therapy Contact information: [address] [id number] [place] Follow up.   Why:  Monday, Wednesday, Friday Contact information: [place],  [place], [place] [address]  Phone:  [phone]           The results of significant diagnostics from this hospitalization (including imaging, microbiology, ancillary and laboratory) are listed below for  reference.    Significant Diagnostic Studies: Dg Chest 2 View  Result Date: 07/06/2017 CLINICAL DATA:  Shortness of breath EXAM: CHEST - 2 VIEW COMPARISON:  None. FINDINGS: Remote median sternotomy for CABG. Cardiomegaly. Small pleural effusions, right greater than left. Moderate alveolar edema. IMPRESSION: Cardiomegaly and moderate alveolar pulmonary edema. Small pleural effusions. Findings suggest congestive heart failure. Electronically Signed   By: Ulyses Jarred M.D.   On: 07/06/2017 00:43   US Renal  Result Date: 07/06/2017 CLINICAL DATA:  42 year old male with AKI. History of diabetes and hypertension. EXAM: RENAL / URINARY TRACT ULTRASOUND COMPLETE COMPARISON:  None. FINDINGS: Right Kidney: Length: 12 cm. Increased echogenicity. No hydronephrosis or shadowing stone. Left Kidney: Length: 12 cm. Increased echogenicity. No hydronephrosis or shadowing stone. Bladder: Appears normal for degree of bladder distention. IMPRESSION: Increased renal echogenicity likely related to medical renal disease. Clinical correlation is recommended. No hydronephrosis or shadowing stone. Electronically Signed   By: Anner Crete M.D.   On: 07/06/2017 04:10   Dg Chest Port 1 View  Result Date: 07/07/2017 CLINICAL DATA:  Followup catheter placement. EXAM: PORTABLE CHEST 1 VIEW COMPARISON:  07/06/2017 FINDINGS: Right internal jugular catheter has been placed. The tip is at the SVC RA junction. No pneumothorax. Venous hypertension and interstitial edema persist with small effusions, right more than left. IMPRESSION: Catheter tip at the SVC RA junction.  No complication. Electronically Signed   By: Nelson Chimes M.D.   On: 07/07/2017 14:39   Dg Fluoro Guide Cv Line-no Report  Result Date: 07/07/2017 Fluoroscopy was utilized by the requesting physician.  No radiographic interpretation.    Microbiology: Recent Results (from the past 240 hour(s))  Surgical pcr screen     Status: None   Collection Time: 07/06/17  11:16 PM  Result Value Ref Range Status   MRSA, PCR NEGATIVE NEGATIVE Final   Staphylococcus aureus NEGATIVE NEGATIVE Final    Comment: (NOTE) The Xpert SA Assay (FDA approved for NASAL specimens in patients 66 years of age and older), is one component of a comprehensive surveillance program. It is not intended to diagnose infection nor to guide or monitor treatment. Performed at [facility], [place] [address], [place], [place] [address]   MRSA PCR Screening     Status: None   Collection Time: 07/11/17  6:17 PM  Result Value Ref Range Status   MRSA by PCR NEGATIVE NEGATIVE Final    Comment:        The GeneXpert MRSA Assay (FDA approved for NASAL specimens only), is one component of a comprehensive MRSA colonization surveillance program. It is not intended to diagnose MRSA infection nor to  guide or monitor treatment for MRSA infections. Performed at [facility], [place] [address], [place],  [address]      Labs: Basic Metabolic Panel: Recent Labs  Lab 07/12/17 0428 07/12/17 1644 07/13/17 0430 07/13/17 1603 07/14/17 0509 07/15/17 1155  NA 138 135 137 137 138 134*  K 3.9 4.6 4.3 4.9 4.4 4.8  CL 103 100* 102 102 101 100*  CO2 '27 24 27 23 27 24  '$ GLUCOSE 118* 220* 177* 270* 156* 220*  BUN 22* '19 16 14 12 '$ 32*  CREATININE 4.56* 3.68* 3.25* 3.07* 2.78* 6.18*  CALCIUM 8.4* 8.5* 8.3* 8.7* 8.8* 9.0  MG 2.1  --  2.4  --  2.5*  --   PHOS 3.7 3.2 2.9 3.3 3.1 5.6*   Liver Function Tests: Recent Labs  Lab 07/12/17 0428 07/12/17 1644 07/13/17 0430 07/13/17 1603 07/14/17 0509 07/15/17 1155  AST 16  --   --   --   --   --   ALT 14*  --   --   --   --   --   ALKPHOS 74  --   --   --   --   --   BILITOT 0.4  --   --   --   --   --   PROT 5.7*  --   --   --   --   --   ALBUMIN 2.3* 2.2* 2.3* 2.4* 2.6* 2.3*   No results for input(s): LIPASE, AMYLASE in the last 168 hours. No results for input(s): AMMONIA in the last 168 hours. CBC: Recent Labs  Lab  07/10/17 0500 07/11/17 1937 07/12/17 0428 07/13/17 0430 07/15/17 1100  WBC 9.9 9.9 9.4 11.5* 12.9*  HGB 7.2* 7.8* 8.0* 8.3* 8.2*  HCT 23.0* 25.3* 25.4* 26.6* 27.5*  MCV 83.6 83.8 84.7 85.8 85.9  PLT 244 282 262 316 359   Cardiac Enzymes: No results for input(s): CKTOTAL, CKMB, CKMBINDEX, TROPONINI in the last 168 hours. BNP: BNP (last 3 results) Recent Labs    07/06/17 0104  BNP 2,431.0*    ProBNP (last 3 results) No results for input(s): PROBNP in the last 8760 hours.  CBG: Recent Labs  Lab 07/15/17 1129 07/15/17 1650 07/15/17 2136 07/16/17 0758 07/16/17 1123  GLUCAP 236* 127* 252* 159* 235*       Signed:  Alma Friendly, MD [facility] 07/16/2017, 6:30 PM

## 2017-07-16 NOTE — Progress Notes (Signed)
[facility] Progress Note    Subjective:    Feels well   Objective:   BP (!) 145/68 (BP Location: Right Arm)   Pulse 79   Temp 98.9 F (37.2 C) (Oral)   Resp 18   Ht 5\' 7"  (1.702 m)   Wt 96.6 kg (213 lb)   SpO2 99%   BMI 33.36 kg/m   Intake/Output: I/O last 3 completed shifts: In: 290 [P.O.:180; IV Piggyback:110] Out: 3000 [Other:3000]   Intake/Output this shift:  No intake/output data recorded. Weight change:   Physical Exam: Gen:NAD CVS: no rub Resp: cta  SAY:TKZSWF Ext: no edema  Labs: BMET Recent Labs  Lab 07/11/17 1937 07/12/17 0428 07/12/17 1644 07/13/17 0430 07/13/17 1603 07/14/17 0509 07/15/17 1155  NA 135 138 135 137 137 138 134*  K 4.6 3.9 4.6 4.3 4.9 4.4 4.8  CL 99* 103 100* 102 102 101 100*  CO2 24 27 24 27 23 27 24   GLUCOSE 220* 118* 220* 177* 270* 156* 220*  BUN 27* 22* 19 16 14 12  32*  CREATININE 5.21* 4.56* 3.68* 3.25* 3.07* 2.78* 6.18*  ALBUMIN 2.2* 2.3* 2.2* 2.3* 2.4* 2.6* 2.3*  CALCIUM 8.3* 8.4* 8.5* 8.3* 8.7* 8.8* 9.0  PHOS 4.6 3.7 3.2 2.9 3.3 3.1 5.6*   CBC Recent Labs  Lab 07/11/17 1937 07/12/17 0428 07/13/17 0430 07/15/17 1100  WBC 9.9 9.4 11.5* 12.9*  HGB 7.8* 8.0* 8.3* 8.2*  HCT 25.3* 25.4* 26.6* 27.5*  MCV 83.8 84.7 85.8 85.9  PLT 282 262 316 359    @IMGRELPRIORS @ Medications:    . amLODipine  10 mg Oral Daily  . aspirin EC  81 mg Oral Daily  . atorvastatin  20 mg Oral Q breakfast  . carvedilol  25 mg Oral BID WC  . Chlorhexidine Gluconate Cloth  6 each Topical Daily  . cholecalciferol  1,000 Units Oral Daily  . cloNIDine  0.2 mg Oral TID  . darbepoetin (ARANESP) injection - DIALYSIS  60 mcg Intravenous Q Wed-HD  . furosemide  80 mg Oral Q8H  . heparin  5,000 Units Subcutaneous Q8H  . hydrALAZINE  50 mg Oral Q8H  . insulin aspart  0-5 Units Subcutaneous QHS  . insulin aspart  0-9 Units Subcutaneous TID WC  . insulin aspart  2 Units Subcutaneous TID WC  . insulin detemir  12 Units  Subcutaneous Daily  . multivitamin with minerals  1 tablet Oral Daily     Assessment/ Plan:   1. Acute on chronic systolic CHF- he is now negative 27 kg, 8 kg overnight. Will stop CVVHD and continue with IHD.  We discussed fluid restriction of 40oz/day as well as compliance with HD once he is stable for discharge  2. ESRD- new, accepted to [facility] on MWF. Ok for discharge to home with f/u at HD tomorrow.  New edw is 100kg (down 27kg since admission).  We discussed stopping lasix and not taking BP medications prior to dialysis. 3. Hypertensive urgency- improved with volume removal 4. Anemia:ESA and follow 5. 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 Cont to follow 6. Nutrition:renal diet 7. Hypertension:markedly improved with UF and bp meds 8. Vascular access- has tunneled HD catheter but will need AVF/AVG per [facility] to be arranged as an outpatient per Dr. Donnetta Hutching. 9. Disposition- will be able to come out of ICU tomorrow and arrange for permanent access placement.  If he remains in the hospital overnight will plan for HD here, otherwise will have HD MWF at [facility] (  [facility]).    Donetta Potts, MD [place] Pager [phone] 07/16/2017, 8:38 AM

## 2017-07-16 NOTE — Progress Notes (Addendum)
[facility] Progress Note    Subjective:   Feels well   Objective:   BP (!) 124/45 (BP Location: Right Arm)   Pulse 74   Temp 98.7 F (37.1 C) (Oral)   Resp 16   Ht 5\' 7"  (1.702 m)   Wt 96.6 kg (213 lb)   SpO2 95%   BMI 33.36 kg/m   Intake/Output: I/O last 3 completed shifts: In: 290 [P.O.:180; IV Piggyback:110] Out: 3000 [Other:3000]   Intake/Output this shift:  No intake/output data recorded. Weight change:   Physical Exam: Gen: NAD CVS: no rub Resp: cta Abd: benign Ext: trace pretibial edema  Labs: BMET Recent Labs  Lab 07/11/17 1937 07/12/17 0428 07/12/17 1644 07/13/17 0430 07/13/17 1603 07/14/17 0509 07/15/17 1155  NA 135 138 135 137 137 138 134*  K 4.6 3.9 4.6 4.3 4.9 4.4 4.8  CL 99* 103 100* 102 102 101 100*  CO2 24 27 24 27 23 27 24   GLUCOSE 220* 118* 220* 177* 270* 156* 220*  BUN 27* 22* 19 16 14 12  32*  CREATININE 5.21* 4.56* 3.68* 3.25* 3.07* 2.78* 6.18*  ALBUMIN 2.2* 2.3* 2.2* 2.3* 2.4* 2.6* 2.3*  CALCIUM 8.3* 8.4* 8.5* 8.3* 8.7* 8.8* 9.0  PHOS 4.6 3.7 3.2 2.9 3.3 3.1 5.6*   CBC Recent Labs  Lab 07/11/17 1937 07/12/17 0428 07/13/17 0430 07/15/17 1100  WBC 9.9 9.4 11.5* 12.9*  HGB 7.8* 8.0* 8.3* 8.2*  HCT 25.3* 25.4* 26.6* 27.5*  MCV 83.8 84.7 85.8 85.9  PLT 282 262 316 359    @IMGRELPRIORS @ Medications:    . amLODipine  10 mg Oral Daily  . aspirin EC  81 mg Oral Daily  . atorvastatin  20 mg Oral Q breakfast  . carvedilol  25 mg Oral BID WC  . Chlorhexidine Gluconate Cloth  6 each Topical Daily  . cholecalciferol  1,000 Units Oral Daily  . cloNIDine  0.2 mg Oral TID  . darbepoetin (ARANESP) injection - DIALYSIS  60 mcg Intravenous Q Wed-HD  . furosemide  80 mg Oral Q8H  . heparin  5,000 Units Subcutaneous Q8H  . hydrALAZINE  50 mg Oral Q8H  . insulin aspart  0-5 Units Subcutaneous QHS  . insulin aspart  0-9 Units Subcutaneous TID WC  . insulin aspart  2 Units Subcutaneous TID WC  . insulin detemir  12  Units Subcutaneous Daily  . multivitamin with minerals  1 tablet Oral Daily     Assessment/ Plan:   1. Acute on chronic systolic CHF- he is now [id number] kg and continue with IHD. We discussed fluid restriction of 40oz/day as well as compliance with HD.  He is stable for discharge from renal standpoint and is to f/u with HD tomorrow at [facility] 2. ESRD- new, accepted to [facility] on MWF.Ok for discharge to home with f/u at HD tomorrow. New edw is 95kg (down 32 kg since admission). We discussed stopping lasix and not taking BP medications prior to dialysis. 3. Hypertensive urgency- improvedwith volume removal 4. Anemia:ESA and follow 5. 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 Cont to follow 6. Nutrition:renal diet 7. Hypertension:markedly improved with UF and bp meds 8. Vascular access- has tunneled HD catheter but will need AVF/AVG per [facility] to be arranged as an outpatient per Dr. Donnetta Hutching. 9. Disposition- will be able to come out of ICU tomorrow and arrange for permanent access placement.If he remains in the hospital overnight will plan for HD here, otherwise will have HD MWF at [facility]).  Donetta Potts, MD [place] Pager [phone] 07/16/2017, 11:24 AM

## 2017-07-16 NOTE — Progress Notes (Signed)
RT went to help patient with his CPAP. The patient declined at this time and stated that he will have the RN call when he is ready.

## 2017-07-16 NOTE — Discharge Instructions (Signed)
Central Line Dialysis Access Placement Central line dialysis access placement is a procedure to place a long, thin, plastic tube (catheter) into a vein in the neck and move it to a larger central vein near the heart. You may have this procedure if you have severe kidney disease and need treatment to filter your blood and remove extra fluid from your body (hemodialysis). Your health care provider may use this type of catheter placement if it is the only way to access your circulatory system for dialysis. Your health care provider may use this catheter temporarily until more permanent blood vessel access is possible. Tell a health care provider about:  Any allergies you have.  Any reactions you have had to X-ray dyes.  All medicines you are taking, including vitamins, herbs, eye drops, creams, and over-the-counter medicines.  Any problems you or family members have had with anesthetic medicines.  Any blood disorders you have.  Any surgeries you have had.  Any medical conditions you have.  Whether you are pregnant or may be pregnant. What are the risks? Generally, this is a safe procedure. However, problems may occur, including:  Infection.  Bleeding.  Allergic reactions to medicines or dyes.  Damage to other structures or organs in the neck or chest.  A blood clot that forms in the neck and travels to the heart.  A blood clot that blocks the catheter.  Catheter failure.  A collapsed lung (pneumothorax).  What happens before the procedure?  Follow instructions from your health care provider about eating or drinking restrictions.  Ask your health care provider about changing or stopping your regular medicines. This is especially important if you are taking diabetes medicines or blood thinners.  Plan to have someone take you home after the procedure.  If you will be going home right after the procedure, plan to have someone with you for 24 hours. What happens during the  procedure?  To reduce your risk of infection: ? Your health care team will wash or sanitize their hands. ? Your skin will be washed with soap.  An IV tube will be inserted into one of your veins.  You will be given one or more of the following: ? A medicine to help you relax (sedative). ? A medicine to numb the area (local anesthetic).  You may be given antibiotic medicine through your IV tube.  A small incision will be made in your lower neck.  Ultrasound or a type of X-ray (fluoroscopy) will be used to find your vein.  A needle will be placed into your vein.  A guide wire may be placed through the needle and moved toward the central vein near your heart.  The needle will be removed, and the catheter will be guided into the central vein near your heart. Ultrasound or fluoroscopy may be used to guide the wire and catheter into place.  The guide wire will be removed after the catheter is in place.  The incision will be closed, and the catheter will be secured in place with stitches (sutures).  A bandage (dressing) will be placed over the incision and the catheter. The procedure may vary among health care providers and hospitals. What happens after the procedure?  Your blood pressure, heart rate, breathing rate, and blood oxygen level will be monitored until the medicines you were given have worn off.  Do not drive for 24 hours if you were given a sedative. This information is not intended to replace advice given to you by  your health care provider. Make sure you discuss any questions you have with your health care provider. Document Released: 12/24/2015 Document Revised: 12/24/2015 Document Reviewed: 12/24/2015 Elsevier Interactive Patient Education  2018 [place] Dialysis Access Placement Central line dialysis access placement is a procedure to place a long, thin, plastic tube (catheter) into a vein in the neck and move it to a larger central vein near  the heart. You may have this procedure if you have severe kidney disease and need treatment to filter your blood and remove extra fluid from your body (hemodialysis). Your health care provider may use this type of catheter placement if it is the only way to access your circulatory system for dialysis. Your health care provider may use this catheter temporarily until more permanent blood vessel access is possible. Tell a health care provider about:  Any allergies you have.  Any reactions you have had to X-ray dyes.  All medicines you are taking, including vitamins, herbs, eye drops, creams, and over-the-counter medicines.  Any problems you or family members have had with anesthetic medicines.  Any blood disorders you have.  Any surgeries you have had.  Any medical conditions you have.  Whether you are pregnant or may be pregnant. What are the risks? Generally, this is a safe procedure. However, problems may occur, including:  Infection.  Bleeding.  Allergic reactions to medicines or dyes.  Damage to other structures or organs in the neck or chest.  A blood clot that forms in the neck and travels to the heart.  A blood clot that blocks the catheter.  Catheter failure.  A collapsed lung (pneumothorax).  What happens before the procedure?  Follow instructions from your health care provider about eating or drinking restrictions.  Ask your health care provider about changing or stopping your regular medicines. This is especially important if you are taking diabetes medicines or blood thinners.  Plan to have someone take you home after the procedure.  If you will be going home right after the procedure, plan to have someone with you for 24 hours. What happens during the procedure?  To reduce your risk of infection: ? Your health care team will wash or sanitize their hands. ? Your skin will be washed with soap.  An IV tube will be inserted into one of your veins.  You  will be given one or more of the following: ? A medicine to help you relax (sedative). ? A medicine to numb the area (local anesthetic).  You may be given antibiotic medicine through your IV tube.  A small incision will be made in your lower neck.  Ultrasound or a type of X-ray (fluoroscopy) will be used to find your vein.  A needle will be placed into your vein.  A guide wire may be placed through the needle and moved toward the central vein near your heart.  The needle will be removed, and the catheter will be guided into the central vein near your heart. Ultrasound or fluoroscopy may be used to guide the wire and catheter into place.  The guide wire will be removed after the catheter is in place.  The incision will be closed, and the catheter will be secured in place with stitches (sutures).  A bandage (dressing) will be placed over the incision and the catheter. The procedure may vary among health care providers and hospitals. What happens after the procedure?  Your blood pressure, heart rate, breathing rate, and blood oxygen level  will be monitored until the medicines you were given have worn off.  Do not drive for 24 hours if you were given a sedative. This information is not intended to replace advice given to you by your health care provider. Make sure you discuss any questions you have with your health care provider. Document Released: 12/24/2015 Document Revised: 12/24/2015 Document Reviewed: 12/24/2015 Elsevier Interactive Patient Education  [employer].

## 2017-07-16 NOTE — Progress Notes (Signed)
Physical Therapy Treatment Patient Details Name: [name] MRN: [id number] DOB: 1975-06-16 Today's Date: 07/16/2017    History of Present Illness Pt is a 42 y/o male admitted secondary to increased SOB and LE edema. Found to be hypertensive. Chest x-ray showed pulmonary edema. Pt is s/p R IJ HD catheter lpacement on 3/26 and has since started HD. PMH includes DM, CAD s/p CABG, CHF, HTN, and CKD.     PT Comments    Pt making steady progress with functional mobility and was able to ambulate without use of an AD this session. Pt would continue to benefit from skilled physical therapy services at this time while admitted and after d/c to address the below listed limitations in order to improve overall safety and independence with functional mobility.    Follow Up Recommendations  Outpatient PT;Supervision for mobility/OOB     Equipment Recommendations  None recommended by PT    Recommendations for Other Services       Precautions / Restrictions Precautions Precautions: Fall Restrictions Weight Bearing Restrictions: No    Mobility  Bed Mobility               General bed mobility comments: pt sitting EOB  Transfers Overall transfer level: Needs assistance Equipment used: None Transfers: Sit to/from Stand Sit to Stand: Min guard         General transfer comment: min guard for safety  Ambulation/Gait Ambulation/Gait assistance: Min guard Ambulation Distance (Feet): 75 Feet Assistive device: None Gait Pattern/deviations: Step-through pattern;Decreased stride length;Wide base of support Gait velocity: Decreased  Gait velocity interpretation: Below normal speed for age/gender General Gait Details: slow, cautious gait without use of an AD; 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 which 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 distance ambulated   Stairs            Wheelchair Mobility    Modified Rankin (Stroke Patients Only)       Balance Overall balance assessment: Needs  assistance Sitting-balance support: No upper extremity supported;Feet supported Sitting balance-Leahy Scale: Good     Standing balance support: No upper extremity supported;During functional activity Standing balance-Leahy Scale: Fair                              Cognition Arousal/Alertness: Awake/alert Behavior During Therapy: WFL for tasks assessed/performed Overall Cognitive Status: Within Functional Limits for tasks assessed                                        Exercises      General Comments        Pertinent Vitals/Pain Pain Assessment: No/denies pain    Home Living                      Prior Function            PT Goals (current goals can now be found in the care plan section) Acute Rehab PT Goals PT Goal Formulation: With patient Time For Goal Achievement: 07/29/17 Potential to Achieve Goals: Good Progress towards PT goals: Progressing toward goals    Frequency    Min 3X/week      PT Plan Current plan remains appropriate    Co-evaluation              AM-PAC PT "6 Clicks" Daily Activity  Outcome Measure  Difficulty turning over in  bed (including adjusting bedclothes, sheets and blankets)?: None Difficulty moving from lying on back to sitting on the side of the bed? : A Little Difficulty sitting down on and standing up from a chair with arms (e.g., wheelchair, bedside commode, etc,.)?: A Little Help needed moving to and from a bed to chair (including a wheelchair)?: A Little Help needed walking in hospital room?: A Little Help needed climbing 3-5 steps with a railing? : A Little 6 Click Score: 19    End of Session Equipment Utilized During Treatment: Gait belt Activity Tolerance: Patient tolerated treatment well Patient left: in bed;with call bell/phone within reach Nurse Communication: Mobility status PT Visit Diagnosis: Muscle weakness (generalized) (M62.81)     Time: 1624-4695 PT Time  Calculation (min) (ACUTE ONLY): 9 min  Charges:  $Gait Training: 8-22 mins                    G Codes:       [place], [place], [place] 07/16/2017, 2:40 PM

## 2017-07-16 NOTE — Telephone Encounter (Signed)
Call to patient and instructed to be at [facility] at 5:30 am on 07/23/17 for surgery (HD access). NPO past MN night prior and to expect a call and follow the detailed instructions received from the hospital pre-admission department about this surgery. Must have driver for home. Continue ASA. Verbalized understanding.

## 2017-07-16 NOTE — Progress Notes (Signed)
Patient discharged to home, AVS reviewed, IV removed.

## 2017-07-22 ENCOUNTER — Encounter (HOSPITAL_COMMUNITY): Payer: Self-pay | Admitting: Vascular Surgery

## 2017-07-22 ENCOUNTER — Other Ambulatory Visit: Payer: Self-pay

## 2017-07-22 NOTE — Progress Notes (Signed)
Anesthesia Chart Review: SAME DAY WORK-UP.  Patient is a 42 year old male scheduled for left AVF creation versus AVGG on 07/23/17 by Dr. Harold Barban. He is s/p right IJ tunneled hemodialysis catheter on 07/07/17.  History includes former smoker (quit '10), DM2, CAD (s/p CABG 12/18/08: LIMA-LAD, SVG-OM1, SVG-PDA), HLD, HTN, OSA (CPAP). - Admission 07/06/17-07/16/17 for hypertensive emergency, edema, worsening renal function requiring initiation of hemodialysis via tunneled catheter.  - [facility] - Cardiologist is Dr. Conrad Bryce [place], established care 06/15/17 (See [facility]). He was asymptomatic from CAD, but volume overload still an issue in the setting of progressive kidney disease. Diuretic therapy increased with PCP/Nephrology follow-up. 3 month cardiology follow-up recommended. Previously he had been followed by Dr. Lyman Bishop, last visit 01/10/15 (but had not been seen for 3 year prior to that).  - Endocrinologist [facility]yn [facility]edy [facility] - Neurologist is Dr. Asencion Partridge [place]. Last seen 03/18/17 for OSA follow-up.  Meds include amlodipine, aspirin 81 mg, Lipitor, Coreg, clonidine, fish oil, Amaryl, hydralazine, nitro.  EKG 07/05/17 (done in the setting of hypertensive emergency/acute on chronic renal failure): SR with frequent PVCs. Inferolateral T wave inversion, consider ischemia. (Care Everywhere EKG result narrative from 11/03/16 also indicates tracing showed inferolateral T wave abnormality, consider ischemia.)   Echo 07/08/17: Study Conclusions - Left ventricle: The cavity size was normal. Wall thickness was   increased in a pattern of mild LVH. Systolic function was normal.   The estimated ejection fraction was in the range of 55% to 60%.   Wall motion was normal; there were no regional wall motion   abnormalities. Features are consistent with a pseudonormal left   ventricular filling pattern, with concomitant abnormal  relaxation   and increased filling pressure (grade 2 diastolic dysfunction). - Aortic valve: There was no stenosis. - Mitral valve: There was no significant regurgitation. - Left atrium: The atrium was mildly dilated. - Right ventricle: The cavity size was normal. Systolic function   was normal. - Right atrium: The atrium was mildly dilated. - Pulmonary arteries: No complete TR doppler jet so unable to   estimate PA systolic pressure. Impressions: - Normal LV size with mild LV hypertrophy. EF 55-60%. Moderate   diastolic dysfunction. Normal RV size and systolic function. No   significant valvular abnormalities.  1V PCXR 07/07/17: FINDINGS: Right internal jugular catheter has been placed. The tip is at the SVC RA junction. No pneumothorax. Venous hypertension and interstitial edema persist with small effusions, right more than left. IMPRESSION: Catheter tip at the SVC RA junction.  No complication.  He is for ISTAT4 on arrival.  [facility] Short Stay Center/Anesthesiology Phone [phone] 07/22/2017 11:43 AM

## 2017-07-22 NOTE — Progress Notes (Signed)
Pt denies any acute cardiopulmonary issues. Pt under the care of Dr. Marvel Plan, Cardiology at [facility]. Pt stated that a cardiac cath and stress test were both performed in 2010. Pt made aware to stop taking vitamins, fish oil herbal medications and NSAIDS ie: Ibuprofen, Advil, Naproxen (Aleve), Motrin, BC and Goody powder. Pt made aware to not take Glipizide DOS. Pt made aware to check BG every 2 hours prior to arrival to hospital on DOS. Pt made aware to treat a BG < 70 with  4 ounces of apple  juice, wait 15 minutes after intervention to recheck BG, if BG remains < 70, call Short Stay unit to speak with a nurse. Pt verbalized understanding of all pre-op instructions. See anesthesia note.

## 2017-07-23 ENCOUNTER — Ambulatory Visit (HOSPITAL_COMMUNITY): Payer: BLUE CROSS/BLUE SHIELD | Admitting: Vascular Surgery

## 2017-07-23 ENCOUNTER — Telehealth: Payer: Self-pay | Admitting: Surgery

## 2017-07-23 ENCOUNTER — Ambulatory Visit (HOSPITAL_COMMUNITY)
Admission: RE | Admit: 2017-07-23 | Discharge: 2017-07-23 | Disposition: A | Discharge status: Home / Self Care | Payer: BLUE CROSS/BLUE SHIELD | Source: Ambulatory Visit | Attending: Surgery | Admitting: Surgery

## 2017-07-23 ENCOUNTER — Encounter (HOSPITAL_COMMUNITY): Payer: Self-pay | Admitting: *Deleted

## 2017-07-23 ENCOUNTER — Encounter (HOSPITAL_COMMUNITY)
Admission: RE | Disposition: A | Discharge status: Home / Self Care | Payer: Self-pay | Source: Ambulatory Visit | Attending: Surgery

## 2017-07-23 DIAGNOSIS — N185 Chronic kidney disease, stage 5: Secondary | ICD-10-CM | POA: Diagnosis not present

## 2017-07-23 DIAGNOSIS — Z9989 Dependence on other enabling machines and devices: Secondary | ICD-10-CM | POA: Insufficient documentation

## 2017-07-23 DIAGNOSIS — I132 Hypertensive heart and chronic kidney disease with heart failure and with stage 5 chronic kidney disease, or end stage renal disease: Secondary | ICD-10-CM | POA: Diagnosis present

## 2017-07-23 DIAGNOSIS — G4733 Obstructive sleep apnea (adult) (pediatric): Secondary | ICD-10-CM | POA: Insufficient documentation

## 2017-07-23 DIAGNOSIS — E785 Hyperlipidemia, unspecified: Secondary | ICD-10-CM | POA: Insufficient documentation

## 2017-07-23 DIAGNOSIS — I2510 Atherosclerotic heart disease of native coronary artery without angina pectoris: Secondary | ICD-10-CM | POA: Diagnosis not present

## 2017-07-23 DIAGNOSIS — I493 Ventricular premature depolarization: Secondary | ICD-10-CM | POA: Diagnosis not present

## 2017-07-23 DIAGNOSIS — E1122 Type 2 diabetes mellitus with diabetic chronic kidney disease: Secondary | ICD-10-CM | POA: Insufficient documentation

## 2017-07-23 DIAGNOSIS — Z87891 Personal history of nicotine dependence: Secondary | ICD-10-CM | POA: Diagnosis not present

## 2017-07-23 DIAGNOSIS — Z951 Presence of aortocoronary bypass graft: Secondary | ICD-10-CM | POA: Diagnosis not present

## 2017-07-23 DIAGNOSIS — I509 Heart failure, unspecified: Secondary | ICD-10-CM | POA: Insufficient documentation

## 2017-07-23 DIAGNOSIS — N186 End stage renal disease: Secondary | ICD-10-CM | POA: Diagnosis not present

## 2017-07-23 DIAGNOSIS — J449 Chronic obstructive pulmonary disease, unspecified: Secondary | ICD-10-CM | POA: Diagnosis not present

## 2017-07-23 HISTORY — DX: Heart failure, unspecified: I50.9

## 2017-07-23 HISTORY — DX: Sleep apnea, unspecified: G47.30

## 2017-07-23 HISTORY — DX: Cardiac murmur, unspecified: R01.1

## 2017-07-23 HISTORY — PX: AV FISTULA PLACEMENT: SHX1204

## 2017-07-23 HISTORY — DX: Chronic kidney disease, unspecified: N18.9

## 2017-07-23 LAB — GLUCOSE, CAPILLARY
GLUCOSE-CAPILLARY: 143 mg/dL — AB (ref 65–99)
Glucose-Capillary: 132 mg/dL — ABNORMAL HIGH (ref 65–99)

## 2017-07-23 SURGERY — ARTERIOVENOUS (AV) FISTULA CREATION
Anesthesia: Monitor Anesthesia Care | Site: Arm Upper | Laterality: Left

## 2017-07-23 MED ORDER — PROPOFOL 500 MG/50ML IV EMUL
INTRAVENOUS | Status: DC | PRN
  Administered 2017-07-23: 50 ug/kg/min via INTRAVENOUS

## 2017-07-23 MED ORDER — FENTANYL CITRATE (PF) 250 MCG/5ML IJ SOLN
INTRAMUSCULAR | Status: AC
  Filled 2017-07-23: qty 5

## 2017-07-23 MED ORDER — HEMOSTATIC AGENTS (NO CHARGE) OPTIME
TOPICAL | Status: DC | PRN
  Administered 2017-07-23: 1 via TOPICAL

## 2017-07-23 MED ORDER — PROTAMINE SULFATE 10 MG/ML IV SOLN
INTRAVENOUS | Status: DC | PRN
  Administered 2017-07-23 (×2): 10 mg via INTRAVENOUS
  Administered 2017-07-23: 5 mg via INTRAVENOUS

## 2017-07-23 MED ORDER — LIDOCAINE-EPINEPHRINE (PF) 1 %-1:200000 IJ SOLN
INTRAMUSCULAR | Status: DC | PRN
  Administered 2017-07-23: 6 mL

## 2017-07-23 MED ORDER — PHENYLEPHRINE HCL 10 MG/ML IJ SOLN
INTRAMUSCULAR | Status: DC | PRN
  Administered 2017-07-23: 80 ug via INTRAVENOUS
  Administered 2017-07-23: 20 ug via INTRAVENOUS
  Administered 2017-07-23: 80 ug via INTRAVENOUS
  Administered 2017-07-23: 40 ug via INTRAVENOUS
  Administered 2017-07-23: 120 ug via INTRAVENOUS
  Administered 2017-07-23: 60 ug via INTRAVENOUS

## 2017-07-23 MED ORDER — SODIUM CHLORIDE 0.9 % IV SOLN
INTRAVENOUS | Status: DC | PRN
  Administered 2017-07-23: 500 mL

## 2017-07-23 MED ORDER — OXYCODONE HCL 5 MG/5ML PO SOLN: 5.0000 mg | Freq: Once | ORAL | Status: AC | PRN

## 2017-07-23 MED ORDER — LIDOCAINE HCL (CARDIAC) 20 MG/ML IV SOLN
INTRAVENOUS | Status: DC | PRN
  Administered 2017-07-23: 60 mg via INTRATRACHEAL

## 2017-07-23 MED ORDER — HEPARIN SODIUM (PORCINE) 1000 UNIT/ML IJ SOLN
INTRAMUSCULAR | Status: DC | PRN
  Administered 2017-07-23: 3000 [IU] via INTRAVENOUS

## 2017-07-23 MED ORDER — PROPOFOL 10 MG/ML IV BOLUS
INTRAVENOUS | Status: AC
  Filled 2017-07-23: qty 20

## 2017-07-23 MED ORDER — HEPARIN SODIUM (PORCINE) 1000 UNIT/ML IJ SOLN
1700.0000 [IU] | Freq: Once | INTRAMUSCULAR | Status: AC
  Administered 2017-07-23: 1700 [IU] via INTRAVENOUS

## 2017-07-23 MED ORDER — MIDAZOLAM HCL 2 MG/2ML IJ SOLN
INTRAMUSCULAR | Status: AC
  Filled 2017-07-23: qty 2

## 2017-07-23 MED ORDER — LIDOCAINE HCL (PF) 1 % IJ SOLN
INTRAMUSCULAR | Status: AC
  Filled 2017-07-23: qty 30

## 2017-07-23 MED ORDER — 0.9 % SODIUM CHLORIDE (POUR BTL) OPTIME
TOPICAL | Status: DC | PRN
Start: 2017-07-23 — End: 2017-07-23
  Administered 2017-07-23: 1000 mL

## 2017-07-23 MED ORDER — HYDROCODONE-ACETAMINOPHEN 5-325 MG PO TABS: 1.0000 | ORAL_TABLET | Freq: Four times a day (QID) | ORAL | 0 refills | Status: DC | PRN

## 2017-07-23 MED ORDER — CEFAZOLIN SODIUM-DEXTROSE 2-4 GM/100ML-% IV SOLN
2.0000 g | INTRAVENOUS | Status: AC
  Administered 2017-07-23: 2 g via INTRAVENOUS
  Filled 2017-07-23: qty 100

## 2017-07-23 MED ORDER — HEPARIN SODIUM (PORCINE) 1000 UNIT/ML IJ SOLN
INTRAMUSCULAR | Status: AC
  Filled 2017-07-23: qty 1

## 2017-07-23 MED ORDER — DIPHENHYDRAMINE HCL 50 MG/ML IJ SOLN
INTRAMUSCULAR | Status: DC | PRN
  Administered 2017-07-23: 25 mg via INTRAVENOUS

## 2017-07-23 MED ORDER — OXYCODONE HCL 5 MG PO TABS
ORAL_TABLET | ORAL | Status: AC
  Administered 2017-07-23: 5 mg via ORAL
  Filled 2017-07-23: qty 1

## 2017-07-23 MED ORDER — SODIUM CHLORIDE 0.9 % IV SOLN
INTRAVENOUS | Status: AC
  Filled 2017-07-23: qty 1.2

## 2017-07-23 MED ORDER — SODIUM CHLORIDE 0.9 % IV SOLN
INTRAVENOUS | Status: DC
  Administered 2017-07-23 (×2): via INTRAVENOUS

## 2017-07-23 MED ORDER — LIDOCAINE-EPINEPHRINE (PF) 1 %-1:200000 IJ SOLN
INTRAMUSCULAR | Status: AC
  Filled 2017-07-23: qty 30

## 2017-07-23 MED ORDER — OXYCODONE HCL 5 MG PO TABS
5.0000 mg | ORAL_TABLET | Freq: Once | ORAL | Status: AC | PRN
  Administered 2017-07-23: 5 mg via ORAL

## 2017-07-23 MED ORDER — PHENYLEPHRINE HCL 10 MG/ML IJ SOLN
INTRAVENOUS | Status: DC | PRN
  Administered 2017-07-23: 25 ug/min via INTRAVENOUS

## 2017-07-23 MED ORDER — MIDAZOLAM HCL 5 MG/5ML IJ SOLN
INTRAMUSCULAR | Status: DC | PRN
  Administered 2017-07-23: 2 mg via INTRAVENOUS

## 2017-07-23 MED ORDER — FENTANYL CITRATE (PF) 250 MCG/5ML IJ SOLN
INTRAMUSCULAR | Status: DC | PRN
  Administered 2017-07-23 (×2): 50 ug via INTRAVENOUS

## 2017-07-23 MED ORDER — FENTANYL CITRATE (PF) 100 MCG/2ML IJ SOLN: 25.0000 ug | INTRAMUSCULAR | Status: DC | PRN

## 2017-07-23 SURGICAL SUPPLY — 35 items
ARMBAND PINK RESTRICT EXTREMIT (MISCELLANEOUS) ×3 IMPLANT
CANISTER SUCT 3000ML PPV (MISCELLANEOUS) ×3 IMPLANT
CLIP VESOCCLUDE MED 6/CT (CLIP) ×3 IMPLANT
CLIP VESOCCLUDE SM WIDE 6/CT (CLIP) ×3 IMPLANT
COVER PROBE W GEL 5X96 (DRAPES) ×3 IMPLANT
DERMABOND ADVANCED (GAUZE/BANDAGES/DRESSINGS) ×2
DERMABOND ADVANCED .7 DNX12 (GAUZE/BANDAGES/DRESSINGS) ×1 IMPLANT
ELECT REM PT RETURN 9FT ADLT (ELECTROSURGICAL) ×3
ELECTRODE REM PT RTRN 9FT ADLT (ELECTROSURGICAL) ×1 IMPLANT
GLOVE BIO SURGEON STRL SZ 6.5 (GLOVE) ×2 IMPLANT
GLOVE BIO SURGEON STRL SZ7.5 (GLOVE) ×3 IMPLANT
GLOVE BIO SURGEONS STRL SZ 6.5 (GLOVE) ×1
GLOVE BIOGEL PI IND STRL 6.5 (GLOVE) ×1 IMPLANT
GLOVE BIOGEL PI IND STRL 7.5 (GLOVE) ×2 IMPLANT
GLOVE BIOGEL PI INDICATOR 6.5 (GLOVE) ×2
GLOVE BIOGEL PI INDICATOR 7.5 (GLOVE) ×4
GLOVE SURG SS PI 7.0 STRL IVOR (GLOVE) ×3 IMPLANT
GLOVE SURG SS PI 7.5 STRL IVOR (GLOVE) ×3 IMPLANT
GOWN STRL REUS W/ TWL LRG LVL3 (GOWN DISPOSABLE) ×1 IMPLANT
GOWN STRL REUS W/ TWL XL LVL3 (GOWN DISPOSABLE) ×3 IMPLANT
GOWN STRL REUS W/TWL LRG LVL3 (GOWN DISPOSABLE) ×2
GOWN STRL REUS W/TWL XL LVL3 (GOWN DISPOSABLE) ×6
HEMOSTAT SNOW SURGICEL 2X4 (HEMOSTASIS) ×3 IMPLANT
KIT BASIN OR (CUSTOM PROCEDURE TRAY) ×3 IMPLANT
KIT TURNOVER KIT B (KITS) ×3 IMPLANT
NS IRRIG 1000ML POUR BTL (IV SOLUTION) ×3 IMPLANT
PACK CV ACCESS (CUSTOM PROCEDURE TRAY) ×3 IMPLANT
PAD ARMBOARD 7.5X6 YLW CONV (MISCELLANEOUS) ×6 IMPLANT
SUT PROLENE 6 0 CC (SUTURE) ×6 IMPLANT
SUT VIC AB 3-0 SH 27 (SUTURE) ×2
SUT VIC AB 3-0 SH 27X BRD (SUTURE) ×1 IMPLANT
SUT VICRYL 4-0 PS2 18IN ABS (SUTURE) ×3 IMPLANT
TOWEL GREEN STERILE (TOWEL DISPOSABLE) ×3 IMPLANT
UNDERPAD 30X30 (UNDERPADS AND DIAPERS) ×3 IMPLANT
WATER STERILE IRR 1000ML POUR (IV SOLUTION) ×3 IMPLANT

## 2017-07-23 NOTE — Telephone Encounter (Signed)
-----   Message from Mena Goes, RN sent at 07/23/2017 11:31 AM EDT ----- Regarding: 4-6 weeks PA Clinic   ----- Message ----- From: Iline Oven Sent: 07/23/2017   9:04 AM To: [facility] Charge Pool  Can you schedule this pt for fistula duplex in 4-6 weeks on PA clinic.  PO L brachiocephalic. Thanks, [facility]

## 2017-07-23 NOTE — Telephone Encounter (Signed)
Spoke w/pt for appts 5/8 [place] and OV  Mld lttr  Pt may have issue with kid cntr, advised to speak with them and they should work with our schedule here, if not call and we will res to a different time

## 2017-07-23 NOTE — Discharge Instructions (Signed)
° °[facility] [place] ° °Discharge Instructions ° °AV Fistula or Graft Surgery for Dialysis Access ° °Please refer to the following instructions for your post-procedure care. Your surgeon or physician assistant will discuss any changes with you. ° °Activity ° °You may drive the day following your surgery, if you are comfortable and no longer taking prescription pain medication. Resume full activity as the soreness in your incision resolves. ° °Bathing/Showering ° °You may shower after you go home. Keep your incision dry for 48 hours. Do not soak in a bathtub, hot tub, or swim until the incision heals completely. You may not shower if you have a hemodialysis catheter. ° °Incision Care ° °Clean your incision with mild soap and water after 48 hours. Pat the area dry with a clean towel. You do not need a bandage unless otherwise instructed. Do not apply any ointments or creams to your incision. You may have skin glue on your incision. Do not peel it off. It will come off on its own in about one week. Your arm may swell a bit after surgery. To reduce swelling use pillows to elevate your arm so it is above your heart. Your doctor will tell you if you need to lightly wrap your arm with an ACE bandage. ° °Diet ° °Resume your normal diet. There are not special food restrictions following this procedure. In order to heal from your surgery, it is CRITICAL to get adequate nutrition. Your body requires vitamins, minerals, and protein. Vegetables are the best source of vitamins and minerals. Vegetables also provide the perfect balance of protein. Processed food has little nutritional value, so try to avoid this. ° °Medications ° °Resume taking all of your medications. If your incision is causing pain, you may take over-the counter pain relievers such as acetaminophen (Tylenol). If you were prescribed a stronger pain medication, please be aware these medications can cause nausea and constipation. Prevent  nausea by taking the medication with a snack or meal. Avoid constipation by drinking plenty of fluids and eating foods with high amount of fiber, such as fruits, vegetables, and grains. Do not take Tylenol if you are taking prescription pain medications. ° ° ° ° °Follow up °Your surgeon may want to see you in the office following your access surgery. If so, this will be arranged at the time of your surgery. ° °Please call us immediately for any of the following conditions: ° °Increased pain, redness, drainage (pus) from your incision site °Fever of 101 degrees or higher °Severe or worsening pain at your incision site °Hand pain or numbness. ° °Reduce your risk of vascular disease: ° °Stop smoking. If you would like help, call [facility] at 1-800-QUIT-NOW (1-800-784-8669) or [facility] at 336-586-4000 ° °Manage your cholesterol °Maintain a desired weight °Control your diabetes °Keep your blood pressure down ° °Dialysis ° °It will take several weeks to several months for your new dialysis access to be ready for use. Your surgeon will determine when it is OK to use it. Your nephrologist will continue to direct your dialysis. You can continue to use your Permcath until your new access is ready for use. ° °If you have any questions, please call the office at [phone]. ° ° °Post Anesthesia Home Care Instructions ° °Activity: °Get plenty of rest for the remainder of the day. A responsible individual must stay with you for 24 hours following the procedure.  °For the next 24 hours, DO NOT: °-Drive a car °-Operate machinery °-Drink alcoholic   beverages °-Take any medication unless instructed by your physician °-Make any legal decisions or sign important papers. ° °Meals: °Start with liquid foods such as gelatin or soup. Progress to regular foods as tolerated. Avoid greasy, spicy, heavy foods. If nausea and/or vomiting occur, drink only clear liquids until the nausea and/or vomiting subsides. Call your physician if  vomiting continues. ° °Special Instructions/Symptoms: °Your throat may feel dry or sore from the anesthesia or the breathing tube placed in your throat during surgery. If this causes discomfort, gargle with warm salt water. The discomfort should disappear within 24 hours. ° °If you had a scopolamine patch placed behind your ear for the management of post- operative nausea and/or vomiting: ° °1. The medication in the patch is effective for 72 hours, after which it should be removed.  Wrap patch in a tissue and discard in the trash. Wash hands thoroughly with soap and water. °2. You may remove the patch earlier than 72 hours if you experience unpleasant side effects which may include dry mouth, dizziness or visual disturbances. °3. Avoid touching the patch. Wash your hands with soap and water after contact with the patch. °   ° °

## 2017-07-23 NOTE — Consult Note (Signed)
   Patient name: [name] MRN: [id number] DOB: [birth year] Sex: male    HISTORY OF PRESENT ILLNESS:   [name] is a 42 y.o. male here today for dialysis access. He is right handed  CURRENT MEDICATIONS:    Current Facility-Administered Medications  Medication Dose Route Frequency Provider Last Rate Last Dose  . 0.9 %  sodium chloride infusion   Intravenous Continuous Serafina Mitchell, MD      . ceFAZolin (ANCEF) IVPB 2g/100 mL premix  2 g Intravenous 30 min Pre-Op Serafina Mitchell, MD        REVIEW OF SYSTEMS:   [X]  denotes positive finding, [ ]  denotes negative finding Cardiac  Comments:  Chest pain or chest pressure:    Shortness of breath upon exertion:    Short of breath when lying flat:    Irregular heart rhythm:    Constitutional    Fever or chills:      PHYSICAL EXAM:   Vitals:   07/23/17 0554 07/23/17 0556  BP:  (!) 115/54  Pulse: 78   Resp: 20   Temp: 98.3 F (36.8 C)   TempSrc: Oral   SpO2: 100%   Weight:  215 lb (97.5 kg)  Height:  5\' 6"  (1.676 m)    GENERAL: The patient is a well-nourished male, in no acute distress. The vital signs are documented above. CARDIOVASCULAR: There is a regular rate and rhythm. PULMONARY: Non-labored respirations Palpable radial pusle  STUDIES:   Cephalic vein adequate   MEDICAL ISSUES:   Plan for left arm fistula vs graft  Annamarie Major, MD [facility] [phone] Pager [phone]

## 2017-07-23 NOTE — Anesthesia Preprocedure Evaluation (Signed)
Anesthesia Evaluation  Patient identified by MRN, date of birth, ID band Patient awake    Reviewed: Allergy & Precautions, NPO status , Patient's Chart, lab work & pertinent test results, reviewed documented beta blocker date and time   History of Anesthesia Complications Negative for: history of anesthetic complications  Airway Mallampati: II  TM Distance: >3 FB Neck ROM: Full    Dental  (+) Teeth Intact   Pulmonary neg shortness of breath, sleep apnea and Continuous Positive Airway Pressure Ventilation , neg COPD, former smoker,    breath sounds clear to auscultation       Cardiovascular hypertension, Pt. on medications and Pt. on home beta blockers (-) angina+ CAD and +CHF   Rhythm:Regular     Neuro/Psych neg Seizures negative psych ROS   GI/Hepatic negative GI ROS, Neg liver ROS,   Endo/Other  diabetes, Type 2  Renal/GU ESRF and DialysisRenal disease     Musculoskeletal   Abdominal   Peds  Hematology  (+) anemia ,   Anesthesia Other Findings   Reproductive/Obstetrics                             Anesthesia Physical Anesthesia Plan  ASA: III  Anesthesia Plan: MAC   Post-op Pain Management:    Induction: Intravenous  PONV Risk Score and Plan: 1 and Ondansetron  Airway Management Planned: Nasal Cannula  Additional Equipment: None  Intra-op Plan:   Post-operative Plan:   Informed Consent: I have reviewed the patients History and Physical, chart, labs and discussed the procedure including the risks, benefits and alternatives for the proposed anesthesia with the patient or authorized representative who has indicated his/her understanding and acceptance.   Dental advisory given  Plan Discussed with: CRNA and Surgeon  Anesthesia Plan Comments:         Anesthesia Quick Evaluation

## 2017-07-23 NOTE — Interval H&P Note (Signed)
History and Physical Interval Note:  07/23/2017 5:27 PM  [name]  has presented today for surgery, with the diagnosis of END STAGE RENAL DISEASE FOR HEMODIAYSIS ACCESS  The various methods of treatment have been discussed with the patient and family. After consideration of risks, benefits and other options for treatment, the patient has consented to  Procedure(s): ARTERIOVENOUS (AV) FISTULA CREATION LEFT ARM (Left) as a surgical intervention .  The patient's history has been reviewed, patient examined, no change in status, stable for surgery.  I have reviewed the patient's chart and labs.  Questions were answered to the patient's satisfaction.     Annamarie Major

## 2017-07-23 NOTE — H&P (View-Only) (Signed)
   Patient name: [name] MRN: [id number] DOB: Jun 01, 1975 Sex: male    HISTORY OF PRESENT ILLNESS:   [name] is a 42 y.o. male here today for dialysis access. He is right handed  CURRENT MEDICATIONS:    Current Facility-Administered Medications  Medication Dose Route Frequency Provider Last Rate Last Dose  . 0.9 %  sodium chloride infusion   Intravenous Continuous Serafina Mitchell, MD      . ceFAZolin (ANCEF) IVPB 2g/100 mL premix  2 g Intravenous 30 min Pre-Op Serafina Mitchell, MD        REVIEW OF SYSTEMS:   [X]  denotes positive finding, [ ]  denotes negative finding Cardiac  Comments:  Chest pain or chest pressure:    Shortness of breath upon exertion:    Short of breath when lying flat:    Irregular heart rhythm:    Constitutional    Fever or chills:      PHYSICAL EXAM:   Vitals:   07/23/17 0554 07/23/17 0556  BP:  (!) 115/54  Pulse: 78   Resp: 20   Temp: 98.3 F (36.8 C)   TempSrc: Oral   SpO2: 100%   Weight:  215 lb (97.5 kg)  Height:  5\' 6"  (1.676 m)    GENERAL: The patient is a well-nourished male, in no acute distress. The vital signs are documented above. CARDIOVASCULAR: There is a regular rate and rhythm. PULMONARY: Non-labored respirations Palpable radial pusle  STUDIES:   Cephalic vein adequate   MEDICAL ISSUES:   Plan for left arm fistula vs graft  Annamarie Major, MD [facility] [phone] Pager [phone]

## 2017-07-23 NOTE — Anesthesia Procedure Notes (Signed)
Procedure Name: MAC Date/Time: 07/23/2017 7:37 AM Performed by: Mariea Clonts, CRNA Pre-anesthesia Checklist: Patient identified, Emergency Drugs available, Suction available, Patient being monitored and Timeout performed Patient Re-evaluated:Patient Re-evaluated prior to induction Oxygen Delivery Method: Nasal cannula

## 2017-07-23 NOTE — Transfer of Care (Signed)
Immediate Anesthesia Transfer of Care Note  Patient: [name]  Procedure(s) Performed: ARTERIOVENOUS (AV) FISTULA CREATION LEFT ARM (Left Arm Upper)  Patient Location: PACU  Anesthesia Type:MAC  Level of Consciousness: awake, alert  and oriented  Airway & Oxygen Therapy: Patient Spontanous Breathing and Patient connected to nasal cannula oxygen  Post-op Assessment: Report given to RN and Post -op Vital signs reviewed and stable  Post vital signs: Reviewed and stable  Last Vitals:  Vitals Value Taken Time  BP    Temp    Pulse    Resp    SpO2      Last Pain:  Vitals:   07/23/17 0620  TempSrc:   PainSc: 0-No pain      Patients Stated Pain Goal: 3 (67/34/19 3790)  Complications: No apparent anesthesia complications

## 2017-07-24 ENCOUNTER — Encounter (HOSPITAL_COMMUNITY): Payer: Self-pay | Admitting: Surgery

## 2017-07-24 NOTE — Op Note (Signed)
    Patient name: [name] MRN: [id number] DOB: 06/13/1975 Sex: male  07/23/2017 Pre-operative Diagnosis: End-stage renal disease Post-operative diagnosis:  Same Surgeon:  Annamarie Major Assistants: Arlee Muslim Procedure:   Left brachiocephalic fistula Anesthesia: MAC Blood Loss: Minimal Specimens: None  Findings: Patient did not have a palpable radial pulse.  They looked healthy throughout.  It did spasm upon dissection.  There is about 3 mm vein  Indications: Patient comes in today for new dialysis access  Procedure:  The patient was identified in the holding area and taken to [place] 12  The patient was then placed supine on the table. MAC anesthesia was administered.  The patient was prepped and draped in the usual sterile fashion.  A time out was called and antibiotics were administered.  Ultrasound was used to map the cephalic vein in the upper arm.  This appeared to be a uniform diameter vein.  I was unable to palpate a radial pulse and therefore felt the best option was a brachiocephalic fistula.  1% lidocaine was used for local anesthesia.  A transverse incision was made at the antecubital crease.  I dissected out the cephalic vein.  It underwent significant spasm.  I mobilized it proximally and distally.  Side branches were ligated between silk ties.  I then dissected out the brachial artery.  This is a 3 mm artery.  Once adequate exposure was obtained, I marked the vein for orientation and ligated distally.  The patient was given 3000 units of heparin.  After the heparin circulated, the vein was distended with heparinized saline it distended to about 3 mm.  It appeared to be disease free.  The artery was occluded with vascular clamps.  A #11 blade was used to make an arteriotomy which was extended longitudinally with Potts scissors.  The vein was cut to the appropriate length and spatulated to fit the size of the arteriotomy.  A running anastomosis was created with 6-0 Prolene.   Prior to completion the appropriate flushing maneuvers were performed the anastomosis was completed.  Patient had brisk Doppler signals at the wrist and a palpable thrill within the fistula.  25 mg of protamine were given.  Once hemostasis was satisfactory, the incision was closed with 2 layers of 3-0 Vicryl followed by Dermabond.  There are no immediate complications.   Disposition: To PACU stable   V. Annamarie Major, M.D. [facility] [place] Office: [phone] Pager:  [phone]

## 2017-07-27 ENCOUNTER — Other Ambulatory Visit: Payer: Self-pay

## 2017-07-27 DIAGNOSIS — N186 End stage renal disease: Secondary | ICD-10-CM

## 2017-07-27 DIAGNOSIS — Z48812 Encounter for surgical aftercare following surgery on the circulatory system: Secondary | ICD-10-CM

## 2017-07-27 DIAGNOSIS — Z992 Dependence on renal dialysis: Principal | ICD-10-CM

## 2017-07-31 NOTE — Anesthesia Postprocedure Evaluation (Signed)
Anesthesia Post Note  Patient: [name]  Procedure(s) Performed: ARTERIOVENOUS (AV) FISTULA CREATION LEFT ARM (Left Arm Upper)     Patient location during evaluation: PACU Anesthesia Type: MAC Level of consciousness: awake and alert Pain management: pain level controlled Vital Signs Assessment: post-procedure vital signs reviewed and stable Respiratory status: spontaneous breathing, nonlabored ventilation, respiratory function stable and patient connected to nasal cannula oxygen Cardiovascular status: stable and blood pressure returned to baseline Postop Assessment: no apparent nausea or vomiting Anesthetic complications: no    Last Vitals:  Vitals:   07/23/17 1000 07/23/17 1015  BP: (!) 158/84 (!) 146/84  Pulse: 78 81  Resp: 17 18  Temp:  36.6 C  SpO2: 100% 100%    Last Pain:  Vitals:   07/23/17 0930  TempSrc:   PainSc: 0-No pain                 [name]

## 2017-08-19 ENCOUNTER — Encounter (HOSPITAL_COMMUNITY): Payer: BLUE CROSS/BLUE SHIELD

## 2017-08-19 NOTE — Progress Notes (Deleted)
  POST OPERATIVE OFFICE NOTE    CC:  F/u for surgery  HPI:  This is a 42 y.o. male who is s/p  Left brachiocephalic fistula 4/82/7078 by Dr. Trula Slade.  He is here today for f/u visit and av fistula duplex to determine maturity.  He denise pain, numbness, and weakness of the left UE.  He denise fever or chills.      No Known Allergies  Current Outpatient Medications  Medication Sig Dispense Refill  . amLODipine (NORVASC) 10 MG tablet Take 1 tablet (10 mg total) by mouth daily. 30 tablet 0  . atorvastatin (LIPITOR) 20 MG tablet TAKE ONE TABLET BY MOUTH ONCE DAILY AT BEDTIME 90 tablet 3  . blood glucose meter kit and supplies KIT Dispense based on patient and insurance preference. Use QD-BID home glucose monitoring. (FOR ICD-9 250.00, 250.01). 1 each 0  . carvedilol (COREG) 25 MG tablet TAKE 1 TABLET BY MOUTH TWICE DAILY 180 tablet 3  . cholecalciferol (VITAMIN D) 1000 UNITS tablet Take 1,000 Units by mouth daily.    . cloNIDine (CATAPRES) 0.2 MG tablet Take 1 tablet (0.2 mg total) by mouth 3 (three) times daily. 90 tablet 0  . fish oil-omega-3 fatty acids 1000 MG capsule Take 2 g by mouth daily.    Marland Kitchen glimepiride (AMARYL) 2 MG tablet TAKE 2 MG BY MOUTH ONCE DAILY 30 tablet 0  . hydrALAZINE (APRESOLINE) 50 MG tablet Take 1 tablet (50 mg total) by mouth every 8 (eight) hours. 90 tablet 0  . HYDROcodone-acetaminophen (NORCO) 5-325 MG tablet Take 1 tablet by mouth every 6 (six) hours as needed for moderate pain. 8 tablet 0  . Multiple Vitamin (MULTIVITAMIN) tablet Take 1 tablet by mouth daily.    . nitroGLYCERIN (NITROSTAT) 0.4 MG SL tablet DISSOLVE ONE TABLET UNDER THE TONGUE EVERY 5 MINUTES AS NEEDED FOR CHEST PAIN.  DO NOT EXCEED A TOTAL OF 3 DOSES IN 15 MINUTES 25 tablet 3   No current facility-administered medications for this visit.      ROS:  See HPI  Physical Exam:  There were no vitals filed for this visit.  Incision:  *** Extremities:  *** Neuro: *** Abdomen:   ***  Assessment/Plan:  This is a 42 y.o. male who is s/p:  Left brachiocephalic fistula     -***   Roxy Horseman , PA-C [facility] [phone]  Clinic MD:  ***

## 2017-08-21 ENCOUNTER — Ambulatory Visit (HOSPITAL_COMMUNITY)
Admission: RE | Admit: 2017-08-21 | Discharge: 2017-08-21 | Disposition: A | Discharge status: Home / Self Care | Payer: BLUE CROSS/BLUE SHIELD | Source: Ambulatory Visit | Attending: Vascular Surgery | Admitting: Vascular Surgery

## 2017-08-21 DIAGNOSIS — N186 End stage renal disease: Secondary | ICD-10-CM

## 2017-08-21 DIAGNOSIS — Z48812 Encounter for surgical aftercare following surgery on the circulatory system: Secondary | ICD-10-CM | POA: Diagnosis not present

## 2017-08-21 DIAGNOSIS — Z992 Dependence on renal dialysis: Secondary | ICD-10-CM | POA: Diagnosis present

## 2017-08-25 ENCOUNTER — Ambulatory Visit (INDEPENDENT_AMBULATORY_CARE_PROVIDER_SITE_OTHER): Payer: BLUE CROSS/BLUE SHIELD | Admitting: Physician Assistant

## 2017-08-25 ENCOUNTER — Other Ambulatory Visit: Payer: Self-pay

## 2017-08-25 DIAGNOSIS — Z992 Dependence on renal dialysis: Secondary | ICD-10-CM

## 2017-08-25 DIAGNOSIS — N186 End stage renal disease: Secondary | ICD-10-CM

## 2017-08-25 NOTE — Progress Notes (Signed)
    Postoperative Access Visit   History of Present Illness   [name] is a 42 y.o. year old male who presents for postoperative follow-up for: left brachiocephalic arteriovenous fistula by Dr. Trula Slade (Date: 07/23/17).  The patient's wounds are well healed.  The patient notes no steal symptoms.  The patient is  able to complete their activities of daily living.  He is dialyzing on a MWF schedule via [facility] without any problems.   Physical Examination   Vitals:   08/25/17 1320 08/25/17 1321  BP: (!) 147/82 (!) 145/79  Pulse: 81   Resp: 18   Temp: 98.6 F (37 C)   TempSrc: Oral   SpO2: 100%   Weight: 217 lb 12.8 oz (98.8 kg)   Height: 5\' 6"  (1.676 m)    Body mass index is 35.15 kg/m.  left arm Incision is  healed, hand grip is 5/5, sensation in digits is  intact, palpable thrill, bruit can  be auscultated; L radial is palpable     Medical Decision Making   [name] is a 42 y.o. year old male who presents s/p left brachiocephalic arteriovenous fistula   Diameter of cephalic vein 4.09-8.11BJ 5 weeks post operatively  Fistula is not yet ready for use  Continue HD via [facility]  Patient will follow up in about 1 month in [place] clinic to re-assess readiness of fistula for use during dialysis   Dagoberto Ligas, PA-C [facility] [place] Office: [phone]

## 2017-09-08 ENCOUNTER — Ambulatory Visit: Payer: BLUE CROSS/BLUE SHIELD | Admitting: Adult Health

## 2017-09-09 ENCOUNTER — Encounter: Payer: Self-pay | Admitting: Adult Health

## 2017-09-17 ENCOUNTER — Ambulatory Visit: Payer: BLUE CROSS/BLUE SHIELD | Admitting: Neurology

## 2017-10-13 ENCOUNTER — Ambulatory Visit (INDEPENDENT_AMBULATORY_CARE_PROVIDER_SITE_OTHER): Payer: Self-pay | Admitting: Physician Assistant

## 2017-10-13 ENCOUNTER — Other Ambulatory Visit: Payer: Self-pay

## 2017-10-13 VITALS — BP 103/72 | HR 89 | Temp 98.6°F | Resp 18 | Ht 66.0 in | Wt 215.0 lb

## 2017-10-13 DIAGNOSIS — N186 End stage renal disease: Secondary | ICD-10-CM

## 2017-10-13 DIAGNOSIS — Z992 Dependence on renal dialysis: Secondary | ICD-10-CM

## 2017-10-13 NOTE — Progress Notes (Signed)
    Postoperative Access Visit   History of Present Illness   [name] is a 42 y.o. year old male who presents for postoperative follow-up for: left brachiocephalic arteriovenous fistula by Dr. Trula Slade (Date: 07/23/17).  The patient's wounds are healed.  The patient denies steal symptoms.  The patient is  able to complete their activities of daily living.  He was seen in office about 1 month prior however fistula was not yet ready for use at that time.   Physical Examination   Vitals:   10/13/17 1324  BP: 103/72  Pulse: 89  Resp: 18  Temp: 98.6 F (37 C)  TempSrc: Oral  SpO2: 97%  Weight: 97.5 kg (215 lb)  Height: 5\' 6"  (1.676 m)   Body mass index is 34.7 kg/m.  left arm Incision is healed, hand grip is 5/5, sensation in digits is intact, palpable thrill, bruit can  be auscultated, palpable L radial pulse   Fistula ranging from 0.5 in mid upper arm to 0.6cm in distal upper arm and is less than 0.6cm from the surface by ultrasound    Medical Decision Making   [name] is a 42 y.o. year old male who presents s/p left brachiocephalic arteriovenous fistula   The patient's access is ready for use.  The patient's tunneled dalysis catheter can be removed when Nephrology is comfortable with performance of fistula during HD  I explained to the patient that since diameter and depth of fistula are borderline, he may require fistulogram if there is difficulty cannulating fistula   Dagoberto Ligas PA-C [facility] [place]: [phone]

## 2018-04-13 IMAGING — DX DG CHEST 1V PORT
1 series · 1 of 1 positions shown · non-contrast
Comparison: 07/06/2017

CLINICAL DATA: Followup catheter placement.

EXAM:
PORTABLE CHEST 1 VIEW

[chest]
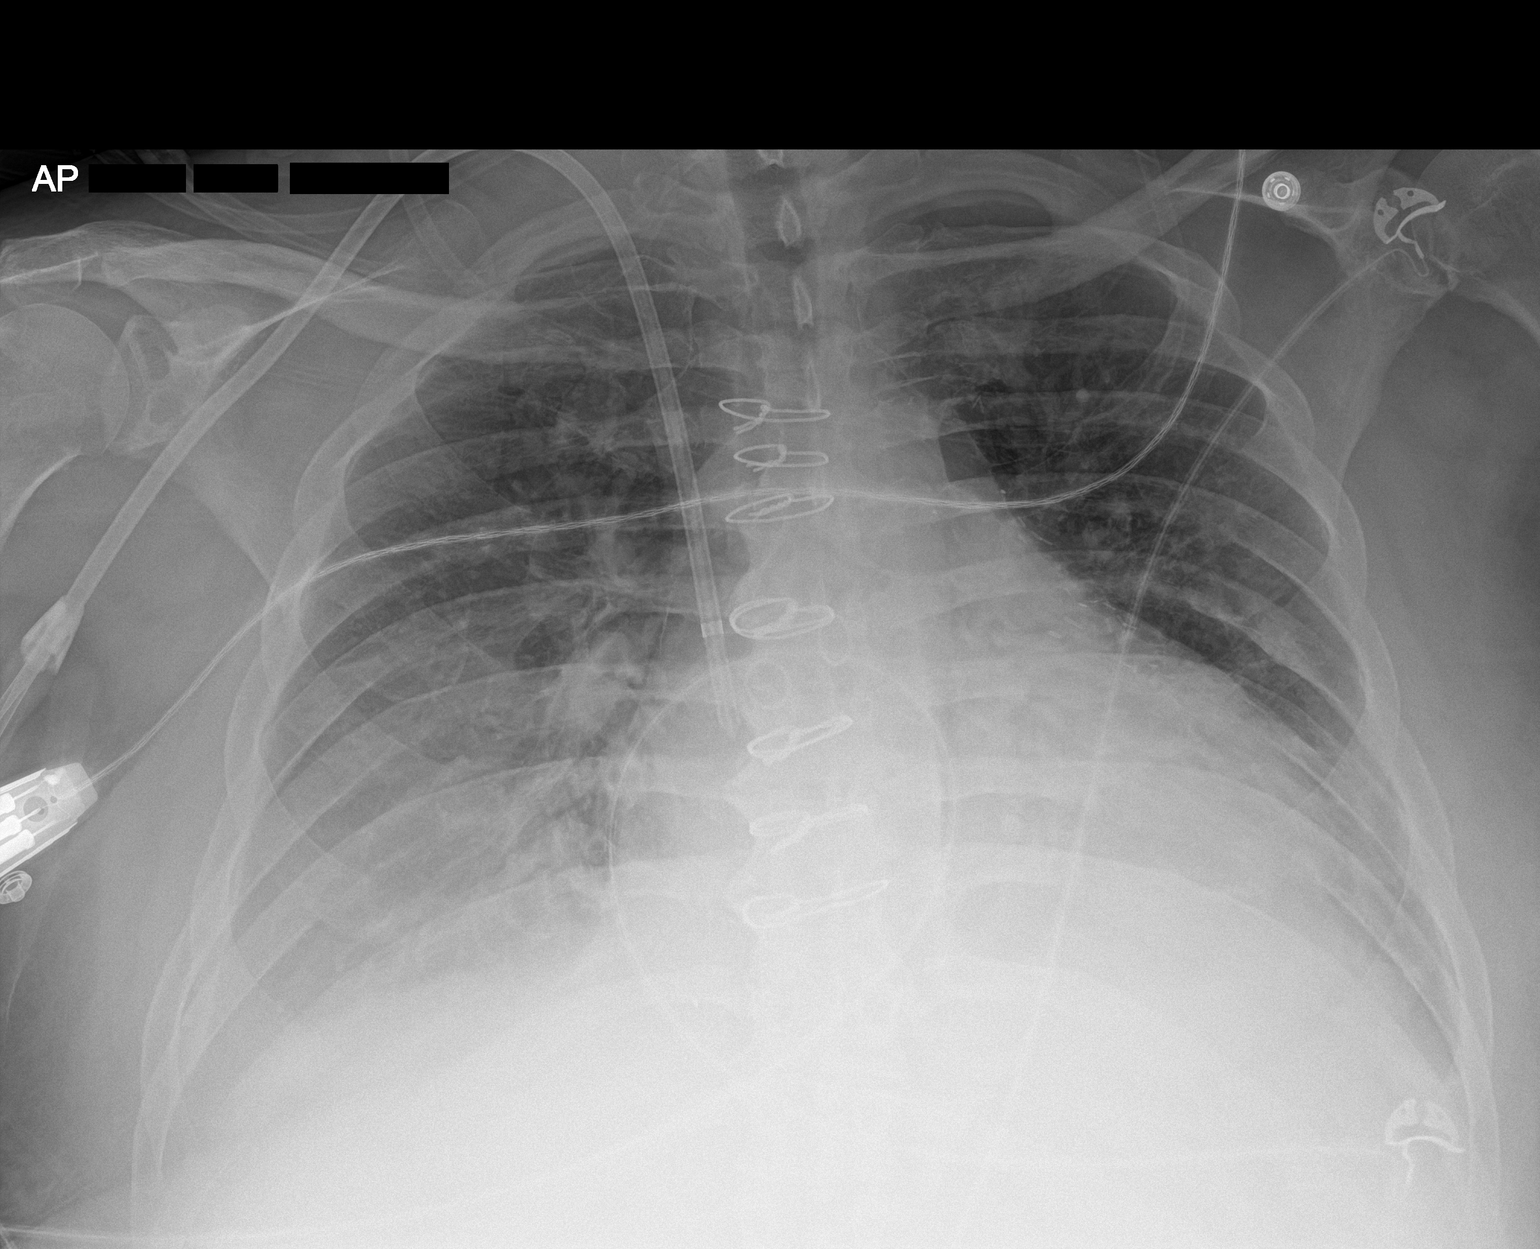

[1 of 1 positions shown; findings below may reference images not displayed]

FINDINGS: Right internal jugular catheter has been placed. The tip is at the
SVC RA junction. No pneumothorax. Venous hypertension and
interstitial edema persist with small effusions, right more than
left.
IMPRESSION: Catheter tip at the SVC RA junction.  No complication.

## 2018-11-13 ENCOUNTER — Other Ambulatory Visit: Payer: Self-pay | Admitting: Family Medicine

## 2018-11-13 DIAGNOSIS — Z20822 Contact with and (suspected) exposure to covid-19: Secondary | ICD-10-CM

## 2018-11-14 LAB — NOVEL CORONAVIRUS, NAA: SARS-CoV-2, NAA: NOT DETECTED

## 2018-12-21 ENCOUNTER — Encounter (HOSPITAL_COMMUNITY): Payer: Self-pay | Admitting: *Deleted

## 2018-12-21 NOTE — Progress Notes (Signed)
Received referral notification for participation in cardiac rehab from Dr. Barbaraann Boys at [facility] is S/p Des x 2 to distal RCA on 12/13/27.  Review medical history.  Notable ESRD with hemodialysis. Will need clearance from pt nephrologist for participation in CR and any BP parameters to observe.  Pt has completed his follow up appt on 12/21/18 with Loma Sousa Works PA with Dr. Ferdinand Lango.  Will have support staff send referral, request 12 lead ekg tracing, insurance benefits.  Will contact pt with insurance benefits, name of his nephrologist and days for dialysis.  Once requested information is received, may proceed with scheduling if appropriate.Cherre Huger, BSN Cardiac and Training and development officer

## 2018-12-30 ENCOUNTER — Telehealth (HOSPITAL_COMMUNITY): Payer: Self-pay

## 2018-12-30 NOTE — Telephone Encounter (Signed)
Called and spoke with pt in regards to CR, pt stated he does not wish to participate at this time. 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  Closed referral

## 2019-09-05 IMAGING — US US RENAL
1 series · 14 of 25 positions shown · non-contrast
Comparison: None.

CLINICAL DATA: 41-year-old male with KAKI. History of diabetes and
hypertension.

EXAM:
RENAL / URINARY TRACT ULTRASOUND COMPLETE

[Series 1: us renal · 0.28mm/px · 14 of 31 slices shown]
[im 1/31]
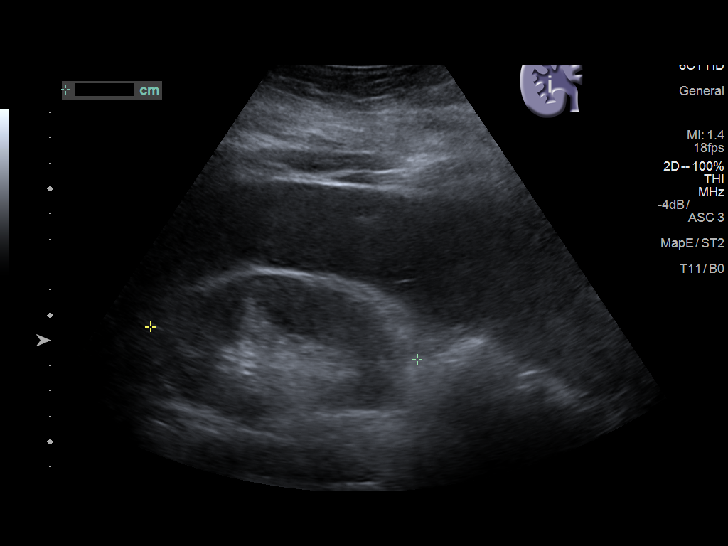
[im 3/31]
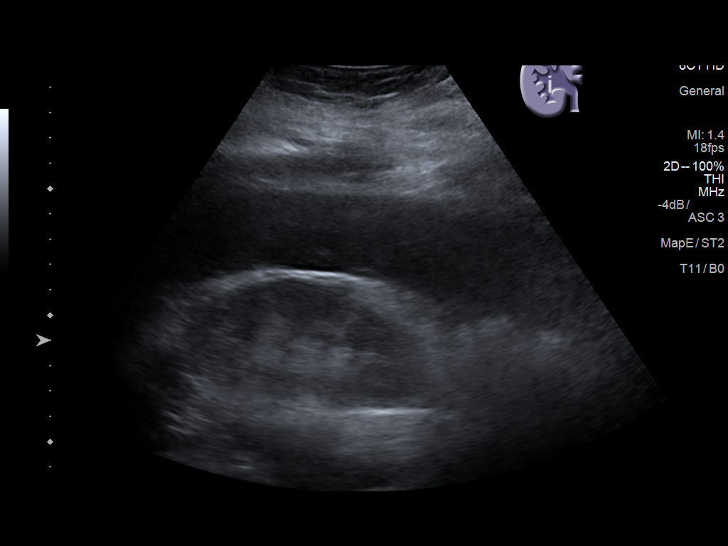
[im 6/31]
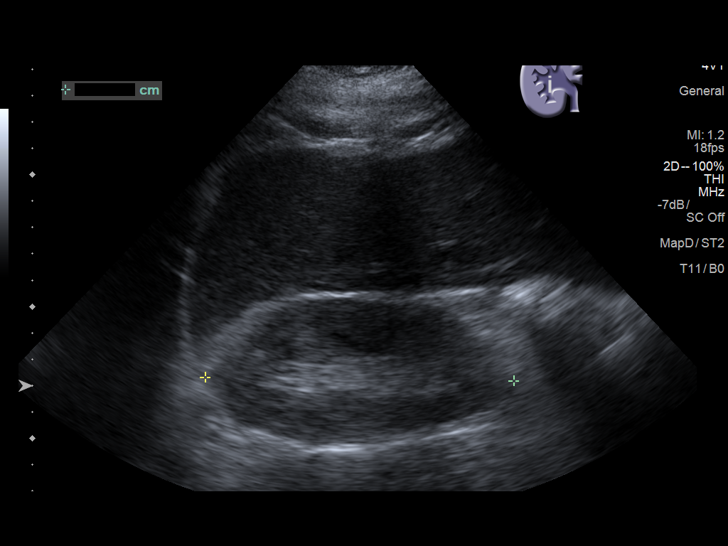
[im 8/31]
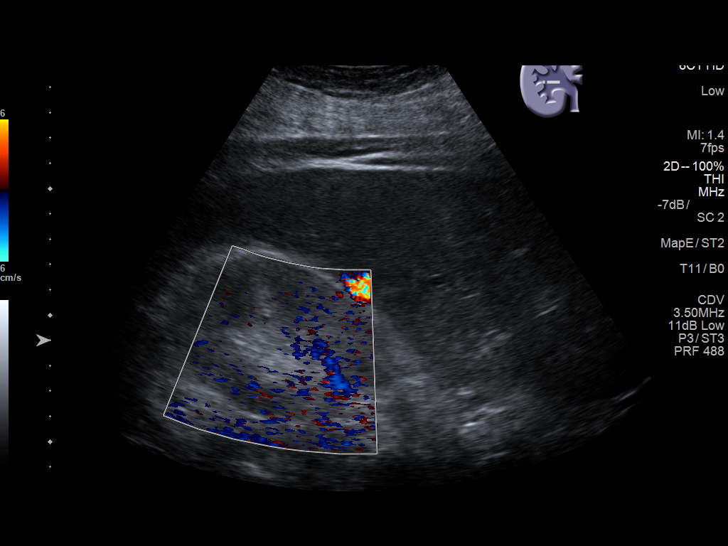
[im 11/31]
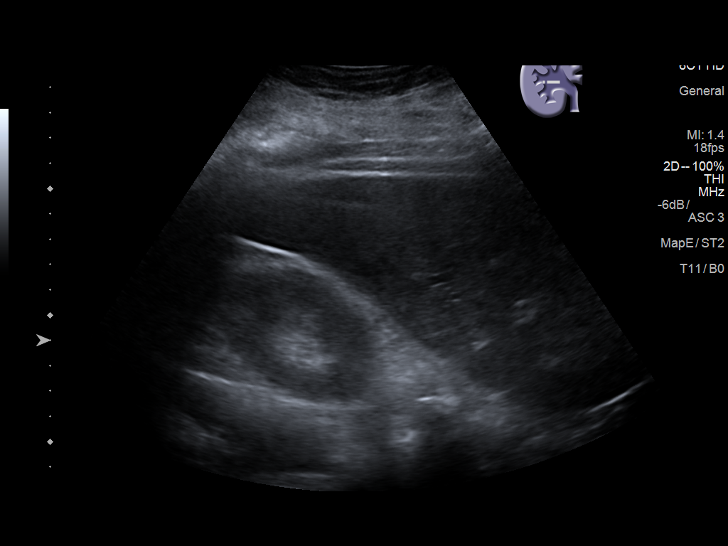
[im 12/31]
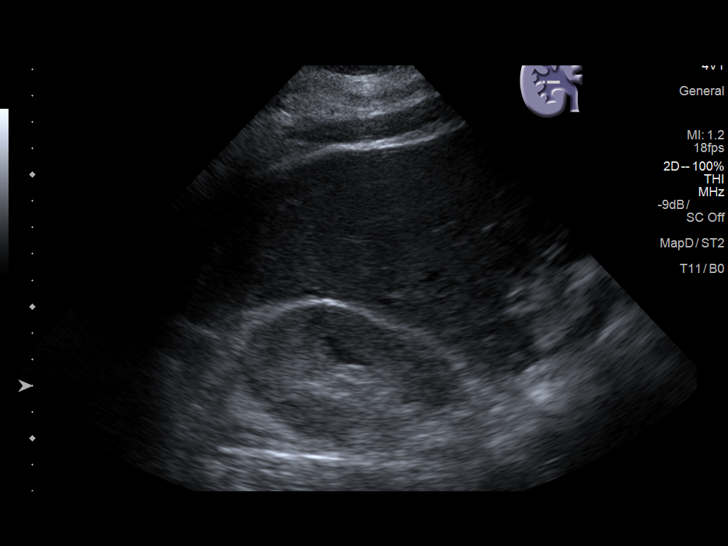
[im 14/31]
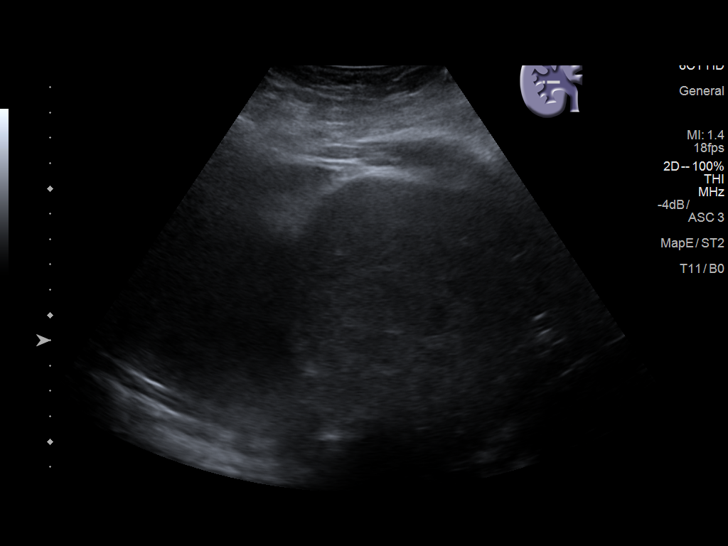
[im 17/31]
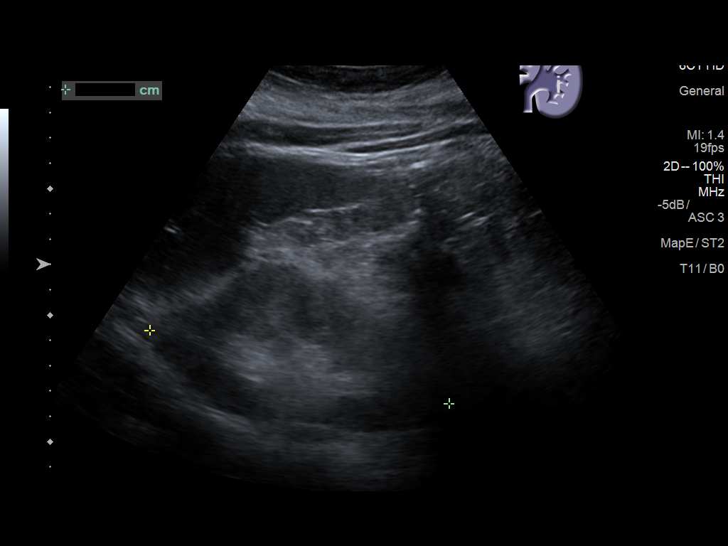
[im 19/31]
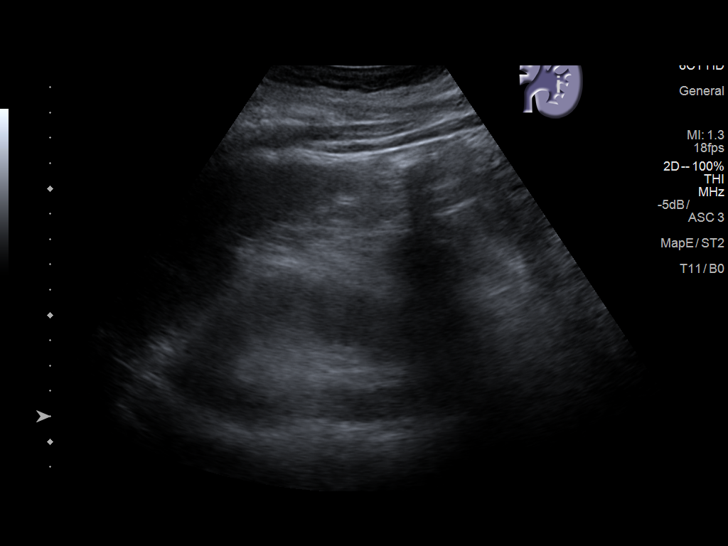
[im 21/31]
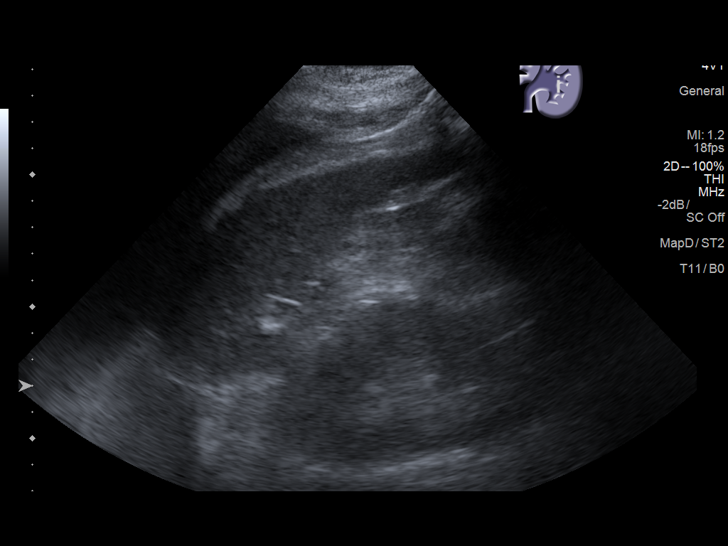
[im 23/31]
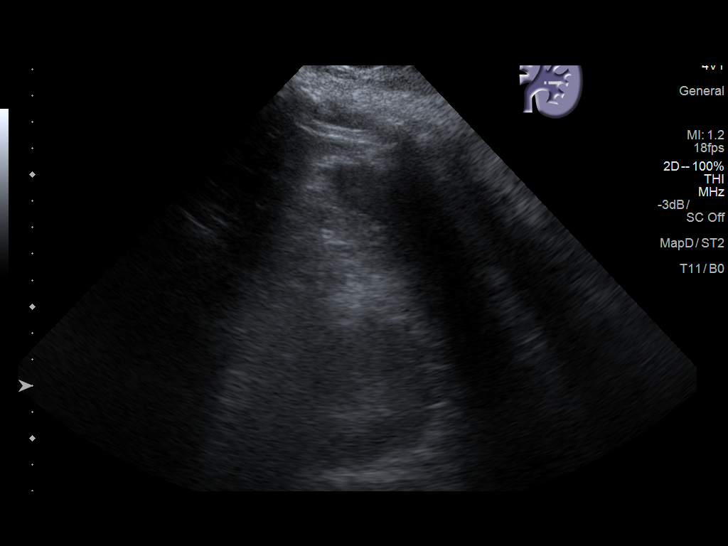
[im 26/31]
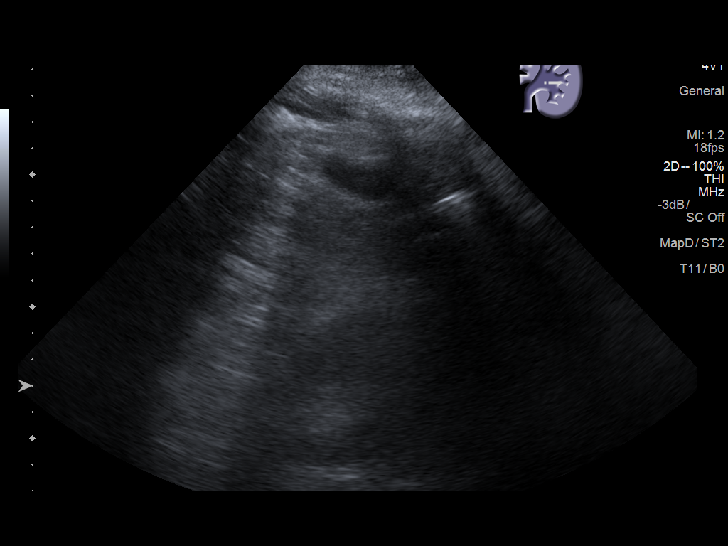
[im 28/31]
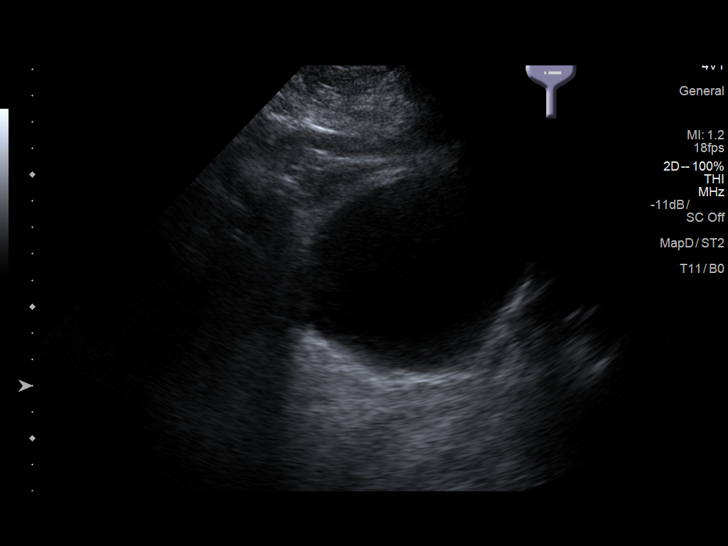
[im 31/31]
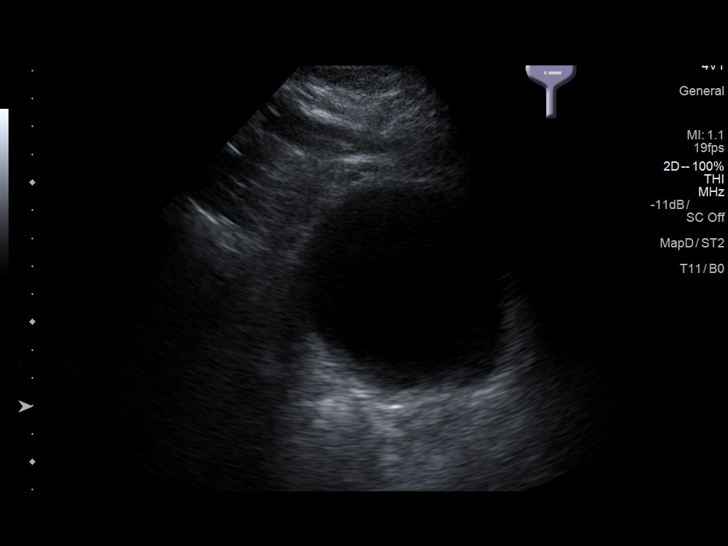

[14 of 25 positions shown; findings below may reference images not displayed]

FINDINGS: Right Kidney:

Length: 12 cm. Increased echogenicity. No hydronephrosis or
shadowing stone.

Left Kidney:

Length: 12 cm. Increased echogenicity. No hydronephrosis or
shadowing stone.

Bladder:

Appears normal for degree of bladder distention.
IMPRESSION: Increased renal echogenicity likely related to medical renal
disease. Clinical correlation is recommended. No hydronephrosis or
shadowing stone.

## 2020-06-21 ENCOUNTER — Telehealth (HOSPITAL_COMMUNITY): Payer: Self-pay

## 2020-06-21 NOTE — Telephone Encounter (Signed)
Pt insurance is active and benefits verified through Medicare A/B. Co-pay $0.00, DED $233.00/$233.00 met, out of pocket $0.00/$0.00 met, co-insurance 20%. No pre-authorization required. Passport, 06/21/20 @ 1:32PM, JEA#[id number]  2ndary insurance is active and benefits verified through [employer]. Co-pay $30.00, DED $0.00/$0.00 met, out of pocket $6,500.00/$600.61 met, co-insurance 30%. No pre-authorization required. Passport, 06/21/20 @ 1:34PM, JXF#[id number]  Will contact patient to see if he is interested in the Cardiac Rehab Program. Patient will be contacted for scheduling upon review by the RN Navigator.

## 2020-06-21 NOTE — Telephone Encounter (Signed)
Called patient to see if he is interested in the Cardiac Rehab Program. Patient expressed interest. Explained scheduling process and went over insurance, patient verbalized understanding.   Pt nephrologist is Dr. Moshe Cipro at [facility], he goes to dialysis on M,W, and F at 615AM for about 4 hours. He goes to [facility].

## 2020-06-27 ENCOUNTER — Encounter (HOSPITAL_COMMUNITY): Payer: Self-pay | Admitting: *Deleted

## 2020-06-27 NOTE — Progress Notes (Signed)
Received referral notification from Dr. Edwin Dada at [facility] for this pt to participate in Cardiac rehab with the diagnosis of Stable Angina.  Pt who participated in cardiac rehab in 2010. [name] declined participation in 2020. Clinical review of pt follow up appt on 2/21 with Dr. Edwin Dada - cardiologist office note. Also reviewed pt progress note from the Transplant team. Pt appropriate for scheduling for on site cardiac rehab and/or enrollment in Virtual Cardiac Rehab once we have received clearance from his nephrologist and signed MD referral form.  Pt Covid Risk Score is 7.  Will forward to staff for follow up. Cherre Huger, BSN Cardiac and Training and development officer

## 2020-09-12 ENCOUNTER — Telehealth (HOSPITAL_COMMUNITY): Payer: Self-pay

## 2020-09-12 NOTE — Telephone Encounter (Signed)
Pt called in regards to his CR referral adv pt of the following note, "Pt exercising with personal trainer 2 x week.  Did not receive requested documents from pt  Cardiologist at [place] - Dr. Edwin Dada. Cherre Huger, BSN Cardiac and Pulmonary Rehab Nurse Navigator".  Pt stated he will follow up with Dr. Edwin Dada office.

## 2020-09-13 ENCOUNTER — Telehealth (HOSPITAL_COMMUNITY): Payer: Self-pay | Admitting: Family Medicine

## 2020-09-13 NOTE — Telephone Encounter (Signed)
Recv'ed phone call from [place] at Dr. Edwin Dada who request that we fax our MD order for to fax number [phone]. Leretha Dykes of note on 6/1, she stated they never recv'ed the fax. Also adv of our backlog with CR, 1-3 months.  Recv'ed conformation that the fax went thru on 09/13/20 @ 9:20AM.

## 2020-09-19 ENCOUNTER — Telehealth (HOSPITAL_COMMUNITY): Payer: Self-pay

## 2020-09-19 NOTE — Telephone Encounter (Signed)
Pt insurance is active and benefits verified through [employer]. Co-pay $0.00, DED $233.00/$233.00 met, out of pocket $0.00/$0.00 met, co-insurance 20%. No pre-authorization required. Passport, 09/19/20 @ 9:53AM, EZB#[id number]  2ndary insurance is active and benefits verified through [employer]. Co-pay $30.00, DED $0.00/$0.00 met, out of pocket $6,500.00/$1,971.17 met, co-insurance 0%. No pre-authorization required. Passport, 09/19/20 @ 10:02AM, [id number]

## 2020-09-19 NOTE — Telephone Encounter (Signed)
Attempted to call patient in regards to Cardiac Rehab - LM on VM 

## 2020-09-19 NOTE — Telephone Encounter (Signed)
Recv'ed requested information from Dr. Edwin Dada office, now awaiting clearance letter from Dr. Moshe Cipro office.

## 2020-10-08 ENCOUNTER — Telehealth (HOSPITAL_COMMUNITY): Payer: Self-pay

## 2020-10-08 NOTE — Telephone Encounter (Signed)
Pt returned CR phone call and stated he is interested in our program. Pt will come for orientation on on 11/01/20 @ 1:15 and will attend the 1:15 class.   Mailed letter

## 2020-10-08 NOTE — Telephone Encounter (Signed)
Attempted to call patient in regards to Cardiac Rehab - LM on VM Mailed letter 

## 2020-10-26 ENCOUNTER — Telehealth (HOSPITAL_COMMUNITY): Payer: Self-pay | Admitting: *Deleted

## 2020-11-01 ENCOUNTER — Other Ambulatory Visit: Payer: Self-pay

## 2020-11-01 ENCOUNTER — Encounter (HOSPITAL_COMMUNITY): Payer: Self-pay

## 2020-11-01 ENCOUNTER — Encounter (HOSPITAL_COMMUNITY)
Admission: RE | Admit: 2020-11-01 | Discharge: 2020-11-01 | Disposition: A | Discharge status: Home / Self Care | Payer: Medicare Other | Source: Ambulatory Visit | Attending: Cardiology | Admitting: Cardiology

## 2020-11-01 VITALS — BP 148/76 | HR 94 | Ht 67.0 in | Wt 234.3 lb

## 2020-11-01 DIAGNOSIS — Z79899 Other long term (current) drug therapy: Secondary | ICD-10-CM | POA: Diagnosis not present

## 2020-11-01 DIAGNOSIS — Z7984 Long term (current) use of oral hypoglycemic drugs: Secondary | ICD-10-CM | POA: Diagnosis not present

## 2020-11-01 DIAGNOSIS — I208 Other forms of angina pectoris: Secondary | ICD-10-CM | POA: Diagnosis not present

## 2020-11-01 DIAGNOSIS — Z7982 Long term (current) use of aspirin: Secondary | ICD-10-CM | POA: Insufficient documentation

## 2020-11-01 LAB — GLUCOSE, CAPILLARY: Glucose-Capillary: 238 mg/dL — ABNORMAL HIGH (ref 70–99)

## 2020-11-01 NOTE — Progress Notes (Signed)
Cardiac Individual Treatment Plan  Patient Details  Name: [name] MRN: [id number] Date of Birth: Aug 18, 1975 Referring Provider:   Flowsheet Row CARDIAC REHAB PHASE II ORIENTATION from 11/01/2020 in [place]  Referring Provider Dr [name] MD at [facility] / Dr [name] MD, Covering       Initial Encounter Date:  [place] from 11/01/2020 in [place]  Date 11/01/20       Visit Diagnosis: Stable angina ([place])  Patient's Home Medications on Admission:  Current Outpatient Medications:    anastrozole (ARIMIDEX) 1 MG tablet, Take 1 mg by mouth every other day., Disp: , Rfl:    aspirin 81 MG EC tablet, Take 81 mg by mouth daily., Disp: , Rfl:    atorvastatin (LIPITOR) 40 MG tablet, Take 40 mg by mouth daily., Disp: , Rfl:    blood glucose meter kit and supplies KIT, Dispense based on patient and insurance preference. Use QD-BID home glucose monitoring. (FOR ICD-9 250.00, 250.01)., Disp: 1 each, Rfl: 0   BRILINTA 90 MG TABS tablet, Take 90 mg by mouth 2 (two) times daily., Disp: , Rfl:    carvedilol (COREG) 12.5 MG tablet, Take 12.5 mg by mouth 2 (two) times daily with a meal., Disp: , Rfl:    ezetimibe (ZETIA) 10 MG tablet, Take 10 mg by mouth daily., Disp: , Rfl:    gabapentin (NEURONTIN) 100 MG capsule, Take 100 mg by mouth 3 (three) times daily., Disp: , Rfl:    glimepiride (AMARYL) 1 MG tablet, Take 1 mg by mouth daily., Disp: , Rfl:    icosapent Ethyl (VASCEPA) 1 g capsule, Take 2 g by mouth 2 (two) times daily., Disp: , Rfl:    nitroGLYCERIN (NITROSTAT) 0.4 MG SL tablet, DISSOLVE ONE TABLET UNDER THE TONGUE EVERY 5 MINUTES AS NEEDED FOR CHEST PAIN.  DO NOT EXCEED A TOTAL OF 3 DOSES IN 15 MINUTES (Patient taking differently: Place 0.4 mg under the tongue every 5 (five) minutes as needed for chest pain.), Disp: 25 tablet, Rfl: 3   tadalafil (CIALIS) 5 MG tablet,  Take 5 mg by mouth daily as needed for erectile dysfunction., Disp: , Rfl:    TRULICITY 1.5 GE/9.5MW SOPN, Inject 1.5 mg into the skin once a week., Disp: , Rfl:    VELPHORO 500 MG chewable tablet, Chew 1,500 mg by mouth 3 (three) times daily with meals., Disp: , Rfl:    amLODipine (NORVASC) 10 MG tablet, Take 1 tablet (10 mg total) by mouth daily. (Patient not taking: No sig reported), Disp: 30 tablet, Rfl: 0   atorvastatin (LIPITOR) 20 MG tablet, TAKE ONE TABLET BY MOUTH ONCE DAILY AT BEDTIME (Patient not taking: Reported on 10/24/2020), Disp: 90 tablet, Rfl: 3   carvedilol (COREG) 25 MG tablet, TAKE 1 TABLET BY MOUTH TWICE DAILY (Patient not taking: Reported on 10/24/2020), Disp: 180 tablet, Rfl: 3   cloNIDine (CATAPRES) 0.2 MG tablet, Take 1 tablet (0.2 mg total) by mouth 3 (three) times daily. (Patient not taking: No sig reported), Disp: 90 tablet, Rfl: 0   glimepiride (AMARYL) 2 MG tablet, TAKE 2 MG BY MOUTH ONCE DAILY (Patient not taking: No sig reported), Disp: 30 tablet, Rfl: 0   hydrALAZINE (APRESOLINE) 50 MG tablet, Take 1 tablet (50 mg total) by mouth every 8 (eight) hours. (Patient not taking: No sig reported), Disp: 90 tablet, Rfl: 0   HYDROcodone-acetaminophen (NORCO) 5-325 MG tablet, Take 1  tablet by mouth every 6 (six) hours as needed for moderate pain. (Patient not taking: No sig reported), Disp: 8 tablet, Rfl: 0  Past Medical History: Past Medical History:  Diagnosis Date   CAD (coronary artery disease)    CHF (congestive heart failure) (HCC)    Chronic kidney disease    Diabetes mellitus    Heart murmur    " slight"   Hyperlipidemia    Hypertension    Sleep apnea    wears CPAP    Tobacco Use: Social History   Tobacco Use  Smoking Status Former   Types: Cigarettes   Quit date: 09/17/2008   Years since quitting: 12.1  Smokeless Tobacco Never    Labs: Recent Review Flowsheet Data     Labs for ITP Cardiac and Pulmonary Rehab Latest Ref Rng & Units 11/15/2014  03/25/2015 01/01/2016 08/18/2016 07/06/2017   Cholestrol 0 - 200 mg/dL 213(H) - - 195 104   LDLCALC 0 - 99 mg/dL NOT CALC - - 109(H) 55   LDLDIRECT mg/dL - - - - -   HDL >40 mg/dL 36(L) - - 38(L) 31(L)   Trlycerides <150 mg/dL 735(H) - - 240(H) 88   Hemoglobin A1c 4.8 - 5.6 % 10.5 13.4(H) 10.6 7.5 6.5(H)   PHART 7.350 - 7.450 - 7.348(L) - - -   PCO2ART 35.0 - 45.0 mmHg - 40.0 - - -   HCO3 20.0 - 24.0 mEq/L - 22.2 - - -   TCO2 0 - 100 mmol/L - 23 - - -   ACIDBASEDEF 0.0 - 2.0 mmol/L - 3.0(H) - - -   O2SAT % - 97.0 - - -       Capillary Blood Glucose: Lab Results  Component Value Date   GLUCAP 238 (H) 11/01/2020   GLUCAP 132 (H) 07/23/2017   GLUCAP 143 (H) 07/23/2017   GLUCAP 235 (H) 07/16/2017   GLUCAP 159 (H) 07/16/2017     Exercise Target Goals: Exercise Program Goal: Individual exercise prescription set using results from initial 6 min walk test and THRR while considering  patient's activity barriers and safety.   Exercise Prescription Goal: Starting with aerobic activity 30 plus minutes a day, 3 days per week for initial exercise prescription. Provide home exercise prescription and guidelines that participant acknowledges understanding prior to discharge.  Activity Barriers & Risk Stratification:  Activity Barriers & Cardiac Risk Stratification - 11/01/20 1446       Activity Barriers & Cardiac Risk Stratification   Activity Barriers Deconditioning;Chest Pain/Angina;Balance Concerns;Other (comment)    Comments Poor vision due to cataracts    Cardiac Risk Stratification High             6 Minute Walk:  6 Minute Walk     Row Name 11/01/20 1411         6 Minute Walk   Phase Initial     Distance 1372 feet     Walk Time 6 minutes     # of Rest Breaks 0     MPH 2.6     METS 4.25     RPE 11     Perceived Dyspnea  0     VO2 Peak 14.86     Symptoms No     Resting HR 96 bpm     Resting BP 148/76     Resting Oxygen Saturation  97 %     Exercise Oxygen  Saturation  during 6 min walk 96 %     Max Ex. HR  116 bpm     Max Ex. BP 158/90     2 Minute Post BP 152/86              Oxygen Initial Assessment:   Oxygen Re-Evaluation:   Oxygen Discharge (Final Oxygen Re-Evaluation):   Initial Exercise Prescription:  Initial Exercise Prescription - 11/01/20 1500       Date of Initial Exercise RX and Referring Provider   Date 11/01/20    Referring Provider Dr [name] MD at [facility] / Dr [name] MD, Covering    Expected Discharge Date 12/28/20      Recumbant Bike   Level 2    RPM 60    Minutes 15    METs 2.1      NuStep   Level 2    SPM 85    Minutes 15    METs 2.2      Prescription Details   Frequency (times per week) 3    Duration Progress to 30 minutes of continuous aerobic without signs/symptoms of physical distress      Intensity   THRR 40-80% of Max Heartrate 70-140    Ratings of Perceived Exertion 11-13    Perceived Dyspnea 0-4      Progression   Progression Continue progressive overload as per policy without signs/symptoms or physical distress.      Resistance Training   Training Prescription Yes    Weight 4    Reps 10-15             Perform Capillary Blood Glucose checks as needed.  Exercise Prescription Changes:   Exercise Comments:   Exercise Goals and Review:   Exercise Goals     Row Name 11/01/20 1447             Exercise Goals   Increase Physical Activity Yes       Intervention Provide advice, education, support and counseling about physical activity/exercise needs.;Develop an individualized exercise prescription for aerobic and resistive training based on initial evaluation findings, risk stratification, comorbidities and participant's personal goals.       Expected Outcomes Short Term: Attend rehab on a regular basis to increase amount of physical activity.;Long Term: Add in home exercise to make exercise part of routine and to increase amount of physical  activity.;Long Term: Exercising regularly at least 3-5 days a week.       Increase Strength and Stamina Yes       Intervention Provide advice, education, support and counseling about physical activity/exercise needs.;Develop an individualized exercise prescription for aerobic and resistive training based on initial evaluation findings, risk stratification, comorbidities and participant's personal goals.       Expected Outcomes Short Term: Increase workloads from initial exercise prescription for resistance, speed, and METs.;Short Term: Perform resistance training exercises routinely during rehab and add in resistance training at home;Long Term: Improve cardiorespiratory fitness, muscular endurance and strength as measured by increased METs and functional capacity (6MWT)       Able to understand and use rate of perceived exertion (RPE) scale Yes       Intervention Provide education and explanation on how to use RPE scale       Expected Outcomes Short Term: Able to use RPE daily in rehab to express subjective intensity level;Long Term:  Able to use RPE to guide intensity level when exercising independently       Knowledge and understanding of Target Heart Rate Range (THRR) Yes       Intervention  Provide education and explanation of THRR including how the numbers were predicted and where they are located for reference       Expected Outcomes Short Term: Able to state/look up THRR;Short Term: Able to use daily as guideline for intensity in rehab;Long Term: Able to use THRR to govern intensity when exercising independently       Understanding of Exercise Prescription Yes       Intervention Provide education, explanation, and written materials on patient's individual exercise prescription       Expected Outcomes Short Term: Able to explain program exercise prescription;Long Term: Able to explain home exercise prescription to exercise independently                Exercise Goals Re-Evaluation  :    Discharge Exercise Prescription (Final Exercise Prescription Changes):   Nutrition:  Target Goals: Understanding of nutrition guidelines, daily intake of sodium '1500mg'$ , cholesterol '200mg'$ , calories 30% from fat and 7% or less from saturated fats, daily to have 5 or more servings of fruits and vegetables.  Biometrics:  Pre Biometrics - 11/01/20 1315       Pre Biometrics   Waist Circumference 47 inches    Hip Circumference 44 inches    Waist to Hip Ratio 1.07 %    Triceps Skinfold 24 mm    % Body Fat 35.6 %    Grip Strength 35 kg    Flexibility 0 in   cannot reach   Single Leg Stand 5.26 seconds              Nutrition Therapy Plan and Nutrition Goals:   Nutrition Assessments:  MEDIFICTS Score Key: ?70 Need to make dietary changes  40-70 Heart Healthy Diet ? 40 Therapeutic Level Cholesterol Diet   Picture Your Plate Scores: <49 Unhealthy dietary pattern with much room for improvement. 41-50 Dietary pattern unlikely to meet recommendations for good health and room for improvement. 51-60 More healthful dietary pattern, with some room for improvement.  >60 Healthy dietary pattern, although there may be some specific behaviors that could be improved.    Nutrition Goals Re-Evaluation:   Nutrition Goals Discharge (Final Nutrition Goals Re-Evaluation):   Psychosocial: Target Goals: Acknowledge presence or absence of significant depression and/or stress, maximize coping skills, provide positive support system. Participant is able to verbalize types and ability to use techniques and skills needed for reducing stress and depression.  Initial Review & Psychosocial Screening:  Initial Psych Review & Screening - 11/01/20 1459       Initial Review   Current issues with Current Stress Concerns    Source of Stress Concerns Chronic Illness;Retirement/disability    Comments [name] is on HD and is on disability due to ESRD.      Family Dynamics   Good Support  System? Yes      Barriers   Psychosocial barriers to participate in program The patient should benefit from training in stress management and relaxation.      Screening Interventions   Interventions Encouraged to exercise;Provide feedback about the scores to participant;To provide support and resources with identified psychosocial needs    Expected Outcomes Long Term Goal: Stressors or current issues are controlled or eliminated.;Short Term goal: Identification and review with participant of any Quality of Life or Depression concerns found by scoring the questionnaire.             Quality of Life Scores:  Quality of Life - 11/01/20 1416       Quality of Life  Select Quality of Life      Quality of Life Scores   Health/Function Pre 19.6 %    Socioeconomic Pre 16.5 %    Psych/Spiritual Pre 18.86 %    Family Pre 22.5 %    GLOBAL Pre 19.14 %            Scores of 19 and below usually indicate a poorer quality of life in these areas.  A difference of  2-3 points is a clinically meaningful difference.  A difference of 2-3 points in the total score of the Quality of Life Index has been associated with significant improvement in overall quality of life, self-image, physical symptoms, and general health in studies assessing change in quality of life.  PHQ-9: Recent Review Flowsheet Data     Depression screen [facility] 2/9 11/01/2020 08/29/2016 08/18/2016 02/14/2016 01/01/2016   Decreased Interest 0 0 0 0 0   Down, Depressed, Hopeless 0 0 0 0 0   PHQ - 2 Score 0 0 0 0 0      Interpretation of Total Score  Total Score Depression Severity:  1-4 = Minimal depression, 5-9 = Mild depression, 10-14 = Moderate depression, 15-19 = Moderately severe depression, 20-27 = Severe depression   Psychosocial Evaluation and Intervention:   Psychosocial Re-Evaluation:   Psychosocial Discharge (Final Psychosocial Re-Evaluation):   Vocational Rehabilitation: Provide vocational rehab assistance to  qualifying candidates.   Vocational Rehab Evaluation & Intervention:  Vocational Rehab - 11/01/20 1520       Initial Vocational Rehab Evaluation & Intervention   Assessment shows need for Vocational Rehabilitation No   Jaynie Collins is on disability and does not need vocational rehab at this time            Education: Education Goals: Education classes will be provided on a weekly basis, covering required topics. Participant will state understanding/return demonstration of topics presented.  Learning Barriers/Preferences:  Learning Barriers/Preferences - 11/01/20 1456       Learning Barriers/Preferences   Learning Barriers Sight   wears glasses, has cataracts   Learning Preferences Computer/Internet;Group Instruction;Individual Instruction;Pictoral;Skilled Demonstration;Video             Education Topics: Hypertension, Hypertension Reduction -Define heart disease and high blood pressure. Discus how high blood pressure affects the body and ways to reduce high blood pressure.   Exercise and Your Heart -Discuss why it is important to exercise, the FITT principles of exercise, normal and abnormal responses to exercise, and how to exercise safely.   Angina -Discuss definition of angina, causes of angina, treatment of angina, and how to decrease risk of having angina.   Cardiac Medications -Review what the following cardiac medications are used for, how they affect the body, and side effects that may occur when taking the medications.  Medications include Aspirin, Beta blockers, calcium channel blockers, ACE Inhibitors, angiotensin receptor blockers, diuretics, digoxin, and antihyperlipidemics.   Congestive Heart Failure -Discuss the definition of CHF, how to live with CHF, the signs and symptoms of CHF, and how keep track of weight and sodium intake.   Heart Disease and Intimacy -Discus the effect sexual activity has on the heart, how changes occur during intimacy as we age,  and safety during sexual activity.   Smoking Cessation / COPD -Discuss different methods to quit smoking, the health benefits of quitting smoking, and the definition of COPD.   Nutrition I: Fats -Discuss the types of cholesterol, what cholesterol does to the heart, and how cholesterol levels can be controlled.  Nutrition II: Labels -Discuss the different components of food labels and how to read food label   Heart Parts/Heart Disease and PAD -Discuss the anatomy of the heart, the pathway of blood circulation through the heart, and these are affected by heart disease.   Stress I: Signs and Symptoms -Discuss the causes of stress, how stress may lead to anxiety and depression, and ways to limit stress.   Stress II: Relaxation -Discuss different types of relaxation techniques to limit stress.   Warning Signs of Stroke / TIA -Discuss definition of a stroke, what the signs and symptoms are of a stroke, and how to identify when someone is having stroke.   Knowledge Questionnaire Score:  Knowledge Questionnaire Score - 11/01/20 1418       Knowledge Questionnaire Score   Pre Score 20/24             Core Components/Risk Factors/Patient Goals at Admission:  Personal Goals and Risk Factors at Admission - 11/01/20 1459       Core Components/Risk Factors/Patient Goals on Admission    Weight Management Yes;Obesity;Weight Loss    Intervention Weight Management: Develop a combined nutrition and exercise program designed to reach desired caloric intake, while maintaining appropriate intake of nutrient and fiber, sodium and fats, and appropriate energy expenditure required for the weight goal.;Weight Management: Provide education and appropriate resources to help participant work on and attain dietary goals.;Weight Management/Obesity: Establish reasonable short term and long term weight goals.;Obesity: Provide education and appropriate resources to help participant work on and attain  dietary goals.    Admit Weight 234 lb 5.6 oz (106.3 kg)    Expected Outcomes Short Term: Continue to assess and modify interventions until short term weight is achieved;Long Term: Adherence to nutrition and physical activity/exercise program aimed toward attainment of established weight goal;Weight Maintenance: Understanding of the daily nutrition guidelines, which includes 25-35% calories from fat, 7% or less cal from saturated fats, less than $RemoveB'200mg'hFZtCnrg$  cholesterol, less than 1.5gm of sodium, & 5 or more servings of fruits and vegetables daily;Weight Loss: Understanding of general recommendations for a balanced deficit meal plan, which promotes 1-2 lb weight loss per week and includes a negative energy balance of [phone] kcal/d;Understanding recommendations for meals to include 15-35% energy as protein, 25-35% energy from fat, 35-60% energy from carbohydrates, less than $RemoveB'200mg'AqbfHcdg$  of dietary cholesterol, 20-35 gm of total fiber daily;Understanding of distribution of calorie intake throughout the day with the consumption of 4-5 meals/snacks    Diabetes Yes    Intervention Provide education about signs/symptoms and action to take for hypo/hyperglycemia.;Provide education about proper nutrition, including hydration, and aerobic/resistive exercise prescription along with prescribed medications to achieve blood glucose in normal ranges: Fasting glucose 65-99 mg/dL    Expected Outcomes Short Term: Participant verbalizes understanding of the signs/symptoms and immediate care of hyper/hypoglycemia, proper foot care and importance of medication, aerobic/resistive exercise and nutrition plan for blood glucose control.;Long Term: Attainment of HbA1C < 7%.    Hypertension Yes    Intervention Provide education on lifestyle modifcations including regular physical activity/exercise, weight management, moderate sodium restriction and increased consumption of fresh fruit, vegetables, and low fat dairy, alcohol moderation, and  smoking cessation.;Monitor prescription use compliance.    Expected Outcomes Short Term: Continued assessment and intervention until BP is < 140/52mm HG in hypertensive participants. < 130/73mm HG in hypertensive participants with diabetes, heart failure or chronic kidney disease.;Long Term: Maintenance of blood pressure at goal levels.    Lipids Yes    Intervention  Provide education and support for participant on nutrition & aerobic/resistive exercise along with prescribed medications to achieve LDL '70mg'$ , HDL >$Remo'40mg'rlJTL$ .    Expected Outcomes Short Term: Participant states understanding of desired cholesterol values and is compliant with medications prescribed. Participant is following exercise prescription and nutrition guidelines.;Long Term: Cholesterol controlled with medications as prescribed, with individualized exercise RX and with personalized nutrition plan. Value goals: LDL < $Rem'70mg'gfOW$ , HDL > 40 mg.    Stress Yes    Intervention Offer individual and/or small group education and counseling on adjustment to heart disease, stress management and health-related lifestyle change. Teach and support self-help strategies.;Refer participants experiencing significant psychosocial distress to appropriate mental health specialists for further evaluation and treatment. When possible, include family members and significant others in education/counseling sessions.    Expected Outcomes Short Term: Participant demonstrates changes in health-related behavior, relaxation and other stress management skills, ability to obtain effective social support, and compliance with psychotropic medications if prescribed.;Long Term: Emotional wellbeing is indicated by absence of clinically significant psychosocial distress or social isolation.             Core Components/Risk Factors/Patient Goals Review:    Core Components/Risk Factors/Patient Goals at Discharge (Final Review):    ITP Comments:  ITP Comments     Row Name  11/01/20 1458           ITP Comments Dr [name] MD, Medical Director                Comments: [name] attended orientation on 11/01/2020 to review rules and guidelines for program.  Completed 6 minute walk test, Intitial ITP, and exercise prescription. Systolic BP 502-774/12-87 diastolic BP. Telemetry-Sinus Rhythm.  Asymptomatic. Safety measures and social distancing in place per CDC guidelines. Will continue to monitor BP's.Barnet Pall, RN,BSN 11/01/2020 3:26 PM

## 2020-11-01 NOTE — Progress Notes (Signed)
Cardiac Rehab Medication Review by a Nurse  Does the patient  feel that his/her medications are working for him/her?  YES   Has the patient been experiencing any side effects to the medications prescribed?   NO  Does the patient measure his/her own blood pressure or blood glucose at home?   NO  Does the patient have any problems obtaining medications due to transportation or finances?    NO  Understanding of regimen: good Understanding of indications: good Potential of compliance: good    Nurse comments: [name] is taking his medications as prescribed and says he has a good understanding of what his medications are for. [name] says he had a CGM and took it off last week [name] has a BP monitor but does not check his blood pressures at home on a regular basis.    Christa See [facility] RN 11/01/2020 4:01 PM

## 2020-11-05 ENCOUNTER — Encounter (HOSPITAL_COMMUNITY)
Admission: RE | Admit: 2020-11-05 | Discharge: 2020-11-05 | Disposition: A | Discharge status: Home / Self Care | Payer: Medicare Other | Source: Ambulatory Visit | Attending: Cardiology | Admitting: Cardiology

## 2020-11-05 ENCOUNTER — Other Ambulatory Visit: Payer: Self-pay

## 2020-11-05 DIAGNOSIS — I208 Other forms of angina pectoris: Secondary | ICD-10-CM | POA: Diagnosis not present

## 2020-11-05 LAB — GLUCOSE, CAPILLARY
Glucose-Capillary: 164 mg/dL — ABNORMAL HIGH (ref 70–99)
Glucose-Capillary: 146 mg/dL — ABNORMAL HIGH (ref 70–99)

## 2020-11-05 NOTE — Progress Notes (Signed)
Daily Session Note  Patient Details  Name: [name] MRN: [id number] Date of Birth: [birth year] Referring Provider:   Flowsheet Row CARDIAC REHAB PHASE II ORIENTATION from 11/01/2020 in [place]  Referring Provider Dr [name] MD at [facility] / Dr [name] MD, Covering       Encounter Date: 11/05/2020  Check In:  Session Check In - 11/05/20 1320       Check-In   Supervising physician immediately available to respond to emergencies [facility] immediately available    Physician(s) Dr. Sabino Gasser    Location [facility]    Staff Present Barnet Pall, RN, BSN;Jetta Walker BS, ACSM EP-C, Exercise Physiologist;David Terramuggus, MS, ACSM-CEP, CCRP, Exercise Physiologist;Olinty Celesta Aver, MS, ACSM CEP, Exercise Physiologist;Carlette Wilber Oliphant, RN, BSN    Virtual Visit No    Medication changes reported     No    Fall or balance concerns reported    No    Tobacco Cessation No Change    Current number of cigarettes/nicotine per day     0    Warm-up and Cool-down Performed on first and last piece of equipment    Resistance Training Performed Yes    VAD Patient? No    PAD/SET Patient? No      Pain Assessment   Currently in Pain? No/denies    Pain Score 0-No pain    Multiple Pain Sites No             Capillary Blood Glucose: Results for orders placed or performed during the hospital encounter of 11/05/20 (from the past 24 hour(s))  Glucose, capillary     Status: Abnormal   Collection Time: 11/05/20  1:04 PM  Result Value Ref Range   Glucose-Capillary 164 (H) 70 - 99 mg/dL  Glucose, capillary     Status: Abnormal   Collection Time: 11/05/20  2:22 PM  Result Value Ref Range   Glucose-Capillary 146 (H) 70 - 99 mg/dL     Exercise Prescription Changes - 11/05/20 1600       Response to Exercise   Blood Pressure (Admit) 144/80    Blood Pressure (Exercise) 140/78    Blood Pressure (Exit) 134/70    Heart Rate  (Admit) 97 bpm    Heart Rate (Exercise) 107 bpm    Heart Rate (Exit) 95 bpm    Rating of Perceived Exertion (Exercise) 12    Symptoms None    Comments Pt's first day in the CRP2 program    Duration Continue with 30 min of aerobic exercise without signs/symptoms of physical distress.    Intensity THRR unchanged      Progression   Progression Continue to progress workloads to maintain intensity without signs/symptoms of physical distress.    Average METs 2      Resistance Training   Training Prescription Yes    Weight 4    Reps 10-15    Time 10 Minutes      Interval Training   Interval Training No      Recumbant Bike   Level 2    RPM 60    Minutes 15    METs 2      NuStep   Level 2    SPM 80    Minutes 15    METs 2             Social History   Tobacco Use  Smoking Status Former   Types: Cigarettes   Quit date:  09/17/2008   Years since quitting: 12.1  Smokeless Tobacco Never    Goals Met:  Exercise tolerated well No report of cardiac concerns or symptoms Strength training completed today  Goals Unmet:  Not Applicable  Comments: [name] started cardiac rehab today.  Pt tolerated light exercise without difficulty. Systolic BP ranged from 962-836- diastolic BP 62-94, telemetry-Sinus Rhythm, asymptomatic.  Medication list reconciled. Pt denies barriers to medicaiton compliance.  PSYCHOSOCIAL ASSESSMENT:  PHQ-0. Pt exhibits positive coping skills, hopeful outlook with supportive family. No psychosocial needs identified at this time, no psychosocial interventions necessary.    Pt enjoys church and playing video games.   Pt oriented to exercise equipment and routine.    Understanding verbalized. Barnet Pall, RN,BSN 11/06/2020 7:41 AM    Dr. [name] is Medical Director for Cardiac Rehab at [facility].

## 2020-11-07 ENCOUNTER — Encounter (HOSPITAL_COMMUNITY)
Admission: RE | Admit: 2020-11-07 | Discharge: 2020-11-07 | Disposition: A | Discharge status: Home / Self Care | Payer: Medicare Other | Source: Ambulatory Visit | Attending: Cardiology | Admitting: Cardiology

## 2020-11-07 ENCOUNTER — Other Ambulatory Visit: Payer: Self-pay

## 2020-11-07 VITALS — Wt 229.5 lb

## 2020-11-07 DIAGNOSIS — I208 Other forms of angina pectoris: Secondary | ICD-10-CM | POA: Diagnosis not present

## 2020-11-07 DIAGNOSIS — I2089 Other forms of angina pectoris: Secondary | ICD-10-CM

## 2020-11-07 LAB — GLUCOSE, CAPILLARY: Glucose-Capillary: 164 mg/dL — ABNORMAL HIGH (ref 70–99)

## 2020-11-07 NOTE — Progress Notes (Signed)
[name] 45 y.o. male Nutrition Note  Diagnosis: Stable Angina  Past Medical History:  Diagnosis Date   CAD (coronary artery disease)    CHF (congestive heart failure) (HCC)    Chronic kidney disease    Diabetes mellitus    Heart murmur    " slight"   Hyperlipidemia    Hypertension    Sleep apnea    wears CPAP     Medications reviewed.   Current Outpatient Medications:    amLODipine (NORVASC) 10 MG tablet, Take 1 tablet (10 mg total) by mouth daily. (Patient not taking: No sig reported), Disp: 30 tablet, Rfl: 0   anastrozole (ARIMIDEX) 1 MG tablet, Take 1 mg by mouth every other day., Disp: , Rfl:    aspirin 81 MG EC tablet, Take 81 mg by mouth daily., Disp: , Rfl:    atorvastatin (LIPITOR) 20 MG tablet, TAKE ONE TABLET BY MOUTH ONCE DAILY AT BEDTIME (Patient not taking: Reported on 10/24/2020), Disp: 90 tablet, Rfl: 3   atorvastatin (LIPITOR) 40 MG tablet, Take 40 mg by mouth daily., Disp: , Rfl:    blood glucose meter kit and supplies KIT, Dispense based on patient and insurance preference. Use QD-BID home glucose monitoring. (FOR ICD-9 250.00, 250.01)., Disp: 1 each, Rfl: 0   BRILINTA 90 MG TABS tablet, Take 90 mg by mouth 2 (two) times daily., Disp: , Rfl:    carvedilol (COREG) 12.5 MG tablet, Take 12.5 mg by mouth 2 (two) times daily with a meal., Disp: , Rfl:    carvedilol (COREG) 25 MG tablet, TAKE 1 TABLET BY MOUTH TWICE DAILY (Patient not taking: Reported on 10/24/2020), Disp: 180 tablet, Rfl: 3   cloNIDine (CATAPRES) 0.2 MG tablet, Take 1 tablet (0.2 mg total) by mouth 3 (three) times daily. (Patient not taking: No sig reported), Disp: 90 tablet, Rfl: 0   ezetimibe (ZETIA) 10 MG tablet, Take 10 mg by mouth daily., Disp: , Rfl:    gabapentin (NEURONTIN) 100 MG capsule, Take 100 mg by mouth 3 (three) times daily., Disp: , Rfl:    glimepiride (AMARYL) 1 MG tablet, Take 1 mg by mouth daily., Disp: , Rfl:    glimepiride (AMARYL) 2 MG tablet, TAKE 2 MG BY MOUTH ONCE DAILY  (Patient not taking: No sig reported), Disp: 30 tablet, Rfl: 0   hydrALAZINE (APRESOLINE) 50 MG tablet, Take 1 tablet (50 mg total) by mouth every 8 (eight) hours. (Patient not taking: No sig reported), Disp: 90 tablet, Rfl: 0   HYDROcodone-acetaminophen (NORCO) 5-325 MG tablet, Take 1 tablet by mouth every 6 (six) hours as needed for moderate pain. (Patient not taking: No sig reported), Disp: 8 tablet, Rfl: 0   icosapent Ethyl (VASCEPA) 1 g capsule, Take 2 g by mouth 2 (two) times daily., Disp: , Rfl:    nitroGLYCERIN (NITROSTAT) 0.4 MG SL tablet, DISSOLVE ONE TABLET UNDER THE TONGUE EVERY 5 MINUTES AS NEEDED FOR CHEST PAIN.  DO NOT EXCEED A TOTAL OF 3 DOSES IN 15 MINUTES (Patient taking differently: Place 0.4 mg under the tongue every 5 (five) minutes as needed for chest pain.), Disp: 25 tablet, Rfl: 3   tadalafil (CIALIS) 5 MG tablet, Take 5 mg by mouth daily as needed for erectile dysfunction., Disp: , Rfl:    TRULICITY 1.5 MO/7.0BE SOPN, Inject 1.5 mg into the skin once a week., Disp: , Rfl:    VELPHORO 500 MG chewable tablet, Chew 1,500 mg by mouth 3 (three) times daily with meals., Disp: , Rfl:  Ht Readings from Last 1 Encounters:  11/01/20 $RemoveB'5\' 7"'[id number]$  (1.702 m)     Wt Readings from Last 3 Encounters:  11/01/20 234 lb 5.6 oz (106.3 kg)  10/13/17 215 lb (97.5 kg)  08/25/17 217 lb 12.8 oz (98.8 kg)     There is no height or weight on file to calculate BMI.   Social History   Tobacco Use  Smoking Status Former   Types: Cigarettes   Quit date: 09/17/2008   Years since quitting: 12.1  Smokeless Tobacco Never     Lab Results  Component Value Date   CHOL 104 07/06/2017   Lab Results  Component Value Date   HDL 31 (L) 07/06/2017   Lab Results  Component Value Date   LDLCALC 55 07/06/2017   Lab Results  Component Value Date   TRIG 88 07/06/2017     Lab Results  Component Value Date   HGBA1C 6.5 (H) 07/06/2017     CBG (last 3)  Recent Labs    11/05/20 1304  11/05/20 1422  GLUCAP 164* 146*     Nutrition Note  Spoke with pt. Nutrition Plan and Nutrition Survey goals reviewed with pt. Per PYP results, pt is following a Heart Healthy diet with room for improvement.  Pt has Type 2 Diabetes. Last A1c indicates blood glucose well-controlled. Pt checks CBG's 1 times a day. Fasting CBG's reportedly 157-238 mg/dL. He plans to get freestyle libre again. He has had diabetes since he was 45 years old. He has had DSME years ago. He is somewhat familiar with carb counting.   Pt with dx of CHF. Per discussion, pt does not use canned/convenience foods often. Pt does not add salt to food. Pt does eat out frequently (~4 times per week). Pt is unsure if he is on a fluid restriction.  He is on hemodialysis. He reports focus on reducing phos in his diet. He takes phosphate binders with meals as prescribed.   Pt expressed understanding of the information reviewed.    Nutrition Diagnosis Food-and nutrition-related knowledge deficit related to lack of exposure to information as related to diagnosis of: ? CVD ? Type 2 Diabetes  Nutrition Intervention Pt's individual nutrition plan reviewed with pt. Benefits of adopting Heart Healthy diet discussed when Picture Your Plate reviewed.   Pt given handouts for: ? NCM heart failure nutrition therapy  Continue client-centered nutrition education by RD, as part of interdisciplinary care.  Goal(s) Pt to read labels and reduce sodium intake to <2000 mg/day  Pt to eat out only 2 times per week  Pt to build a healthy plate including vegetables, fruits, whole grains, and low-fat dairy products in a heart healthy meal plan.   Plan:  Will provide client-centered nutrition education as part of interdisciplinary care Monitor and evaluate progress toward nutrition goal with team.   Michaele Offer, MS, RDN, LDN

## 2020-11-09 ENCOUNTER — Encounter (HOSPITAL_COMMUNITY)
Admission: RE | Admit: 2020-11-09 | Discharge: 2020-11-09 | Disposition: A | Discharge status: Home / Self Care | Payer: Medicare Other | Source: Ambulatory Visit | Attending: Cardiology | Admitting: Cardiology

## 2020-11-09 ENCOUNTER — Other Ambulatory Visit: Payer: Self-pay

## 2020-11-09 DIAGNOSIS — I208 Other forms of angina pectoris: Secondary | ICD-10-CM

## 2020-11-09 LAB — GLUCOSE, CAPILLARY
Glucose-Capillary: 145 mg/dL — ABNORMAL HIGH (ref 70–99)
Glucose-Capillary: 143 mg/dL — ABNORMAL HIGH (ref 70–99)

## 2020-11-09 NOTE — Progress Notes (Signed)
QUALITY OF LIFE SCORE REVIEW  Pt completed Quality of Life survey as a participant in Cardiac Rehab.  Scores 19.0 or below are considered low.  Pt score very low in several areas Overall 19.14, Health and Function 19.60, socioeconomic 16.50, physiological and spiritual 18.86, family 22.50. Patient quality of life slightly altered by physical constraints which limits ability to perform as prior to recent cardiac illness. [name] admits to being dissatisfied with his health due to his ESRD. [name] hopes that participating in cardiac rehab will help his chances of getting a kidney transplant in the future .  Offered emotional support and reassurance.  Will continue to monitor and intervene as necessary. Will forward Lamont's quality of life questionnaire to primary care provider, Dr Loa Socks.Barnet Pall, RN,BSN 11/09/2020 2:21 PM

## 2020-11-12 ENCOUNTER — Encounter (HOSPITAL_COMMUNITY)
Admission: RE | Admit: 2020-11-12 | Discharge: 2020-11-12 | Disposition: A | Discharge status: Home / Self Care | Payer: Medicare Other | Source: Ambulatory Visit | Attending: Cardiology | Admitting: Cardiology

## 2020-11-12 ENCOUNTER — Other Ambulatory Visit: Payer: Self-pay

## 2020-11-12 DIAGNOSIS — I208 Other forms of angina pectoris: Secondary | ICD-10-CM

## 2020-11-12 DIAGNOSIS — I10 Essential (primary) hypertension: Secondary | ICD-10-CM | POA: Diagnosis not present

## 2020-11-12 DIAGNOSIS — I25118 Atherosclerotic heart disease of native coronary artery with other forms of angina pectoris: Secondary | ICD-10-CM | POA: Diagnosis not present

## 2020-11-14 ENCOUNTER — Encounter (HOSPITAL_COMMUNITY)
Admission: RE | Admit: 2020-11-14 | Discharge: 2020-11-14 | Disposition: A | Discharge status: Home / Self Care | Payer: Medicare Other | Source: Ambulatory Visit | Attending: Cardiology | Admitting: Cardiology

## 2020-11-14 ENCOUNTER — Other Ambulatory Visit: Payer: Self-pay

## 2020-11-14 DIAGNOSIS — I208 Other forms of angina pectoris: Secondary | ICD-10-CM

## 2020-11-14 DIAGNOSIS — I25118 Atherosclerotic heart disease of native coronary artery with other forms of angina pectoris: Secondary | ICD-10-CM | POA: Diagnosis not present

## 2020-11-16 ENCOUNTER — Other Ambulatory Visit: Payer: Self-pay

## 2020-11-16 ENCOUNTER — Encounter (HOSPITAL_COMMUNITY)
Admission: RE | Admit: 2020-11-16 | Discharge: 2020-11-16 | Disposition: A | Discharge status: Home / Self Care | Payer: Medicare Other | Source: Ambulatory Visit | Attending: Cardiology | Admitting: Cardiology

## 2020-11-16 DIAGNOSIS — I208 Other forms of angina pectoris: Secondary | ICD-10-CM

## 2020-11-16 DIAGNOSIS — I25118 Atherosclerotic heart disease of native coronary artery with other forms of angina pectoris: Secondary | ICD-10-CM | POA: Diagnosis not present

## 2020-11-16 DIAGNOSIS — I2089 Other forms of angina pectoris: Secondary | ICD-10-CM

## 2020-11-19 ENCOUNTER — Encounter (HOSPITAL_COMMUNITY)
Admission: RE | Admit: 2020-11-19 | Discharge: 2020-11-19 | Disposition: A | Discharge status: Home / Self Care | Payer: Medicare Other | Source: Ambulatory Visit | Attending: Cardiology | Admitting: Cardiology

## 2020-11-19 ENCOUNTER — Other Ambulatory Visit: Payer: Self-pay

## 2020-11-19 DIAGNOSIS — I25118 Atherosclerotic heart disease of native coronary artery with other forms of angina pectoris: Secondary | ICD-10-CM | POA: Diagnosis not present

## 2020-11-19 DIAGNOSIS — I208 Other forms of angina pectoris: Secondary | ICD-10-CM

## 2020-11-21 ENCOUNTER — Encounter (HOSPITAL_COMMUNITY)
Admission: RE | Admit: 2020-11-21 | Discharge: 2020-11-21 | Disposition: A | Discharge status: Home / Self Care | Payer: Medicare Other | Source: Ambulatory Visit | Attending: Cardiology | Admitting: Cardiology

## 2020-11-21 ENCOUNTER — Other Ambulatory Visit: Payer: Self-pay

## 2020-11-21 DIAGNOSIS — I25118 Atherosclerotic heart disease of native coronary artery with other forms of angina pectoris: Secondary | ICD-10-CM | POA: Diagnosis not present

## 2020-11-21 DIAGNOSIS — I208 Other forms of angina pectoris: Secondary | ICD-10-CM

## 2020-11-23 ENCOUNTER — Other Ambulatory Visit: Payer: Self-pay

## 2020-11-23 ENCOUNTER — Encounter (HOSPITAL_COMMUNITY)
Admission: RE | Admit: 2020-11-23 | Discharge: 2020-11-23 | Disposition: A | Discharge status: Home / Self Care | Payer: Medicare Other | Source: Ambulatory Visit | Attending: Cardiology | Admitting: Cardiology

## 2020-11-23 DIAGNOSIS — I25118 Atherosclerotic heart disease of native coronary artery with other forms of angina pectoris: Secondary | ICD-10-CM | POA: Diagnosis not present

## 2020-11-23 DIAGNOSIS — I208 Other forms of angina pectoris: Secondary | ICD-10-CM

## 2020-11-26 ENCOUNTER — Encounter (HOSPITAL_COMMUNITY)
Admission: RE | Admit: 2020-11-26 | Discharge: 2020-11-26 | Disposition: A | Discharge status: Home / Self Care | Payer: Medicare Other | Source: Ambulatory Visit | Attending: Cardiology | Admitting: Cardiology

## 2020-11-26 ENCOUNTER — Other Ambulatory Visit: Payer: Self-pay

## 2020-11-26 DIAGNOSIS — I25118 Atherosclerotic heart disease of native coronary artery with other forms of angina pectoris: Secondary | ICD-10-CM | POA: Diagnosis not present

## 2020-11-26 DIAGNOSIS — I208 Other forms of angina pectoris: Secondary | ICD-10-CM

## 2020-11-26 DIAGNOSIS — I2089 Other forms of angina pectoris: Secondary | ICD-10-CM

## 2020-11-27 NOTE — Progress Notes (Signed)
Cardiac Individual Treatment Plan  Patient Details  Name: [name] MRN: [id number] Date of Birth: [birth year] Referring Provider:   Flowsheet Row CARDIAC REHAB PHASE II ORIENTATION from 11/01/2020 in [place]  Referring Provider Dr [name] MD at [facility] / Dr [name] MD, Covering       Initial Encounter Date:  [place] from 11/01/2020 in [place]  Date 11/01/20       Visit Diagnosis: Stable angina ([place])  Patient's Home Medications on Admission:  Current Outpatient Medications:    amLODipine (NORVASC) 10 MG tablet, Take 1 tablet (10 mg total) by mouth daily. (Patient not taking: No sig reported), Disp: 30 tablet, Rfl: 0   anastrozole (ARIMIDEX) 1 MG tablet, Take 1 mg by mouth every other day., Disp: , Rfl:    aspirin 81 MG EC tablet, Take 81 mg by mouth daily., Disp: , Rfl:    atorvastatin (LIPITOR) 20 MG tablet, TAKE ONE TABLET BY MOUTH ONCE DAILY AT BEDTIME (Patient not taking: Reported on 10/24/2020), Disp: 90 tablet, Rfl: 3   atorvastatin (LIPITOR) 40 MG tablet, Take 40 mg by mouth daily., Disp: , Rfl:    blood glucose meter kit and supplies KIT, Dispense based on patient and insurance preference. Use QD-BID home glucose monitoring. (FOR ICD-9 250.00, 250.01)., Disp: 1 each, Rfl: 0   BRILINTA 90 MG TABS tablet, Take 90 mg by mouth 2 (two) times daily., Disp: , Rfl:    carvedilol (COREG) 12.5 MG tablet, Take 12.5 mg by mouth 2 (two) times daily with a meal., Disp: , Rfl:    carvedilol (COREG) 25 MG tablet, TAKE 1 TABLET BY MOUTH TWICE DAILY (Patient not taking: Reported on 10/24/2020), Disp: 180 tablet, Rfl: 3   cloNIDine (CATAPRES) 0.2 MG tablet, Take 1 tablet (0.2 mg total) by mouth 3 (three) times daily. (Patient not taking: No sig reported), Disp: 90 tablet, Rfl: 0   ezetimibe (ZETIA) 10 MG tablet, Take 10 mg by mouth daily., Disp: , Rfl:    gabapentin  (NEURONTIN) 100 MG capsule, Take 100 mg by mouth 3 (three) times daily., Disp: , Rfl:    glimepiride (AMARYL) 1 MG tablet, Take 1 mg by mouth daily., Disp: , Rfl:    glimepiride (AMARYL) 2 MG tablet, TAKE 2 MG BY MOUTH ONCE DAILY (Patient not taking: No sig reported), Disp: 30 tablet, Rfl: 0   hydrALAZINE (APRESOLINE) 50 MG tablet, Take 1 tablet (50 mg total) by mouth every 8 (eight) hours. (Patient not taking: No sig reported), Disp: 90 tablet, Rfl: 0   HYDROcodone-acetaminophen (NORCO) 5-325 MG tablet, Take 1 tablet by mouth every 6 (six) hours as needed for moderate pain. (Patient not taking: No sig reported), Disp: 8 tablet, Rfl: 0   icosapent Ethyl (VASCEPA) 1 g capsule, Take 2 g by mouth 2 (two) times daily., Disp: , Rfl:    nitroGLYCERIN (NITROSTAT) 0.4 MG SL tablet, DISSOLVE ONE TABLET UNDER THE TONGUE EVERY 5 MINUTES AS NEEDED FOR CHEST PAIN.  DO NOT EXCEED A TOTAL OF 3 DOSES IN 15 MINUTES (Patient taking differently: Place 0.4 mg under the tongue every 5 (five) minutes as needed for chest pain.), Disp: 25 tablet, Rfl: 3   tadalafil (CIALIS) 5 MG tablet, Take 5 mg by mouth daily as needed for erectile dysfunction., Disp: , Rfl:    TRULICITY 1.5 JG/2.8ZM SOPN, Inject 1.5 mg into the skin once a week., Disp: , Rfl:  VELPHORO 500 MG chewable tablet, Chew 1,500 mg by mouth 3 (three) times daily with meals., Disp: , Rfl:   Past Medical History: Past Medical History:  Diagnosis Date   CAD (coronary artery disease)    CHF (congestive heart failure) (HCC)    Chronic kidney disease    Diabetes mellitus    Heart murmur    " slight"   Hyperlipidemia    Hypertension    Sleep apnea    wears CPAP    Tobacco Use: Social History   Tobacco Use  Smoking Status Former   Types: Cigarettes   Quit date: 09/17/2008   Years since quitting: 12.2  Smokeless Tobacco Never    Labs: Recent Review Flowsheet Data     Labs for ITP Cardiac and Pulmonary Rehab Latest Ref Rng & Units 11/15/2014  03/25/2015 01/01/2016 08/18/2016 07/06/2017   Cholestrol 0 - 200 mg/dL 213(H) - - 195 104   LDLCALC 0 - 99 mg/dL NOT CALC - - 109(H) 55   LDLDIRECT mg/dL - - - - -   HDL >40 mg/dL 36(L) - - 38(L) 31(L)   Trlycerides <150 mg/dL 735(H) - - 240(H) 88   Hemoglobin A1c 4.8 - 5.6 % 10.5 13.4(H) 10.6 7.5 6.5(H)   PHART 7.350 - 7.450 - 7.348(L) - - -   PCO2ART 35.0 - 45.0 mmHg - 40.0 - - -   HCO3 20.0 - 24.0 mEq/L - 22.2 - - -   TCO2 0 - 100 mmol/L - 23 - - -   ACIDBASEDEF 0.0 - 2.0 mmol/L - 3.0(H) - - -   O2SAT % - 97.0 - - -       Capillary Blood Glucose: Lab Results  Component Value Date   GLUCAP 143 (H) 11/09/2020   GLUCAP 145 (H) 11/09/2020   GLUCAP 164 (H) 11/07/2020   GLUCAP 146 (H) 11/05/2020   GLUCAP 164 (H) 11/05/2020     Exercise Target Goals: Exercise Program Goal: Individual exercise prescription set using results from initial 6 min walk test and THRR while considering  patient's activity barriers and safety.   Exercise Prescription Goal: Starting with aerobic activity 30 plus minutes a day, 3 days per week for initial exercise prescription. Provide home exercise prescription and guidelines that participant acknowledges understanding prior to discharge.  Activity Barriers & Risk Stratification:  Activity Barriers & Cardiac Risk Stratification - 11/01/20 1446       Activity Barriers & Cardiac Risk Stratification   Activity Barriers Deconditioning;Chest Pain/Angina;Balance Concerns;Other (comment)    Comments Poor vision due to cataracts    Cardiac Risk Stratification High             6 Minute Walk:  6 Minute Walk     Row Name 11/01/20 1411         6 Minute Walk   Phase Initial     Distance 1372 feet     Walk Time 6 minutes     # of Rest Breaks 0     MPH 2.6     METS 4.25     RPE 11     Perceived Dyspnea  0     VO2 Peak 14.86     Symptoms No     Resting HR 96 bpm     Resting BP 148/76     Resting Oxygen Saturation  97 %     Exercise Oxygen  Saturation  during 6 min walk 96 %     Max Ex. HR 116 bpm  Max Ex. BP 158/90     2 Minute Post BP 152/86              Oxygen Initial Assessment:   Oxygen Re-Evaluation:   Oxygen Discharge (Final Oxygen Re-Evaluation):   Initial Exercise Prescription:  Initial Exercise Prescription - 11/01/20 1500       Date of Initial Exercise RX and Referring Provider   Date 11/01/20    Referring Provider Dr [name] MD at [facility] / Dr [name] MD, Covering    Expected Discharge Date 12/28/20      Recumbant Bike   Level 2    RPM 60    Minutes 15    METs 2.1      NuStep   Level 2    SPM 85    Minutes 15    METs 2.2      Prescription Details   Frequency (times per week) 3    Duration Progress to 30 minutes of continuous aerobic without signs/symptoms of physical distress      Intensity   THRR 40-80% of Max Heartrate 70-140    Ratings of Perceived Exertion 11-13    Perceived Dyspnea 0-4      Progression   Progression Continue progressive overload as per policy without signs/symptoms or physical distress.      Resistance Training   Training Prescription Yes    Weight 4    Reps 10-15             Perform Capillary Blood Glucose checks as needed.  Exercise Prescription Changes:   Exercise Prescription Changes     Row Name 11/05/20 1600 11/23/20 1500           Response to Exercise   Blood Pressure (Admit) 144/80 139/74      Blood Pressure (Exercise) 140/78 122/78      Blood Pressure (Exit) 134/70 108/64      Heart Rate (Admit) 97 bpm 88 bpm      Heart Rate (Exercise) 107 bpm 112 bpm      Heart Rate (Exit) 95 bpm 98 bpm      Rating of Perceived Exertion (Exercise) 12 12      Symptoms None None      Comments Pt's first day in the CRP2 program Reviewed METs      Duration Continue with 30 min of aerobic exercise without signs/symptoms of physical distress. Continue with 30 min of aerobic exercise without signs/symptoms of physical distress.       Intensity THRR unchanged THRR unchanged             Progression   Progression Continue to progress workloads to maintain intensity without signs/symptoms of physical distress. Continue to progress workloads to maintain intensity without signs/symptoms of physical distress.      Average METs 2 2.5             Resistance Training   Training Prescription Yes Yes      Weight 4 4      Reps 10-15 10-15      Time 10 Minutes 10 Minutes             Interval Training   Interval Training No No             Recumbant Bike   Level 2 --      RPM 60 --      Minutes 15 --      METs 2 --  NuStep   Level 2 3      SPM 80 90      Minutes 15 30      METs 2 2.5               Exercise Comments:   Exercise Comments     Row Name 11/05/20 1625 11/23/20 1500         Exercise Comments Pt's first day in hte CRP2 program. Pt tolerated session well with no complaints. Reviewed METs with patient today. Pt is making good progress in the CRP2 progam.               Exercise Goals and Review:   Exercise Goals     Row Name 11/01/20 1447             Exercise Goals   Increase Physical Activity Yes       Intervention Provide advice, education, support and counseling about physical activity/exercise needs.;Develop an individualized exercise prescription for aerobic and resistive training based on initial evaluation findings, risk stratification, comorbidities and participant's personal goals.       Expected Outcomes Short Term: Attend rehab on a regular basis to increase amount of physical activity.;Long Term: Add in home exercise to make exercise part of routine and to increase amount of physical activity.;Long Term: Exercising regularly at least 3-5 days a week.       Increase Strength and Stamina Yes       Intervention Provide advice, education, support and counseling about physical activity/exercise needs.;Develop an individualized exercise prescription for aerobic and  resistive training based on initial evaluation findings, risk stratification, comorbidities and participant's personal goals.       Expected Outcomes Short Term: Increase workloads from initial exercise prescription for resistance, speed, and METs.;Short Term: Perform resistance training exercises routinely during rehab and add in resistance training at home;Long Term: Improve cardiorespiratory fitness, muscular endurance and strength as measured by increased METs and functional capacity (6MWT)       Able to understand and use rate of perceived exertion (RPE) scale Yes       Intervention Provide education and explanation on how to use RPE scale       Expected Outcomes Short Term: Able to use RPE daily in rehab to express subjective intensity level;Long Term:  Able to use RPE to guide intensity level when exercising independently       Knowledge and understanding of Target Heart Rate Range (THRR) Yes       Intervention Provide education and explanation of THRR including how the numbers were predicted and where they are located for reference       Expected Outcomes Short Term: Able to state/look up THRR;Short Term: Able to use daily as guideline for intensity in rehab;Long Term: Able to use THRR to govern intensity when exercising independently       Understanding of Exercise Prescription Yes       Intervention Provide education, explanation, and written materials on patient's individual exercise prescription       Expected Outcomes Short Term: Able to explain program exercise prescription;Long Term: Able to explain home exercise prescription to exercise independently                Exercise Goals Re-Evaluation :  Exercise Goals Re-Evaluation     Row Name 11/05/20 1624             Exercise Goal Re-Evaluation   Exercise Goals Review Increase Physical Activity;Increase Strength and Stamina;Able to  understand and use rate of perceived exertion (RPE) scale;Knowledge and understanding of Target  Heart Rate Range (THRR);Understanding of Exercise Prescription       Comments Pt's first day in the CRP2 program. Pt understands the Exercise Rx, RPE scale and THRR.       Expected Outcomes Will continue to monitor patient and progress exercise workloads as tolerated.                 Discharge Exercise Prescription (Final Exercise Prescription Changes):  Exercise Prescription Changes - 11/23/20 1500       Response to Exercise   Blood Pressure (Admit) 139/74    Blood Pressure (Exercise) 122/78    Blood Pressure (Exit) 108/64    Heart Rate (Admit) 88 bpm    Heart Rate (Exercise) 112 bpm    Heart Rate (Exit) 98 bpm    Rating of Perceived Exertion (Exercise) 12    Symptoms None    Comments Reviewed METs    Duration Continue with 30 min of aerobic exercise without signs/symptoms of physical distress.    Intensity THRR unchanged      Progression   Progression Continue to progress workloads to maintain intensity without signs/symptoms of physical distress.    Average METs 2.5      Resistance Training   Training Prescription Yes    Weight 4    Reps 10-15    Time 10 Minutes      Interval Training   Interval Training No      NuStep   Level 3    SPM 90    Minutes 30    METs 2.5             Nutrition:  Target Goals: Understanding of nutrition guidelines, daily intake of sodium '1500mg'$ , cholesterol '200mg'$ , calories 30% from fat and 7% or less from saturated fats, daily to have 5 or more servings of fruits and vegetables.  Biometrics:  Pre Biometrics - 11/01/20 1315       Pre Biometrics   Waist Circumference 47 inches    Hip Circumference 44 inches    Waist to Hip Ratio 1.07 %    Triceps Skinfold 24 mm    % Body Fat 35.6 %    Grip Strength 35 kg    Flexibility 0 in   cannot reach   Single Leg Stand 5.26 seconds              Nutrition Therapy Plan and Nutrition Goals:  Nutrition Therapy & Goals - 11/07/20 1456       Nutrition Therapy   Diet TLC       Personal Nutrition Goals   Nutrition Goal Pt to read labels and reduce sodium intake to <2000 mg/day    Personal Goal #2 Pt to eat out only 2 times per week    Personal Goal #3 Pt to build a healthy plate including vegetables, fruits, whole grains, and low-fat dairy products in a heart healthy meal plan      Intervention Plan   Intervention Prescribe, educate and counsel regarding individualized specific dietary modifications aiming towards targeted core components such as weight, hypertension, lipid management, diabetes, heart failure and other comorbidities.;Nutrition handout(s) given to patient.    Expected Outcomes Long Term Goal: Adherence to prescribed nutrition plan.;Short Term Goal: Understand basic principles of dietary content, such as calories, fat, sodium, cholesterol and nutrients.             Nutrition Assessments:  MEDIFICTS Score Key: ?70 Need  to make dietary changes  40-70 Heart Healthy Diet ? 40 Therapeutic Level Cholesterol Diet  Flowsheet Row CARDIAC REHAB PHASE II EXERCISE from 11/07/2020 in [place]  Picture Your Plate Total Score on Admission 64      Picture Your Plate Scores: <93 Unhealthy dietary pattern with much room for improvement. 41-50 Dietary pattern unlikely to meet recommendations for good health and room for improvement. 51-60 More healthful dietary pattern, with some room for improvement.  >60 Healthy dietary pattern, although there may be some specific behaviors that could be improved.    Nutrition Goals Re-Evaluation:  Nutrition Goals Re-Evaluation     [place] Name 11/07/20 1456 11/22/20 1116           Goals   Current Weight 234 lb (106.1 kg) 229 lb 8 oz (104.1 kg)      Nutrition Goal -- Pt to read labels and reduce sodium intake to <2000 mg/day      Comment -- Pt has lost 5 lbs             Personal Goal #2 Re-Evaluation   Personal Goal #2 -- Pt to eat out only 2 times per week              Personal Goal #3 Re-Evaluation   Personal Goal #3 -- Pt to build a healthy plate including vegetables, fruits, whole grains, and low-fat dairy products in a heart healthy meal plan               Nutrition Goals Discharge (Final Nutrition Goals Re-Evaluation):  Nutrition Goals Re-Evaluation - 11/22/20 1116       Goals   Current Weight 229 lb 8 oz (104.1 kg)    Nutrition Goal Pt to read labels and reduce sodium intake to <2000 mg/day    Comment Pt has lost 5 lbs      Personal Goal #2 Re-Evaluation   Personal Goal #2 Pt to eat out only 2 times per week      Personal Goal #3 Re-Evaluation   Personal Goal #3 Pt to build a healthy plate including vegetables, fruits, whole grains, and low-fat dairy products in a heart healthy meal plan             Psychosocial: Target Goals: Acknowledge presence or absence of significant depression and/or stress, maximize coping skills, provide positive support system. Participant is able to verbalize types and ability to use techniques and skills needed for reducing stress and depression.  Initial Review & Psychosocial Screening:  Initial Psych Review & Screening - 11/01/20 1459       Initial Review   Current issues with Current Stress Concerns    Source of Stress Concerns Chronic Illness;Retirement/disability    Comments [name] is on HD and is on disability due to ESRD.      Family Dynamics   Good Support System? Yes      Barriers   Psychosocial barriers to participate in program The patient should benefit from training in stress management and relaxation.      Screening Interventions   Interventions Encouraged to exercise;Provide feedback about the scores to participant;To provide support and resources with identified psychosocial needs    Expected Outcomes Long Term Goal: Stressors or current issues are controlled or eliminated.;Short Term goal: Identification and review with participant of any Quality of Life or Depression concerns  found by scoring the questionnaire.             Quality of Life  Scores:  Quality of Life - 11/01/20 1416       Quality of Life   Select Quality of Life      Quality of Life Scores   Health/Function Pre 19.6 %    Socioeconomic Pre 16.5 %    Psych/Spiritual Pre 18.86 %    Family Pre 22.5 %    GLOBAL Pre 19.14 %            Scores of 19 and below usually indicate a poorer quality of life in these areas.  A difference of  2-3 points is a clinically meaningful difference.  A difference of 2-3 points in the total score of the Quality of Life Index has been associated with significant improvement in overall quality of life, self-image, physical symptoms, and general health in studies assessing change in quality of life.  PHQ-9: Recent Review Flowsheet Data     Depression screen [facility] 2/9 11/01/2020 08/29/2016 08/18/2016 02/14/2016 01/01/2016   Decreased Interest 0 0 0 0 0   Down, Depressed, Hopeless 0 0 0 0 0   PHQ - 2 Score 0 0 0 0 0      Interpretation of Total Score  Total Score Depression Severity:  1-4 = Minimal depression, 5-9 = Mild depression, 10-14 = Moderate depression, 15-19 = Moderately severe depression, 20-27 = Severe depression   Psychosocial Evaluation and Intervention:   Psychosocial Re-Evaluation:  Psychosocial Re-Evaluation     [place] Name 11/27/20 1228             Psychosocial Re-Evaluation   Current issues with Current Stress Concerns       Comments [name] has not voiced any increased stressors since participation in phase 2 cardiac rehab. Quality of life reviewed on 11/13/20 and forwarded to Dr Loa Socks, [place]'s PCP       Expected Outcomes [name] will have decreased or controlled stress upon competion of phase 2 cardiac rehab.       Interventions Encouraged to attend Cardiac Rehabilitation for the exercise       Continue Psychosocial Services  Follow up required by staff               Initial Review   Source of Stress Concerns Chronic  Illness;Retirement/disability;Unable to participate in former interests or hobbies;Unable to perform yard/household activities       Comments Will contnue to monitor and offer support as needed.                Psychosocial Discharge (Final Psychosocial Re-Evaluation):  Psychosocial Re-Evaluation - 11/27/20 1228       Psychosocial Re-Evaluation   Current issues with Current Stress Concerns    Comments [name] has not voiced any increased stressors since participation in phase 2 cardiac rehab. Quality of life reviewed on 11/13/20 and forwarded to Dr Loa Socks, [place] PCP    Expected Outcomes [name] will have decreased or controlled stress upon competion of phase 2 cardiac rehab.    Interventions Encouraged to attend Cardiac Rehabilitation for the exercise    Continue Psychosocial Services  Follow up required by staff      Initial Review   Source of Stress Concerns Chronic Illness;Retirement/disability;Unable to participate in former interests or hobbies;Unable to perform yard/household activities    Comments Will contnue to monitor and offer support as needed.             Vocational Rehabilitation: Provide vocational rehab assistance to qualifying candidates.   Vocational Rehab Evaluation & Intervention:  Vocational Rehab - 11/01/20 1520  Initial Vocational Rehab Evaluation & Intervention   Assessment shows need for Vocational Rehabilitation No   [name] is on disability and does not need vocational rehab at this time            Education: Education Goals: Education classes will be provided on a weekly basis, covering required topics. Participant will state understanding/return demonstration of topics presented.  Learning Barriers/Preferences:  Learning Barriers/Preferences - 11/01/20 1456       Learning Barriers/Preferences   Learning Barriers Sight   wears glasses, has cataracts   Learning Preferences Computer/Internet;Group Instruction;Individual  Instruction;Pictoral;Skilled Demonstration;Video             Education Topics: Hypertension, Hypertension Reduction -Define heart disease and high blood pressure. Discus how high blood pressure affects the body and ways to reduce high blood pressure.   Exercise and Your Heart -Discuss why it is important to exercise, the FITT principles of exercise, normal and abnormal responses to exercise, and how to exercise safely.   Angina -Discuss definition of angina, causes of angina, treatment of angina, and how to decrease risk of having angina.   Cardiac Medications -Review what the following cardiac medications are used for, how they affect the body, and side effects that may occur when taking the medications.  Medications include Aspirin, Beta blockers, calcium channel blockers, ACE Inhibitors, angiotensin receptor blockers, diuretics, digoxin, and antihyperlipidemics.   Congestive Heart Failure -Discuss the definition of CHF, how to live with CHF, the signs and symptoms of CHF, and how keep track of weight and sodium intake.   Heart Disease and Intimacy -Discus the effect sexual activity has on the heart, how changes occur during intimacy as we age, and safety during sexual activity.   Smoking Cessation / COPD -Discuss different methods to quit smoking, the health benefits of quitting smoking, and the definition of COPD.   Nutrition I: Fats -Discuss the types of cholesterol, what cholesterol does to the heart, and how cholesterol levels can be controlled.   Nutrition II: Labels -Discuss the different components of food labels and how to read food label   Heart Parts/Heart Disease and PAD -Discuss the anatomy of the heart, the pathway of blood circulation through the heart, and these are affected by heart disease.   Stress I: Signs and Symptoms -Discuss the causes of stress, how stress may lead to anxiety and depression, and ways to limit stress.   Stress II:  Relaxation -Discuss different types of relaxation techniques to limit stress.   Warning Signs of Stroke / TIA -Discuss definition of a stroke, what the signs and symptoms are of a stroke, and how to identify when someone is having stroke.   Knowledge Questionnaire Score:  Knowledge Questionnaire Score - 11/01/20 1418       Knowledge Questionnaire Score   Pre Score 20/24             Core Components/Risk Factors/Patient Goals at Admission:  Personal Goals and Risk Factors at Admission - 11/01/20 1459       Core Components/Risk Factors/Patient Goals on Admission    Weight Management Yes;Obesity;Weight Loss    Intervention Weight Management: Develop a combined nutrition and exercise program designed to reach desired caloric intake, while maintaining appropriate intake of nutrient and fiber, sodium and fats, and appropriate energy expenditure required for the weight goal.;Weight Management: Provide education and appropriate resources to help participant work on and attain dietary goals.;Weight Management/Obesity: Establish reasonable short term and long term weight goals.;Obesity: Provide education and appropriate resources  to help participant work on and attain dietary goals.    Admit Weight 234 lb 5.6 oz (106.3 kg)    Expected Outcomes Short Term: Continue to assess and modify interventions until short term weight is achieved;Long Term: Adherence to nutrition and physical activity/exercise program aimed toward attainment of established weight goal;Weight Maintenance: Understanding of the daily nutrition guidelines, which includes 25-35% calories from fat, 7% or less cal from saturated fats, less than $RemoveB'200mg'qwoToglA$  cholesterol, less than 1.5gm of sodium, & 5 or more servings of fruits and vegetables daily;Weight Loss: Understanding of general recommendations for a balanced deficit meal plan, which promotes 1-2 lb weight loss per week and includes a negative energy balance of [phone]  kcal/d;Understanding recommendations for meals to include 15-35% energy as protein, 25-35% energy from fat, 35-60% energy from carbohydrates, less than $RemoveB'200mg'nVsmnUjs$  of dietary cholesterol, 20-35 gm of total fiber daily;Understanding of distribution of calorie intake throughout the day with the consumption of 4-5 meals/snacks    Diabetes Yes    Intervention Provide education about signs/symptoms and action to take for hypo/hyperglycemia.;Provide education about proper nutrition, including hydration, and aerobic/resistive exercise prescription along with prescribed medications to achieve blood glucose in normal ranges: Fasting glucose 65-99 mg/dL    Expected Outcomes Short Term: Participant verbalizes understanding of the signs/symptoms and immediate care of hyper/hypoglycemia, proper foot care and importance of medication, aerobic/resistive exercise and nutrition plan for blood glucose control.;Long Term: Attainment of HbA1C < 7%.    Hypertension Yes    Intervention Provide education on lifestyle modifcations including regular physical activity/exercise, weight management, moderate sodium restriction and increased consumption of fresh fruit, vegetables, and low fat dairy, alcohol moderation, and smoking cessation.;Monitor prescription use compliance.    Expected Outcomes Short Term: Continued assessment and intervention until BP is < 140/15mm HG in hypertensive participants. < 130/26mm HG in hypertensive participants with diabetes, heart failure or chronic kidney disease.;Long Term: Maintenance of blood pressure at goal levels.    Lipids Yes    Intervention Provide education and support for participant on nutrition & aerobic/resistive exercise along with prescribed medications to achieve LDL '70mg'$ , HDL >$Remo'40mg'Dksmc$ .    Expected Outcomes Short Term: Participant states understanding of desired cholesterol values and is compliant with medications prescribed. Participant is following exercise prescription and nutrition  guidelines.;Long Term: Cholesterol controlled with medications as prescribed, with individualized exercise RX and with personalized nutrition plan. Value goals: LDL < $Rem'70mg'AHYO$ , HDL > 40 mg.    Stress Yes    Intervention Offer individual and/or small group education and counseling on adjustment to heart disease, stress management and health-related lifestyle change. Teach and support self-help strategies.;Refer participants experiencing significant psychosocial distress to appropriate mental health specialists for further evaluation and treatment. When possible, include family members and significant others in education/counseling sessions.    Expected Outcomes Short Term: Participant demonstrates changes in health-related behavior, relaxation and other stress management skills, ability to obtain effective social support, and compliance with psychotropic medications if prescribed.;Long Term: Emotional wellbeing is indicated by absence of clinically significant psychosocial distress or social isolation.             Core Components/Risk Factors/Patient Goals Review:   Goals and Risk Factor Review     Row Name 11/27/20 1236             Core Components/Risk Factors/Patient Goals Review   Personal Goals Review Weight Management/Obesity;Stress;Hypertension;Lipids;Diabetes       Review Lamont's vital signs and CBG's have been stable. [name] continues to do well with  exercise for his fitness level.       Expected Outcomes [name] will continue to participate in phase 2 cardiac rehab for exercise, nutrition and lifestyle modifications.                Core Components/Risk Factors/Patient Goals at Discharge (Final Review):   Goals and Risk Factor Review - 11/27/20 1236       Core Components/Risk Factors/Patient Goals Review   Personal Goals Review Weight Management/Obesity;Stress;Hypertension;Lipids;Diabetes    Review Lamont's vital signs and CBG's have been stable. [name] continues to do well  with exercise for his fitness level.    Expected Outcomes [name] will continue to participate in phase 2 cardiac rehab for exercise, nutrition and lifestyle modifications.             ITP Comments:  ITP Comments     Row Name 11/01/20 1458 11/27/20 1223         ITP Comments Dr [name] MD, Medical Director 30 Day ITP Review. [name] has good attendance and participaiton in phase 2 cardiac rehab. [name] will be out next week for cataract surgery.               Comments: See ITP comments.Barnet Pall, RN,BSN 11/27/2020 12:40 PM

## 2020-11-28 ENCOUNTER — Encounter (HOSPITAL_COMMUNITY): Payer: Medicare Other

## 2020-11-30 ENCOUNTER — Encounter (HOSPITAL_COMMUNITY): Payer: Medicare Other

## 2020-12-03 ENCOUNTER — Encounter (HOSPITAL_COMMUNITY): Payer: Medicare Other

## 2020-12-05 ENCOUNTER — Encounter (HOSPITAL_COMMUNITY): Payer: Medicare Other

## 2020-12-05 ENCOUNTER — Telehealth (HOSPITAL_COMMUNITY): Payer: Self-pay | Admitting: *Deleted

## 2020-12-05 NOTE — Telephone Encounter (Signed)
Spoke with [employer]. [name] says he has clearance to return to exercise at cardiac rehab on Friday. I asked [name] to bring a note from the eye surgeon upon return to exercise. Patient states understanding.Barnet Pall, RN,BSN 12/05/2020 2:12 PM

## 2020-12-07 ENCOUNTER — Encounter (HOSPITAL_COMMUNITY): Payer: Medicare Other

## 2020-12-10 ENCOUNTER — Encounter (HOSPITAL_COMMUNITY)
Admission: RE | Admit: 2020-12-10 | Discharge: 2020-12-10 | Disposition: A | Discharge status: Home / Self Care | Payer: Medicare Other | Source: Ambulatory Visit | Attending: Cardiology | Admitting: Cardiology

## 2020-12-10 ENCOUNTER — Other Ambulatory Visit: Payer: Self-pay

## 2020-12-10 DIAGNOSIS — I25118 Atherosclerotic heart disease of native coronary artery with other forms of angina pectoris: Secondary | ICD-10-CM | POA: Diagnosis not present

## 2020-12-10 DIAGNOSIS — I208 Other forms of angina pectoris: Secondary | ICD-10-CM

## 2020-12-10 DIAGNOSIS — I2089 Other forms of angina pectoris: Secondary | ICD-10-CM

## 2020-12-11 LAB — GLUCOSE, CAPILLARY: Glucose-Capillary: 289 mg/dL — ABNORMAL HIGH (ref 70–99)

## 2020-12-12 ENCOUNTER — Encounter (HOSPITAL_COMMUNITY)
Admission: RE | Admit: 2020-12-12 | Discharge: 2020-12-12 | Disposition: A | Discharge status: Home / Self Care | Payer: Medicare Other | Source: Ambulatory Visit | Attending: Cardiology | Admitting: Cardiology

## 2020-12-12 ENCOUNTER — Other Ambulatory Visit: Payer: Self-pay

## 2020-12-12 ENCOUNTER — Encounter (HOSPITAL_COMMUNITY): Payer: Medicare Other

## 2020-12-12 DIAGNOSIS — I208 Other forms of angina pectoris: Secondary | ICD-10-CM

## 2020-12-12 DIAGNOSIS — I25118 Atherosclerotic heart disease of native coronary artery with other forms of angina pectoris: Secondary | ICD-10-CM | POA: Diagnosis not present

## 2020-12-13 NOTE — Progress Notes (Signed)
Reviewed home exercise Rx with patient today. Encouraged warm-up, cool-down and stretching. Reviewed THRR of 70-140 and keeping RPE between 11-13. Encouraged hydration with activity. Discussed weather parameters for temperature and humidity for safe exercise outdoors. Reviewed S/S to terminate exercise and when to call MD vs 911. Reviewed use of NTG and encouraged to carry at all times. Reviewed that pat should carry a cell phone if exercising outdoors. Also, he should know what his blood sugar is before beginning exercise and carry a rapid acting glucose snack (glucose tabs). Pt verbalized understanding of the home exercise Rx and was provided a copy.  Lesly Rubenstein MS, ACSM-CEP, CCRP

## 2020-12-14 ENCOUNTER — Encounter (HOSPITAL_COMMUNITY): Payer: Medicare Other

## 2020-12-14 ENCOUNTER — Encounter (HOSPITAL_COMMUNITY)
Admission: RE | Admit: 2020-12-14 | Discharge: 2020-12-14 | Disposition: A | Discharge status: Home / Self Care | Payer: Medicare Other | Source: Ambulatory Visit | Attending: Cardiology | Admitting: Cardiology

## 2020-12-14 ENCOUNTER — Other Ambulatory Visit: Payer: Self-pay

## 2020-12-14 DIAGNOSIS — I208 Other forms of angina pectoris: Secondary | ICD-10-CM | POA: Insufficient documentation

## 2020-12-19 ENCOUNTER — Encounter (HOSPITAL_COMMUNITY)
Admission: RE | Admit: 2020-12-19 | Discharge: 2020-12-19 | Disposition: A | Discharge status: Home / Self Care | Payer: Medicare Other | Source: Ambulatory Visit | Attending: Cardiology | Admitting: Cardiology

## 2020-12-19 ENCOUNTER — Other Ambulatory Visit: Payer: Self-pay

## 2020-12-19 ENCOUNTER — Encounter (HOSPITAL_COMMUNITY): Payer: Medicare Other

## 2020-12-19 DIAGNOSIS — I208 Other forms of angina pectoris: Secondary | ICD-10-CM

## 2020-12-21 ENCOUNTER — Other Ambulatory Visit: Payer: Self-pay

## 2020-12-21 ENCOUNTER — Encounter (HOSPITAL_COMMUNITY): Payer: Medicare Other

## 2020-12-21 ENCOUNTER — Encounter (HOSPITAL_COMMUNITY)
Admission: RE | Admit: 2020-12-21 | Discharge: 2020-12-21 | Disposition: A | Discharge status: Home / Self Care | Payer: Medicare Other | Source: Ambulatory Visit | Attending: Cardiology | Admitting: Cardiology

## 2020-12-21 DIAGNOSIS — I208 Other forms of angina pectoris: Secondary | ICD-10-CM

## 2020-12-24 ENCOUNTER — Encounter (HOSPITAL_COMMUNITY): Payer: Medicare Other

## 2020-12-24 ENCOUNTER — Other Ambulatory Visit: Payer: Self-pay

## 2020-12-24 ENCOUNTER — Encounter (HOSPITAL_COMMUNITY)
Admission: RE | Admit: 2020-12-24 | Discharge: 2020-12-24 | Disposition: A | Discharge status: Home / Self Care | Payer: Medicare Other | Source: Ambulatory Visit | Attending: Cardiology | Admitting: Cardiology

## 2020-12-24 DIAGNOSIS — I208 Other forms of angina pectoris: Secondary | ICD-10-CM

## 2020-12-24 NOTE — Progress Notes (Signed)
Discharge Progress Report  Patient Details  Name: [name] MRN: [id number] Date of Birth: [birth year] Referring Provider:   Flowsheet Row CARDIAC REHAB PHASE II ORIENTATION from 11/01/2020 in [place]  Referring Provider Dr [name] MD at [facility] / Dr [name] MD, Covering        Number of Visits: 16  Reason for Discharge:  Patient reached a stable level of exercise. Patient independent in their exercise. Patient has met program and personal goals.  Smoking History:  Social History   Tobacco Use  Smoking Status Former   Types: Cigarettes   Quit date: 09/17/2008   Years since quitting: 12.3  Smokeless Tobacco Never    Diagnosis:  Stable angina ([place])  ADL [facility]:   Initial Exercise Prescription:  Initial Exercise Prescription - 11/01/20 1500       Date of Initial Exercise RX and Referring Provider   Date 11/01/20    Referring Provider Dr [name] MD at [facility] / Dr [name] MD, Covering    Expected Discharge Date 12/28/20      Recumbant Bike   Level 2    RPM 60    Minutes 15    METs 2.1      NuStep   Level 2    SPM 85    Minutes 15    METs 2.2      Prescription Details   Frequency (times per week) 3    Duration Progress to 30 minutes of continuous aerobic without signs/symptoms of physical distress      Intensity   THRR 40-80% of Max Heartrate 70-140    Ratings of Perceived Exertion 11-13    Perceived Dyspnea 0-4      Progression   Progression Continue progressive overload as per policy without signs/symptoms or physical distress.      Resistance Training   Training Prescription Yes    Weight 4    Reps 10-15             Discharge Exercise Prescription (Final Exercise Prescription Changes):  Exercise Prescription Changes - 12/24/20 1445       Response to Exercise   Blood Pressure (Admit) 120/70    Blood Pressure (Exercise) 128/70    Blood Pressure (Exit) 104/70     Heart Rate (Admit) 93 bpm    Heart Rate (Exercise) 94 bpm    Heart Rate (Exit) 93 bpm    Rating of Perceived Exertion (Exercise) 11    Symptoms None    Comments Pt graduated from the CRP2 program today    Duration Continue with 30 min of aerobic exercise without signs/symptoms of physical distress.    Intensity THRR unchanged      Progression   Progression Continue to progress workloads to maintain intensity without signs/symptoms of physical distress.    Average METs 1.9      Resistance Training   Training Prescription Yes    Weight 4 lbs    Reps 10-15    Time 10 Minutes      NuStep   Level 3    SPM 80    Minutes 30    METs 1.9      Home Exercise Plan   Plans to continue exercise at Home (comment)    Frequency Add 2 additional days to program exercise sessions.    Initial Home Exercises Provided 12/12/20             Functional Capacity:  6 Minute  Walk     Row Name 11/01/20 1411 12/21/20 1320       6 Minute Walk   Phase Initial Discharge    Distance 1372 feet 1400 feet    Distance % Change -- 2.04 %    Distance Feet Change -- 28 ft    Walk Time 6 minutes 6 minutes    # of Rest Breaks 0 0    MPH 2.6 2.65    METS 4.25 4.04    RPE 11 12    Perceived Dyspnea  0 0    VO2 Peak 14.86 14.13    Symptoms No No    Resting HR 96 bpm 100 bpm    Resting BP 148/76 130/70    Resting Oxygen Saturation  97 % 96 %    Exercise Oxygen Saturation  during 6 min walk 96 % 97 %    Max Ex. HR 116 bpm 109 bpm    Max Ex. BP 158/90 130/70    2 Minute Post BP 152/86 --             Psychological, QOL, Others - Outcomes: PHQ 2/9: Depression screen [facility] 2/9 12/24/2020 11/01/2020 08/29/2016 08/18/2016 02/14/2016  Decreased Interest 0 0 0 0 0  Down, Depressed, Hopeless 0 0 0 0 0  PHQ - 2 Score 0 0 0 0 0    Quality of Life:  Quality of Life - 12/21/20 1600       Quality of Life Scores   Health/Function Pre 19.6 %    Health/Function Post 19.21 %    Health/Function % Change  -1.99 %    Socioeconomic Pre 16.5 %    Socioeconomic Post 17.25 %    Socioeconomic % Change  4.55 %    Psych/Spiritual Pre 18.86 %    Psych/Spiritual Post 18.86 %    Psych/Spiritual % Change 0 %    Family Pre 22.5 %    Family Post 21 %    Family % Change -6.67 %    GLOBAL Pre 19.14 %    GLOBAL Post 18.98 %    GLOBAL % Change -0.84 %             Personal Goals: Goals established at orientation with interventions provided to work toward goal.  Personal Goals and Risk Factors at Admission - 11/01/20 1459       Core Components/Risk Factors/Patient Goals on Admission    Weight Management Yes;Obesity;Weight Loss    Intervention Weight Management: Develop a combined nutrition and exercise program designed to reach desired caloric intake, while maintaining appropriate intake of nutrient and fiber, sodium and fats, and appropriate energy expenditure required for the weight goal.;Weight Management: Provide education and appropriate resources to help participant work on and attain dietary goals.;Weight Management/Obesity: Establish reasonable short term and long term weight goals.;Obesity: Provide education and appropriate resources to help participant work on and attain dietary goals.    Admit Weight 234 lb 5.6 oz (106.3 kg)    Expected Outcomes Short Term: Continue to assess and modify interventions until short term weight is achieved;Long Term: Adherence to nutrition and physical activity/exercise program aimed toward attainment of established weight goal;Weight Maintenance: Understanding of the daily nutrition guidelines, which includes 25-35% calories from fat, 7% or less cal from saturated fats, less than 269m cholesterol, less than 1.5gm of sodium, & 5 or more servings of fruits and vegetables daily;Weight Loss: Understanding of general recommendations for a balanced deficit meal plan, which promotes 1-2 lb weight loss  per week and includes a negative energy balance of [phone]  kcal/d;Understanding recommendations for meals to include 15-35% energy as protein, 25-35% energy from fat, 35-60% energy from carbohydrates, less than 227m of dietary cholesterol, 20-35 gm of total fiber daily;Understanding of distribution of calorie intake throughout the day with the consumption of 4-5 meals/snacks    Diabetes Yes    Intervention Provide education about signs/symptoms and action to take for hypo/hyperglycemia.;Provide education about proper nutrition, including hydration, and aerobic/resistive exercise prescription along with prescribed medications to achieve blood glucose in normal ranges: Fasting glucose 65-99 mg/dL    Expected Outcomes Short Term: Participant verbalizes understanding of the signs/symptoms and immediate care of hyper/hypoglycemia, proper foot care and importance of medication, aerobic/resistive exercise and nutrition plan for blood glucose control.;Long Term: Attainment of HbA1C < 7%.    Hypertension Yes    Intervention Provide education on lifestyle modifcations including regular physical activity/exercise, weight management, moderate sodium restriction and increased consumption of fresh fruit, vegetables, and low fat dairy, alcohol moderation, and smoking cessation.;Monitor prescription use compliance.    Expected Outcomes Short Term: Continued assessment and intervention until BP is < 140/912mHG in hypertensive participants. < 130/8069mG in hypertensive participants with diabetes, heart failure or chronic kidney disease.;Long Term: Maintenance of blood pressure at goal levels.    Lipids Yes    Intervention Provide education and support for participant on nutrition & aerobic/resistive exercise along with prescribed medications to achieve LDL <39m51mDL >40mg28m Expected Outcomes Short Term: Participant states understanding of desired cholesterol values and is compliant with medications prescribed. Participant is following exercise prescription and nutrition  guidelines.;Long Term: Cholesterol controlled with medications as prescribed, with individualized exercise RX and with personalized nutrition plan. Value goals: LDL < 39mg,87m > 40 mg.    Stress Yes    Intervention Offer individual and/or small group education and counseling on adjustment to heart disease, stress management and health-related lifestyle change. Teach and support self-help strategies.;Refer participants experiencing significant psychosocial distress to appropriate mental health specialists for further evaluation and treatment. When possible, include family members and significant others in education/counseling sessions.    Expected Outcomes Short Term: Participant demonstrates changes in health-related behavior, relaxation and other stress management skills, ability to obtain effective social support, and compliance with psychotropic medications if prescribed.;Long Term: Emotional wellbeing is indicated by absence of clinically significant psychosocial distress or social isolation.              Personal Goals Discharge:  Goals and Risk Factor Review     Row Name 11/27/20 1236 12/24/20 1443           Core Components/Risk Factors/Patient Goals Review   Personal Goals Review Weight Management/Obesity;Stress;Hypertension;Lipids;Diabetes Weight Management/Obesity;Stress;Hypertension;Lipids;Diabetes      Review Lamont's vital signs and CBG's have been stable. LamontVincente Libertynues to do well with exercise for his fitness level. Lamont's vital signs and CBG's have been stable. Lamont completed phase 2 cardiac rehab on 12/24/20. Lamont did well with exercise for his fitness level      Expected Outcomes LamontVincente Libertycontinue to participate in phase 2 cardiac rehab for exercise, nutrition and lifestyle modifications. LamontVincente Libertycontinue to  exercise, nutrition and lifestyle modifications upon completion of phase 2 cardiac rehab               Exercise Goals and Review:  Exercise Goals      Row Name 11/01/20 1447  Exercise Goals   Increase Physical Activity Yes       Intervention Provide advice, education, support and counseling about physical activity/exercise needs.;Develop an individualized exercise prescription for aerobic and resistive training based on initial evaluation findings, risk stratification, comorbidities and participant's personal goals.       Expected Outcomes Short Term: Attend rehab on a regular basis to increase amount of physical activity.;Long Term: Add in home exercise to make exercise part of routine and to increase amount of physical activity.;Long Term: Exercising regularly at least 3-5 days a week.       Increase Strength and Stamina Yes       Intervention Provide advice, education, support and counseling about physical activity/exercise needs.;Develop an individualized exercise prescription for aerobic and resistive training based on initial evaluation findings, risk stratification, comorbidities and participant's personal goals.       Expected Outcomes Short Term: Increase workloads from initial exercise prescription for resistance, speed, and METs.;Short Term: Perform resistance training exercises routinely during rehab and add in resistance training at home;Long Term: Improve cardiorespiratory fitness, muscular endurance and strength as measured by increased METs and functional capacity (6MWT)       Able to understand and use rate of perceived exertion (RPE) scale Yes       Intervention Provide education and explanation on how to use RPE scale       Expected Outcomes Short Term: Able to use RPE daily in rehab to express subjective intensity level;Long Term:  Able to use RPE to guide intensity level when exercising independently       Knowledge and understanding of Target Heart Rate Range (THRR) Yes       Intervention Provide education and explanation of THRR including how the numbers were predicted and where they are located for reference        Expected Outcomes Short Term: Able to state/look up THRR;Short Term: Able to use daily as guideline for intensity in rehab;Long Term: Able to use THRR to govern intensity when exercising independently       Understanding of Exercise Prescription Yes       Intervention Provide education, explanation, and written materials on patient's individual exercise prescription       Expected Outcomes Short Term: Able to explain program exercise prescription;Long Term: Able to explain home exercise prescription to exercise independently                Exercise Goals Re-Evaluation:  Exercise Goals Re-Evaluation     Row Name 11/05/20 1624 12/12/20 1500 12/24/20 1445 12/25/20 1319       Exercise Goal Re-Evaluation   Exercise Goals Review Increase Physical Activity;Increase Strength and Stamina;Able to understand and use rate of perceived exertion (RPE) scale;Knowledge and understanding of Target Heart Rate Range (THRR);Understanding of Exercise Prescription Increase Physical Activity;Increase Strength and Stamina;Able to understand and use rate of perceived exertion (RPE) scale;Knowledge and understanding of Target Heart Rate Range (THRR);Understanding of Exercise Prescription;Able to check pulse independently Increase Physical Activity;Increase Strength and Stamina;Able to understand and use rate of perceived exertion (RPE) scale;Knowledge and understanding of Target Heart Rate Range (THRR);Able to check pulse independently;Understanding of Exercise Prescription --    Comments Pt's first day in the CRP2 program. Pt understands the Exercise Rx, RPE scale and THRR. Reviewed Home exercise Rx, goals, METs. Pt is not walking at home. He does do strength trainning x2/week with a trainer. Encouraged to also incorporate walking into his home routine. Encouraged walking for 30 minutes 2-3 times/week in  addtion to the CRP2 program. Pt feels he has increased his endurance. Pt graduated from the [place] program. Pt had  a peak MET level of 2.5. Pt is also working out with a trainer 2x/week for strength trainning. --    Expected Outcomes Will continue to monitor patient and progress exercise workloads as tolerated. Pt will wlak at home 2-3 x/week for 30 minutes. Pt will continue to exercise at home/gym. --             Nutrition & Weight - Outcomes:  Pre Biometrics - 11/01/20 1315       Pre Biometrics   Waist Circumference 47 inches    Hip Circumference 44 inches    Waist to Hip Ratio 1.07 %    Triceps Skinfold 24 mm    % Body Fat 35.6 %    Grip Strength 35 kg    Flexibility 0 in   cannot reach   Single Leg Stand 5.26 seconds              Nutrition:  Nutrition Therapy & Goals - 11/07/20 1456       Nutrition Therapy   Diet TLC      Personal Nutrition Goals   Nutrition Goal Pt to read labels and reduce sodium intake to <2000 mg/day    Personal Goal #2 Pt to eat out only 2 times per week    Personal Goal #3 Pt to build a healthy plate including vegetables, fruits, whole grains, and low-fat dairy products in a heart healthy meal plan      Intervention Plan   Intervention Prescribe, educate and counsel regarding individualized specific dietary modifications aiming towards targeted core components such as weight, hypertension, lipid management, diabetes, heart failure and other comorbidities.;Nutrition handout(s) given to patient.    Expected Outcomes Long Term Goal: Adherence to prescribed nutrition plan.;Short Term Goal: Understand basic principles of dietary content, such as calories, fat, sodium, cholesterol and nutrients.             Nutrition Discharge:   Education Questionnaire Score:  Knowledge Questionnaire Score - 12/21/20 1600       Knowledge Questionnaire Score   Post Score 21/24             Goals reviewed with patient; copy given to patient.Lamont graduated from cardiac rehab program today with completion of  exercise sessions in Phase II. Pt maintained good  attendance and progressed nicely during his participation in rehab as evidenced by increased MET level.   Medication list reconciled. Repeat  PHQ score- 0 .  Pt has made significant lifestyle changes and should be commended for his success. Pt feels he has achieved his goals during cardiac rehab. Lamont increased his distance on his post exercise walk test by 28 feet.  Pt plans to continue exercise by working with his personal trainer and plans to join [employer].Harrell Gave RN BSN

## 2020-12-26 ENCOUNTER — Encounter (HOSPITAL_COMMUNITY): Payer: Medicare Other

## 2020-12-28 ENCOUNTER — Encounter (HOSPITAL_COMMUNITY): Payer: Medicare Other

## 2021-08-22 ENCOUNTER — Encounter: Payer: Self-pay | Admitting: Vascular Surgery

## 2021-08-22 ENCOUNTER — Ambulatory Visit (INDEPENDENT_AMBULATORY_CARE_PROVIDER_SITE_OTHER): Payer: Medicare Other | Admitting: Vascular Surgery

## 2021-08-22 ENCOUNTER — Other Ambulatory Visit: Payer: Self-pay

## 2021-08-22 VITALS — BP 128/82 | HR 88 | Temp 98.5°F | Resp 20 | Ht 67.0 in | Wt 232.0 lb

## 2021-08-22 DIAGNOSIS — Z992 Dependence on renal dialysis: Secondary | ICD-10-CM

## 2021-08-22 DIAGNOSIS — N186 End stage renal disease: Secondary | ICD-10-CM

## 2021-08-22 NOTE — H&P (View-Only) (Signed)
? ?ASSESSMENT & PLAN  ? ?ULCERATION OVERLYING LEFT AV FISTULA: This patient has an ulceration overlying his left brachiocephalic fistula.  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  He has had the wound for 3 to 4 weeks but I think we need to proceed with plication of the aneurysm and excision of the ulcer.  He dialyzes on Monday Wednesdays and Fridays so we will plan on proceeding Tuesday as an outpatient.  I have discussed the indications for the procedure and the potential complications and he is agreeable to proceed. ? ?REASON FOR CONSULT:   ? ?Ulceration overlying AV fistula.  The consult is requested by Dr. Corliss Parish. ? ?HPI:  ? ?[name] is a 46 y.o. male who is referred with an ulceration overlying his AV fistula.  I have reviewed the records from the referring office.  This patient has a left brachiocephalic fistula which he says he has used since 2019.  He dialyzes on Monday Wednesdays and Fridays.  He has had multiple interventions on this at [facility] but the fistula has been working well.  He developed an ulceration over the fistula adjacent to an aneurysm about 3 to 4 weeks ago.  He states that this has not changed over the last 3 to 4 weeks.  However given that has not been getting better he was sent for vascular consultation. ? ?Past Medical History:  ?Diagnosis Date  ? CAD (coronary artery disease)   ? CHF (congestive heart failure) ([place])   ? Chronic kidney disease   ? Diabetes mellitus   ? Heart murmur   ? " slight"  ? Hyperlipidemia   ? Hypertension   ? Sleep apnea   ? wears CPAP  ? ? ?Family History  ?Problem Relation Age of Onset  ? Hypertension Mother   ? Heart disease Father   ? Hypertension Father   ? Diabetes Father   ? Hyperlipidemia Father   ? ? ?SOCIAL HISTORY: ?Social History  ? ?Tobacco Use  ? Smoking status: Former  ?  Types: Cigarettes  ?  Quit date: 09/17/2008  ?  Years since quitting: 12.9  ? Smokeless tobacco: Never  ?Substance Use Topics  ? Alcohol use: Not  Currently  ? ? ?No Known Allergies ? ?Current Outpatient Medications  ?Medication Sig Dispense Refill  ? anastrozole (ARIMIDEX) 1 MG tablet Take 1 mg by mouth every other day.    ? aspirin 81 MG EC tablet Take 81 mg by mouth daily.    ? atorvastatin (LIPITOR) 40 MG tablet Take 40 mg by mouth daily.    ? blood glucose meter kit and supplies KIT Dispense based on patient and insurance preference. Use QD-BID home glucose monitoring. (FOR ICD-9 250.00, 250.01). 1 each 0  ? carvedilol (COREG) 12.5 MG tablet Take 12.5 mg by mouth 2 (two) times daily with a meal.    ? ezetimibe (ZETIA) 10 MG tablet Take 10 mg by mouth daily.    ? gabapentin (NEURONTIN) 100 MG capsule Take 100 mg by mouth 3 (three) times daily.    ? glimepiride (AMARYL) 1 MG tablet Take 1 mg by mouth daily.    ? icosapent Ethyl (VASCEPA) 1 g capsule Take 2 g by mouth 2 (two) times daily.    ? nitroGLYCERIN (NITROSTAT) 0.4 MG SL tablet DISSOLVE ONE TABLET UNDER THE TONGUE EVERY 5 MINUTES AS NEEDED FOR CHEST PAIN.  DO NOT EXCEED A TOTAL OF 3 DOSES IN 15 MINUTES (Patient taking differently: Place 0.4 mg  under the tongue every 5 (five) minutes as needed for chest pain.) 25 tablet 3  ? sodium bicarbonate 650 MG tablet Take by mouth.    ? tadalafil (CIALIS) 10 MG tablet Take by mouth.    ? TRULICITY 1.5 NV/9.47YO SOPN Inject 1.5 mg into the skin once a week.    ? VELPHORO 500 MG chewable tablet Chew 1,500 mg by mouth 3 (three) times daily with meals.    ? ?No current facility-administered medications for this visit.  ? ? ?REVIEW OF SYSTEMS:  ?_0  denotes positive finding, _1  denotes negative finding ?Cardiac  Comments:  ?Chest pain or chest pressure:    ?Shortness of breath upon exertion:    ?Short of breath when lying flat:    ?Irregular heart rhythm:    ?    ?Vascular    ?Pain in calf, thigh, or hip brought on by ambulation:    ?Pain in feet at night that wakes you up from your sleep:     ?Blood clot in your veins:    ?Leg swelling:     ?    ?Pulmonary     ?Oxygen at home:    ?Productive cough:     ?Wheezing:     ?    ?Neurologic    ?Sudden weakness in arms or legs:     ?Sudden numbness in arms or legs:     ?Sudden onset of difficulty speaking or slurred speech:    ?Temporary loss of vision in one eye:     ?Problems with dizziness:     ?    ?Gastrointestinal    ?Blood in stool:     ?Vomited blood:     ?    ?Genitourinary    ?Burning when urinating:     ?Blood in urine:    ?    ?Psychiatric    ?Major depression:     ?    ?Hematologic    ?Bleeding problems:    ?Problems with blood clotting too easily:    ?    ?Skin    ?Rashes or ulcers:    ?    ?Constitutional    ?Fever or chills:    ?- ? ?PHYSICAL EXAM:  ? ?Vitals:  ? 08/22/21 1501  ?BP: 128/82  ?Pulse: 88  ?Resp: 20  ?Temp: 98.5 ?F (36.9 ?C)  ?SpO2: 96%  ?Weight: 232 lb (105.2 kg)  ?Height: _2  (1.702 m)  ? ?Body mass index is 36.34 kg/m?. ?GENERAL: The patient is a well-nourished male, in no acute distress. The vital signs are documented above. ?CARDIAC: There is a regular rate and rhythm.  ?VASCULAR: He has a ulceration overlying his left brachiocephalic fistula as documented in the photographs below. ? ? ? ? ?I could not palpate a radial pulse but he has a biphasic radial and ulnar signal with the Doppler and a brisk palmar arch signal. ?PULMONARY: There is good air exchange bilaterally without wheezing or rales. ?MUSCULOSKELETAL: There are no major deformities. ?NEUROLOGIC: No focal weakness or paresthesias are detected. ?PSYCHIATRIC: The patient has a normal affect. ? ?DATA:   ? ?No new data. ? ? ?Deitra Mayo ?[facility] ?

## 2021-08-22 NOTE — Progress Notes (Signed)
? ?ASSESSMENT & PLAN  ? ?ULCERATION OVERLYING LEFT AV FISTULA: This patient has an ulceration overlying his left brachiocephalic fistula.  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  He has had the wound for 3 to 4 weeks but I think we need to proceed with plication of the aneurysm and excision of the ulcer.  He dialyzes on Monday Wednesdays and Fridays so we will plan on proceeding Tuesday as an outpatient.  I have discussed the indications for the procedure and the potential complications and he is agreeable to proceed. ? ?REASON FOR CONSULT:   ? ?Ulceration overlying AV fistula.  The consult is requested by Dr. Kellie Goldsborough. ? ?HPI:  ? ?[name] is a 45 y.o. male who is referred with an ulceration overlying his AV fistula.  I have reviewed the records from the referring office.  This patient has a left brachiocephalic fistula which he says he has used since 2019.  He dialyzes on Monday Wednesdays and Fridays.  He has had multiple interventions on this at [facility] but the fistula has been working well.  He developed an ulceration over the fistula adjacent to an aneurysm about 3 to 4 weeks ago.  He states that this has not changed over the last 3 to 4 weeks.  However given that has not been getting better he was sent for vascular consultation. ? ?Past Medical History:  ?Diagnosis Date  ? CAD (coronary artery disease)   ? CHF (congestive heart failure) ([facility])   ? Chronic kidney disease   ? Diabetes mellitus   ? Heart murmur   ? " slight"  ? Hyperlipidemia   ? Hypertension   ? Sleep apnea   ? wears CPAP  ? ? ?Family History  ?Problem Relation Age of Onset  ? Hypertension Mother   ? Heart disease Father   ? Hypertension Father   ? Diabetes Father   ? Hyperlipidemia Father   ? ? ?SOCIAL HISTORY: ?Social History  ? ?Tobacco Use  ? Smoking status: Former  ?  Types: Cigarettes  ?  Quit date: 09/17/2008  ?  Years since quitting: 12.9  ? Smokeless tobacco: Never  ?Substance Use Topics  ? Alcohol use: Not  Currently  ? ? ?No Known Allergies ? ?Current Outpatient Medications  ?Medication Sig Dispense Refill  ? anastrozole (ARIMIDEX) 1 MG tablet Take 1 mg by mouth every other day.    ? aspirin 81 MG EC tablet Take 81 mg by mouth daily.    ? atorvastatin (LIPITOR) 40 MG tablet Take 40 mg by mouth daily.    ? blood glucose meter kit and supplies KIT Dispense based on patient and insurance preference. Use QD-BID home glucose monitoring. (FOR ICD-9 250.00, 250.01). 1 each 0  ? carvedilol (COREG) 12.5 MG tablet Take 12.5 mg by mouth 2 (two) times daily with a meal.    ? ezetimibe (ZETIA) 10 MG tablet Take 10 mg by mouth daily.    ? gabapentin (NEURONTIN) 100 MG capsule Take 100 mg by mouth 3 (three) times daily.    ? glimepiride (AMARYL) 1 MG tablet Take 1 mg by mouth daily.    ? icosapent Ethyl (VASCEPA) 1 g capsule Take 2 g by mouth 2 (two) times daily.    ? nitroGLYCERIN (NITROSTAT) 0.4 MG SL tablet DISSOLVE ONE TABLET UNDER THE TONGUE EVERY 5 MINUTES AS NEEDED FOR CHEST PAIN.  DO NOT EXCEED A TOTAL OF 3 DOSES IN 15 MINUTES (Patient taking differently: Place 0.4 mg   under the tongue every 5 (five) minutes as needed for chest pain.) 25 tablet 3  ? sodium bicarbonate 650 MG tablet Take by mouth.    ? tadalafil (CIALIS) 10 MG tablet Take by mouth.    ? TRULICITY 1.5 MG/0.5ML SOPN Inject 1.5 mg into the skin once a week.    ? VELPHORO 500 MG chewable tablet Chew 1,500 mg by mouth 3 (three) times daily with meals.    ? ?No current facility-administered medications for this visit.  ? ? ?REVIEW OF SYSTEMS:  ?[X] denotes positive finding, [ ] denotes negative finding ?Cardiac  Comments:  ?Chest pain or chest pressure:    ?Shortness of breath upon exertion:    ?Short of breath when lying flat:    ?Irregular heart rhythm:    ?    ?Vascular    ?Pain in calf, thigh, or hip brought on by ambulation:    ?Pain in feet at night that wakes you up from your sleep:     ?Blood clot in your veins:    ?Leg swelling:     ?    ?Pulmonary     ?Oxygen at home:    ?Productive cough:     ?Wheezing:     ?    ?Neurologic    ?Sudden weakness in arms or legs:     ?Sudden numbness in arms or legs:     ?Sudden onset of difficulty speaking or slurred speech:    ?Temporary loss of vision in one eye:     ?Problems with dizziness:     ?    ?Gastrointestinal    ?Blood in stool:     ?Vomited blood:     ?    ?Genitourinary    ?Burning when urinating:     ?Blood in urine:    ?    ?Psychiatric    ?Major depression:     ?    ?Hematologic    ?Bleeding problems:    ?Problems with blood clotting too easily:    ?    ?Skin    ?Rashes or ulcers:    ?    ?Constitutional    ?Fever or chills:    ?- ? ?PHYSICAL EXAM:  ? ?Vitals:  ? 08/22/21 1501  ?BP: 128/82  ?Pulse: 88  ?Resp: 20  ?Temp: 98.5 ?F (36.9 ?C)  ?SpO2: 96%  ?Weight: 232 lb (105.2 kg)  ?Height: 5' 7" (1.702 m)  ? ?Body mass index is 36.34 kg/m?. ?GENERAL: The patient is a well-nourished male, in no acute distress. The vital signs are documented above. ?CARDIAC: There is a regular rate and rhythm.  ?VASCULAR: He has a ulceration overlying his left brachiocephalic fistula as documented in the photographs below. ? ? ? ? ?I could not palpate a radial pulse but he has a biphasic radial and ulnar signal with the Doppler and a brisk palmar arch signal. ?PULMONARY: There is good air exchange bilaterally without wheezing or rales. ?MUSCULOSKELETAL: There are no major deformities. ?NEUROLOGIC: No focal weakness or paresthesias are detected. ?PSYCHIATRIC: The patient has a normal affect. ? ?DATA:   ? ?No new data. ? ? ?Antoine Fiallos ?[facility] ?

## 2021-08-26 ENCOUNTER — Other Ambulatory Visit: Payer: Self-pay

## 2021-08-26 ENCOUNTER — Encounter (HOSPITAL_COMMUNITY): Payer: Self-pay | Admitting: Vascular Surgery

## 2021-08-26 NOTE — Progress Notes (Signed)
Anesthesia Chart Review: SAME DAY WORK-UP ? Case: [id number] Date/Time: 08/27/21 1148  ? Procedure: PLICATION OF ANEURYSM LEFT ARTERIOVENOUS FISTULA (Left)  ? Anesthesia type: Choice  ? Pre-op diagnosis: ESRD  ? Location: [facility] ROOM 12 / [facility]  ? Surgeons: Angelia Mould, MD  ? ?  ? ? ?DISCUSSION: Patient is a 46 year old male scheduled for the above procedure.  He has an ulceration overlying his left brachiocephalic AV fistula. ?  ?History includes former smoker (quit 09/17/08), DM2, CAD (s/p CABG 12/18/08: LIMA-LAD, SVG-OM1, SVG-PDA; SVG-PDA occluded, s/p overlapping DES PDA/dRCA 12/17/18; maximize medical therapy 09/28/19 cath), murmur, HLD, HTN, ESRD (started hemodialysis 06/2017; HD MWF), OSA (CPAP). He is undergoing an evaluation at [facility] for consideration of future renal transplant.  ? ?He had an echo on 08/15/21 as part of his ongoing transplant evaluation and showed newly depressed LVEF to ~ 30% with new wall motion abnormalities with basal inferior and mid inferolateral akinesis and basal inferolateral hypokinesis. He is not scheduled to see his primary [facility] cardiologist until 04/15/21 (per [place]), but since testing was done at [facility], it appears he has a cardiology visit on 09/26/21 with Rhetta Mura, NP. ? ?Patient has an ulceration over his AVF. I don't see that he has had any bleeding yet. Anesthesiologist Dr. Hoy Morn and I discussed with Dr. Scot Dock.  He would be reluctant to push surgery out a month given potential for bleeding risk. He will contact patient to assess for any changes with her ulceration, but currently anticipated plan is to proceed with case under local anesthesia; however, definitive plan will be pending vascular surgery and anesthesia team evaluations.  ? ? ?VS:  ?BP Readings from Last 3 Encounters:  ?08/22/21 128/82  ?11/01/20 (!) 148/76  ?10/13/17 103/72  ? ?Pulse Readings from Last 3 Encounters:  ?08/22/21 88  ?11/01/20 94  ?10/13/17 89   ? ? ? ?PROVIDERS: ?Nicola Girt, DO is PCP  ?Barbaraann Boys, MD is cardiologist ([facility]). Previously was seeing Dr. Conrad Pope at [facility] and Dr. Lyman Bishop [facility], last 01/10/15). Appears next visit with Dr. Ferdinand Lango is scheduled for 04/15/21; however, he also has a [place] cardiology visit scheduled for 09/26/21 with Rhetta Mura, NP following new LV dysfunction and wall motion abnormality on May echo.  ?- Jacolyn Reedy, FNP is endocrinology provider ?- Nephrologist is with [facility] ?- Neurologist is Dr. Asencion Partridge Dohmeier. Last seen 03/18/17 for OSA follow-up. ?  ? ?LABS: For labs on arrival. A1c 6.7% on 05/21/21 ([facility]). HGB 11.1 08/21/21 ([facility]) ? ? ?IMAGES: ?CT Abd/pelvis 08/15/21 ([facility]): ?Impression:  ?1.  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  ?2.  No CT evidence of metastasis in the abdomen and pelvis.  ?3.  Mild atherosclerotic disease of the abdominal aorta and bilateral  ?common and external iliac arteries.   ? ? ?EKG: ?EKG 05/21/21 ([facility]): Per Result Narrative: ?Normal sinus rhythm  ?Left atrial enlargement  ?Late precordial R/S transition  ?Left ventricular hypertrophy with repolarization abnormalities  ?Anterior ST elevation, probably due to LVH  ?Prolonged QT  ?artifact present in V6  ?Abnormal ECG  ? ?When compared with ECG of 17-May-2020 10:45,  ?Prolonged QT is less prominent  ?I reviewed and concur with this report. Electronically signed PT:[email or web address], MD, NEIL ([id number]) on 05/21/2021 9:58:50 PM ? ? ?CV: ?Echo 08/15/21 ([place]): ?- SEVERE LV DYSFUNCTION WITH MODERATE LVH. Closest EF 30%. Calculated EF  28%.  ?- ELEVATED LA PRESSURES WITH DIASTOLIC DYSFUNCTION  ?-  NORMAL RIGHT VENTRICULAR SYSTOLIC FUNCTION  ?-  VALVULAR REGURGITATION: TRIVIAL PR, TRIVIAL TR  ?-  NO VALVULAR STENOSIS  ?- LVEF 30-35%. BASAL INFERIOR AKINESIS. BASAL INFEROLATERAL HYPOKINESIS AND MID INFEROLATERAL AKINESIS.  ?- STRESS ECHO CANCELLED DUE  TO NEW WALL MOTION ABNORMALITIES AS COMPARED TO  ?- PRIOR HISTORY OF NORMAL EF.  ?- NO PRIOR STUDY FOR COMPARISON  ?- (Echo 07/08/17: mild LVH, LVEF 55-60%, normal wall motion, grade 2 DD) ? ?  ?Cardiac cath 09/28/19 ([facility]): ?Coronary Findings Diagnostic Dominance: Right  ?- Left Anterior Descending: Prox LAD lesion is 70% stenosed. Mid LAD lesion is 100% stenosed.  ?- First Obtuse Marginal Branch: 1st Mrg lesion is 99% stenosed. Second Obtuse Marginal Branch: 2nd Mrg lesion is 70% stenosed.  ?- Right Coronary Artery: Prox RCA lesion is 40% stenosed. Previously placed Dist RCA drug eluting stent is widely patent. Not the culprit lesion. Lesion length: 18 mm. The lesion is not complex (non high-C) and located proximal to the major branch. Stent delivery was done by way of balloon expansion. Right Posterior Descending Artery: Previously placed RPDA drug eluting stent is widely patent. Culprit lesion. Lesion length: 14 mm. The lesion is not complex (non high-C) and located at the major branch. The lesion was previously treated using angioplasty. Stent delivery was done by way of balloon expansion. Angioplasty was performed using a standard balloon.  ?- Free LIMA Graft To Mid LAD: The graft exhibits minimal luminal irregularities.  ?Vein Graft To 1st Mrg: There is severe diffuse disease throughout the graft. 70 prox and distal lesions Origin to Prox Graft lesion is 70% stenosed. Dist Graft lesion is 70% stenosed.  ? ?Narrative: ?? Multi-vessel coronary artery disease.  ?? Maximize medical therapy.  ?? Discussed with family:  ?? Discussed with patient (but may still be drowsy).  ? ?LCGP for positive stress test in the anterior wall as part of renal  ?transplant workup.  ?Right femoral access.  ?LMCA patent.  ?100% occluded LAD but LIMA to LAD patent.  ?100% occluded OM1 but patent SVG to OM1.  (Note this vessel has multiple  ?70% lesions that are relatively unchanged from previous cath).  ?70% OM2 (also unchanged from  previous cath.   ?Mid RCA lesion 40%.  ?Distal RCA and PDA stents are patent.  ? ?Considering patient has stable CAD, is asymptomatic, the stress test was  ?in the LAD territory (which is served by a patent LIMA) elected not to  ?perform PCI of the OM1 SVG or the native OM2.  ? ? ?US Carotid 08/25/19: Not viewable in [place]. ? ? ?Past Medical History:  ?Diagnosis Date  ? CAD (coronary artery disease)   ? CHF (congestive heart failure) ([place])   ? Chronic kidney disease   ? Diabetes mellitus   ? Heart murmur   ? " slight"  ? Hyperlipidemia   ? Hypertension   ? Sleep apnea   ? wears CPAP  ? ? ?Past Surgical History:  ?Procedure Laterality Date  ? AV FISTULA PLACEMENT Left 07/23/2017  ? Procedure: ARTERIOVENOUS (AV) FISTULA CREATION LEFT ARM;  Surgeon: Serafina Mitchell, MD;  Location: [facility];  Service: Vascular;  Laterality: Left;  ? CORONARY ANGIOPLASTY  2010  ? LIMA to LAD, SVG to OM1,SVG to PDA  ? CORONARY ARTERY BYPASS GRAFT    ? EYE SURGERY    ? for Diabetic Retinopathy  ? INSERTION OF DIALYSIS CATHETER N/A  07/07/2017  ? Procedure: INSERTION OF DIALYSIS CATHETER;  Surgeon: Conrad , MD;  Location: [facility];  Service: Vascular;  Laterality: N/A;  ? ? ?MEDICATIONS: ?No current facility-administered medications for this encounter.  ? ? anastrozole (ARIMIDEX) 1 MG tablet  ? aspirin 81 MG EC tablet  ? atorvastatin (LIPITOR) 40 MG tablet  ? carvedilol (COREG) 12.5 MG tablet  ? cholecalciferol (VITAMIN D) 25 MCG (1000 UNIT) tablet  ? Cinacalcet HCl (SENSIPAR PO)  ? clomiPHENE (CLOMID) 50 MG tablet  ? ezetimibe (ZETIA) 10 MG tablet  ? gabapentin (NEURONTIN) 100 MG capsule  ? glimepiride (AMARYL) 1 MG tablet  ? icosapent Ethyl (VASCEPA) 1 g capsule  ? lidocaine-prilocaine (EMLA) cream  ? nitroGLYCERIN (NITROSTAT) 0.4 MG SL tablet  ? tadalafil (CIALIS) 10 MG tablet  ? TRULICITY 1.5 CZ/4.4HQ SOPN  ? VELPHORO 500 MG chewable tablet  ? blood glucose meter kit and supplies KIT  ? ? ?Myra Gianotti, PA-C ?Surgical  Short Stay/Anesthesiology ?[facility] Phone [phone] ?[facility] Phone [phone] ?08/26/2021 4:49 PM ? ? ? ? ? ? ?

## 2021-08-26 NOTE — Progress Notes (Signed)
PCP - Stevie Kern, MD ?Cardiologist - Barbaraann Boys, Zenia Resides, MD ? ?PPM/ICD - denies ?Device Orders - n/a ?Rep Notified - n/a ? ?Chest x-ray - n/a ?EKG - 08/27/2021 ?Stress Test - denies ?ECHO - 08/15/2021 ?Cardiac Cath - 09/28/2019 ? ?CPAP - yes ? ?Fasting Blood Sugar - patient is checking CBG 1-2/week ? ?Blood Thinner Instructions: n/a ?Aspirin Instructions: no holding ?Patient was instructed: As of today, STOP taking any Aspirin (unless otherwise instructed by your surgeon) Aleve, Naproxen, Ibuprofen, Motrin, Advil, Goody's, BC's, all herbal medications, fish oil, and all vitamins. ? ?ERAS Protcol - n/a ? ?COVID TEST- n/a ? ?Anesthesia review: yes - cardiac history ? ?Patient verbally denies any shortness of breath, fever, cough and chest pain during phone call ? ? ?-------------  SDW INSTRUCTIONS given: ? ?Your procedure is scheduled on Tuesday, may 16th, 2023. ? Report to [facility] Main Entrance "A" at 09:30 A.M., and check in at the Admitting office. ? Call this number if you have problems the morning of surgery: ? [phone] ? ? Remember: ? Do not eat or drink after midnight the night before your surgery ?  ? Take these medicines the morning of surgery with A SIP OF WATER Anastrozole, ASA, Lipitor, Coreg, Zetia, Clomid; PRN: Gabapentin, Nitroglycerin. ? ?Do not take glimepiride (AMARYL) and TRULICITY the morning of surgery. ? ?How do I manage my blood sugar before surgery? ?Check your blood sugar at least 4 times a day, starting 2 days before surgery, to make sure that the level is not too high or low. ? ?Check your blood sugar the morning of your surgery when you wake up and every 2 hours until you get to the Short Stay unit. ? ?If your blood sugar is less than 70 mg/dL, you will need to treat for low blood sugar: ?Do not take insulin. ?Treat a low blood sugar (less than 70 mg/dL) with ? cup of clear juice (cranberry or apple), 4 glucose tablets, OR glucose gel. ?Recheck blood sugar in 15 minutes after  treatment (to make sure it is greater than 70 mg/dL). If your blood sugar is not greater than 70 mg/dL on recheck, call [phone] for further instructions. ?Report your blood sugar to the short stay nurse when you get to Short Stay. ? ? The day of surgery: ?        ?           Do not wear jewelry ?           Do not wear lotions, powders, colognes, or deodorant. ?           Men may shave face and neck. ?           Do not bring valuables to the hospital. ?           [facility] is not responsible for any belongings or valuables. ? ?Do NOT Smoke (Tobacco/Vaping) 24 hours prior to your procedure ?If you use a CPAP at night, you may bring all equipment for your overnight stay. ?  ?Contacts, glasses, dentures or bridgework may not be worn into surgery.    ?  ?Patients discharged the day of surgery will not be allowed to drive home, and someone needs to stay with them for 24 hours. ? ?Special instructions:   ?[facility]- Preparing For Surgery ? ?Before surgery, you can play an important role. Because skin is not sterile, your skin needs to be as free of germs as possible. You can reduce the  number of germs on your skin by washing with CHG (chlorahexidine gluconate) Soap before surgery.  CHG is an antiseptic cleaner which kills germs and bonds with the skin to continue killing germs even after washing.   ? ?Oral Hygiene is also important to reduce your risk of infection.  Remember - BRUSH YOUR TEETH THE MORNING OF SURGERY WITH YOUR REGULAR TOOTHPASTE ? ?Please do not use if you have an allergy to CHG or antibacterial soaps. If your skin becomes reddened/irritated stop using the CHG.  ?Do not shave (including legs and underarms) for at least 48 hours prior to first CHG shower. It is OK to shave your face. ? ?Please follow these instructions carefully. ?  ?Shower the NIGHT BEFORE SURGERY and the MORNING OF SURGERY with DIAL Soap.  ? ?Pat yourself dry with a CLEAN TOWEL. ? ?Wear CLEAN PAJAMAS to bed the night before  surgery ? ?Place CLEAN SHEETS on your bed the night of your first shower and DO NOT SLEEP WITH PETS. ? ? ?Day of Surgery: ?Please shower morning of surgery  ?Wear Clean/Comfortable clothing the morning of surgery ?Do not apply any deodorants/lotions.   ?Remember to brush your teeth WITH YOUR REGULAR TOOTHPASTE. ?  ?Questions were answered. Patient verbalized understanding of instructions.  ? ? ?    ?

## 2021-08-26 NOTE — Anesthesia Preprocedure Evaluation (Addendum)
Anesthesia Evaluation  ?Patient identified by MRN, date of birth, ID band ?Patient awake ? ? ? ?Reviewed: ?Allergy & Precautions, NPO status , Patient's Chart, lab work & pertinent test results ? ?History of Anesthesia Complications ?Negative for: history of anesthetic complications ? ?Airway ?Mallampati: IV ? ?TM Distance: >3 FB ?Neck ROM: Full ? ? ? Dental ? ?(+) Dental Advisory Given ?  ?Pulmonary ?sleep apnea and Continuous Positive Airway Pressure Ventilation , former smoker,  ?  ?breath sounds clear to auscultation ? ? ? ? ? ? Cardiovascular ?hypertension, Pt. on medications and Pt. on home beta blockers ?(-) angina+ CAD and + CABG  ? ?Rhythm:Regular Rate:Normal ? ?08/15/2021 ECHO: EF 30%, severe LV dysfunction, mod LVH, normal RVF, inferior/inferolateral akinesis, no significant valvular abnormalities ?  ?Neuro/Psych ?negative neurological ROS ?   ? GI/Hepatic ?negative GI ROS, Neg liver ROS,   ?Endo/Other  ?diabetes (glu 132), Oral Hypoglycemic Agents ? Renal/GU ?ESRF and DialysisRenal disease (K+ 5.0)  ? ?  ?Musculoskeletal ? ? Abdominal ?(+) + obese,   ?Peds ? Hematology ?negative hematology ROS ?(+)   ?Anesthesia Other Findings ? ? Reproductive/Obstetrics ? ?  ? ? ? ? ? ? ? ? ? ? ? ? ? ?  ?  ? ? ? ? ? ? ? ?Anesthesia Physical ?Anesthesia Plan ? ?ASA: 4 ? ?Anesthesia Plan: MAC  ? ?Post-op Pain Management: Tylenol PO (pre-op)*  ? ?Induction:  ? ?PONV Risk Score and Plan: 1 and Ondansetron and Treatment may vary due to age or medical condition ? ?Airway Management Planned: Natural Airway and Simple Face Mask ? ?Additional Equipment: None ? ?Intra-op Plan:  ? ?Post-operative Plan:  ? ?Informed Consent: I have reviewed the patients History and Physical, chart, labs and discussed the procedure including the risks, benefits and alternatives for the proposed anesthesia with the patient or authorized representative who has indicated his/her understanding and acceptance.   ? ? ? ?Dental advisory given ? ?Plan Discussed with: CRNA and Surgeon ? ?Anesthesia Plan Comments: (See PAT note written 08/26/2021 by Myra Gianotti, PA-C regarding new findings on 08/16/19 [facility] echo. Dr. Scot Dock felt case could be done under local only if needed.  ?)  ? ? ? ? ? ?Anesthesia Quick Evaluation ? ?

## 2021-08-27 ENCOUNTER — Encounter (HOSPITAL_COMMUNITY)
Admission: RE | Disposition: A | Discharge status: Home / Self Care | Payer: Self-pay | Source: Home / Self Care | Attending: Vascular Surgery

## 2021-08-27 ENCOUNTER — Encounter (HOSPITAL_COMMUNITY): Payer: Self-pay | Admitting: Vascular Surgery

## 2021-08-27 ENCOUNTER — Ambulatory Visit (HOSPITAL_COMMUNITY)
Admission: RE | Admit: 2021-08-27 | Discharge: 2021-08-27 | Disposition: A | Discharge status: Home / Self Care | Payer: Medicare Other | Attending: Vascular Surgery | Admitting: Vascular Surgery

## 2021-08-27 ENCOUNTER — Ambulatory Visit (HOSPITAL_COMMUNITY): Payer: Medicare Other | Admitting: Vascular Surgery

## 2021-08-27 ENCOUNTER — Other Ambulatory Visit: Payer: Self-pay

## 2021-08-27 DIAGNOSIS — T82590A Other mechanical complication of surgically created arteriovenous fistula, initial encounter: Secondary | ICD-10-CM | POA: Diagnosis present

## 2021-08-27 DIAGNOSIS — E1122 Type 2 diabetes mellitus with diabetic chronic kidney disease: Secondary | ICD-10-CM | POA: Diagnosis not present

## 2021-08-27 DIAGNOSIS — G4730 Sleep apnea, unspecified: Secondary | ICD-10-CM | POA: Diagnosis not present

## 2021-08-27 DIAGNOSIS — Z87891 Personal history of nicotine dependence: Secondary | ICD-10-CM | POA: Insufficient documentation

## 2021-08-27 DIAGNOSIS — E785 Hyperlipidemia, unspecified: Secondary | ICD-10-CM | POA: Insufficient documentation

## 2021-08-27 DIAGNOSIS — T82898A Other specified complication of vascular prosthetic devices, implants and grafts, initial encounter: Secondary | ICD-10-CM

## 2021-08-27 DIAGNOSIS — Z992 Dependence on renal dialysis: Secondary | ICD-10-CM | POA: Diagnosis not present

## 2021-08-27 DIAGNOSIS — I2510 Atherosclerotic heart disease of native coronary artery without angina pectoris: Secondary | ICD-10-CM | POA: Insufficient documentation

## 2021-08-27 DIAGNOSIS — E669 Obesity, unspecified: Secondary | ICD-10-CM | POA: Insufficient documentation

## 2021-08-27 DIAGNOSIS — Z7984 Long term (current) use of oral hypoglycemic drugs: Secondary | ICD-10-CM | POA: Insufficient documentation

## 2021-08-27 DIAGNOSIS — I120 Hypertensive chronic kidney disease with stage 5 chronic kidney disease or end stage renal disease: Secondary | ICD-10-CM | POA: Insufficient documentation

## 2021-08-27 DIAGNOSIS — Z6836 Body mass index (BMI) 36.0-36.9, adult: Secondary | ICD-10-CM | POA: Insufficient documentation

## 2021-08-27 DIAGNOSIS — N186 End stage renal disease: Secondary | ICD-10-CM | POA: Insufficient documentation

## 2021-08-27 DIAGNOSIS — Z951 Presence of aortocoronary bypass graft: Secondary | ICD-10-CM | POA: Insufficient documentation

## 2021-08-27 DIAGNOSIS — N185 Chronic kidney disease, stage 5: Secondary | ICD-10-CM

## 2021-08-27 DIAGNOSIS — Y832 Surgical operation with anastomosis, bypass or graft as the cause of abnormal reaction of the patient, or of later complication, without mention of misadventure at the time of the procedure: Secondary | ICD-10-CM | POA: Diagnosis not present

## 2021-08-27 HISTORY — PX: FISTULA SUPERFICIALIZATION: SHX6341

## 2021-08-27 LAB — POCT I-STAT, CHEM 8
BUN: 41 mg/dL — ABNORMAL HIGH (ref 6–20)
Calcium, Ion: 0.91 mmol/L — ABNORMAL LOW (ref 1.15–1.40)
Chloride: 99 mmol/L (ref 98–111)
Creatinine, Ser: 12.1 mg/dL — ABNORMAL HIGH (ref 0.61–1.24)
Glucose, Bld: 148 mg/dL — ABNORMAL HIGH (ref 70–99)
HCT: 34 % — ABNORMAL LOW (ref 39.0–52.0)
Hemoglobin: 11.6 g/dL — ABNORMAL LOW (ref 13.0–17.0)
Potassium: 5 mmol/L (ref 3.5–5.1)
Sodium: 140 mmol/L (ref 135–145)
TCO2: 31 mmol/L (ref 22–32)

## 2021-08-27 LAB — GLUCOSE, CAPILLARY
Glucose-Capillary: 132 mg/dL — ABNORMAL HIGH (ref 70–99)
Glucose-Capillary: 100 mg/dL — ABNORMAL HIGH (ref 70–99)
Glucose-Capillary: 118 mg/dL — ABNORMAL HIGH (ref 70–99)

## 2021-08-27 SURGERY — FISTULA SUPERFICIALIZATION
Anesthesia: Monitor Anesthesia Care | Site: Arm Upper | Laterality: Left

## 2021-08-27 MED ORDER — OXYCODONE HCL 5 MG PO TABS: 5.0000 mg | ORAL_TABLET | Freq: Once | ORAL | Status: DC | PRN

## 2021-08-27 MED ORDER — CEFAZOLIN SODIUM-DEXTROSE 2-4 GM/100ML-% IV SOLN
2.0000 g | INTRAVENOUS | Status: AC
  Administered 2021-08-27: 2 g via INTRAVENOUS

## 2021-08-27 MED ORDER — ACETAMINOPHEN 500 MG PO TABS
ORAL_TABLET | ORAL | Status: AC
  Administered 2021-08-27: 1000 mg
  Filled 2021-08-27: qty 2

## 2021-08-27 MED ORDER — HEPARIN 6000 UNIT IRRIGATION SOLUTION
Status: DC | PRN
  Administered 2021-08-27: 1

## 2021-08-27 MED ORDER — CEFAZOLIN SODIUM-DEXTROSE 2-4 GM/100ML-% IV SOLN
INTRAVENOUS | Status: AC
  Filled 2021-08-27: qty 100

## 2021-08-27 MED ORDER — FENTANYL CITRATE (PF) 250 MCG/5ML IJ SOLN
INTRAMUSCULAR | Status: AC
  Filled 2021-08-27: qty 5

## 2021-08-27 MED ORDER — MIDAZOLAM HCL 2 MG/2ML IJ SOLN: 0.5000 mg | Freq: Once | INTRAMUSCULAR | Status: DC | PRN

## 2021-08-27 MED ORDER — HYDROCODONE-ACETAMINOPHEN 5-325 MG PO TABS: 1.0000 | ORAL_TABLET | ORAL | 0 refills | Status: DC | PRN

## 2021-08-27 MED ORDER — CHLORHEXIDINE GLUCONATE 0.12 % MT SOLN
OROMUCOSAL | Status: AC
  Administered 2021-08-27: 15 mL
  Filled 2021-08-27: qty 15

## 2021-08-27 MED ORDER — CHLORHEXIDINE GLUCONATE 4 % EX LIQD: 60.0000 mL | Freq: Once | CUTANEOUS | Status: DC

## 2021-08-27 MED ORDER — OXYCODONE HCL 5 MG/5ML PO SOLN: 5.0000 mg | Freq: Once | ORAL | Status: DC | PRN

## 2021-08-27 MED ORDER — FENTANYL CITRATE (PF) 250 MCG/5ML IJ SOLN
INTRAMUSCULAR | Status: DC | PRN
  Administered 2021-08-27 (×2): 25 ug via INTRAVENOUS

## 2021-08-27 MED ORDER — SODIUM CHLORIDE 0.9 % IV SOLN: INTRAVENOUS | Status: DC

## 2021-08-27 MED ORDER — PAPAVERINE HCL 30 MG/ML IJ SOLN
INTRAMUSCULAR | Status: AC
  Filled 2021-08-27: qty 2

## 2021-08-27 MED ORDER — LIDOCAINE-EPINEPHRINE (PF) 1 %-1:200000 IJ SOLN
INTRAMUSCULAR | Status: DC | PRN
  Administered 2021-08-27: 8 mL

## 2021-08-27 MED ORDER — FENTANYL CITRATE (PF) 100 MCG/2ML IJ SOLN: 25.0000 ug | INTRAMUSCULAR | Status: DC | PRN

## 2021-08-27 MED ORDER — LIDOCAINE-EPINEPHRINE (PF) 1 %-1:200000 IJ SOLN
INTRAMUSCULAR | Status: AC
  Filled 2021-08-27: qty 60

## 2021-08-27 MED ORDER — INSULIN ASPART 100 UNIT/ML IJ SOLN: 0.0000 [IU] | INTRAMUSCULAR | Status: DC | PRN

## 2021-08-27 MED ORDER — MIDAZOLAM HCL 2 MG/2ML IJ SOLN
INTRAMUSCULAR | Status: AC
  Filled 2021-08-27: qty 2

## 2021-08-27 MED ORDER — MIDAZOLAM HCL 2 MG/2ML IJ SOLN
INTRAMUSCULAR | Status: DC | PRN
  Administered 2021-08-27 (×2): .5 mg via INTRAVENOUS

## 2021-08-27 MED ORDER — 0.9 % SODIUM CHLORIDE (POUR BTL) OPTIME
TOPICAL | Status: DC | PRN
Start: 2021-08-27 — End: 2021-08-27
  Administered 2021-08-27: 1000 mL

## 2021-08-27 MED ORDER — HEPARIN 6000 UNIT IRRIGATION SOLUTION
Status: AC
  Filled 2021-08-27: qty 500

## 2021-08-27 SURGICAL SUPPLY — 35 items
ADH SKN CLS APL DERMABOND .7 (GAUZE/BANDAGES/DRESSINGS) ×1
ARMBAND PINK RESTRICT EXTREMIT (MISCELLANEOUS) ×2 IMPLANT
BAG COUNTER SPONGE SURGICOUNT (BAG) ×2 IMPLANT
BAG SPNG CNTER NS LX DISP (BAG) ×1
CANISTER SUCT 3000ML PPV (MISCELLANEOUS) ×2 IMPLANT
CANNULA VESSEL 3MM 2 BLNT TIP (CANNULA) ×2 IMPLANT
CLIP VESOCCLUDE MED 6/CT (CLIP) ×2 IMPLANT
CLIP VESOCCLUDE SM WIDE 6/CT (CLIP) ×2 IMPLANT
COVER PROBE W GEL 5X96 (DRAPES) ×2 IMPLANT
DECANTER SPIKE VIAL GLASS SM (MISCELLANEOUS) ×2 IMPLANT
DERMABOND ADVANCED (GAUZE/BANDAGES/DRESSINGS) ×1
DERMABOND ADVANCED .7 DNX12 (GAUZE/BANDAGES/DRESSINGS) ×1 IMPLANT
DRAPE HALF SHEET 40X57 (DRAPES) ×1 IMPLANT
ELECT REM PT RETURN 9FT ADLT (ELECTROSURGICAL) ×2
ELECTRODE REM PT RTRN 9FT ADLT (ELECTROSURGICAL) ×1 IMPLANT
GLOVE BIO SURGEON STRL SZ7.5 (GLOVE) ×2 IMPLANT
GLOVE BIOGEL PI IND STRL 8 (GLOVE) ×1 IMPLANT
GLOVE BIOGEL PI IND STRL 9 (GLOVE) IMPLANT
GLOVE BIOGEL PI INDICATOR 8 (GLOVE) ×1
GLOVE BIOGEL PI INDICATOR 9 (GLOVE) ×1
GOWN STRL REUS W/ TWL LRG LVL3 (GOWN DISPOSABLE) ×3 IMPLANT
GOWN STRL REUS W/TWL LRG LVL3 (GOWN DISPOSABLE) ×8
KIT BASIN OR (CUSTOM PROCEDURE TRAY) ×2 IMPLANT
KIT TURNOVER KIT B (KITS) ×2 IMPLANT
NS IRRIG 1000ML POUR BTL (IV SOLUTION) ×2 IMPLANT
PACK CV ACCESS (CUSTOM PROCEDURE TRAY) ×2 IMPLANT
PAD ARMBOARD 7.5X6 YLW CONV (MISCELLANEOUS) ×4 IMPLANT
SLING ARM FOAM STRAP LRG (SOFTGOODS) ×1 IMPLANT
SUT MNCRL AB 4-0 PS2 18 (SUTURE) ×2 IMPLANT
SUT PROLENE 6 0 BV (SUTURE) ×2 IMPLANT
SUT VIC AB 3-0 SH 27 (SUTURE) ×2
SUT VIC AB 3-0 SH 27X BRD (SUTURE) ×1 IMPLANT
TOWEL GREEN STERILE (TOWEL DISPOSABLE) ×2 IMPLANT
UNDERPAD 30X36 HEAVY ABSORB (UNDERPADS AND DIAPERS) ×2 IMPLANT
WATER STERILE IRR 1000ML POUR (IV SOLUTION) ×2 IMPLANT

## 2021-08-27 NOTE — Transfer of Care (Signed)
Immediate Anesthesia Transfer of Care Note ? ?Patient: [name] ? 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 ?Patient Location: PACU ? ?Anesthesia Type:MAC ? ?Level of Consciousness: awake, alert  and oriented ? ?Airway & Oxygen Therapy: Patient Spontanous Breathing ? ?Post-op Assessment: Report given to RN, Post -op Vital signs reviewed and stable, Patient moving all extremities X 4 and Patient able to stick tongue midline ? ?Post vital signs: Reviewed ? ?Last Vitals:  ?Vitals Value Taken Time  ?BP 127/83 08/27/21 1330  ?Temp 98.2   ?Pulse 69 08/27/21 1331  ?Resp 12 08/27/21 1331  ?SpO2 93 % 08/27/21 1331  ?Vitals shown include unvalidated device data. ? ?Last Pain:  ?Vitals:  ? 08/27/21 1013  ?TempSrc:   ?PainSc: 0-No pain  ?   ? ?  ? ?Complications: No notable events documented. ?

## 2021-08-27 NOTE — Interval H&P Note (Signed)
History and Physical Interval Note: ? ?08/27/2021 ?12:22 PM ? ?[name]  has presented today for surgery, with the diagnosis of ESRD.  The various methods of treatment have been discussed with the patient and family. After consideration of risks, benefits and other options for treatment, the patient has consented to  Procedure(s): ?PLICATION OF ANEURYSM LEFT ARTERIOVENOUS FISTULA (Left) as a surgical intervention.  The patient's history has been reviewed, patient examined, no change in status, stable for surgery.  I have reviewed the patient's chart and labs.  Questions were answered to the patient's satisfaction.   ? ? ?Deitra Mayo ? ? ?

## 2021-08-27 NOTE — Anesthesia Postprocedure Evaluation (Signed)
Anesthesia Post Note ? ?Patient: ALBERTA CAIRNS ? 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 ?  ? ?Patient location during evaluation: PACU ?Anesthesia Type: MAC ?Level of consciousness: awake and alert, patient cooperative and oriented ?Pain management: pain level controlled ?Vital Signs Assessment: post-procedure vital signs reviewed and stable ?Respiratory status: nonlabored ventilation, spontaneous breathing and respiratory function stable ?Cardiovascular status: blood pressure returned to baseline and stable ?Postop Assessment: no apparent nausea or vomiting, able to ambulate and adequate PO intake ?Anesthetic complications: no ? ? ?No notable events documented. ? ?Last Vitals:  ?Vitals:  ? 08/27/21 1330 08/27/21 1345  ?BP: 127/83 118/71  ?Pulse: 70 67  ?Resp: 12 11  ?Temp: 36.7 ?C 36.8 ?C  ?SpO2: 92% 94%  ?  ?Last Pain:  ?Vitals:  ? 08/27/21 1345  ?TempSrc:   ?PainSc: 0-No pain  ? ? ?  ?  ?  ?  ?  ?  ? ?Eron Goble,E. Darcie Mellone ? ? ? ? ?

## 2021-08-27 NOTE — Discharge Instructions (Signed)
° °  [facility] ° °Discharge Instructions ° °AV Fistula or Graft Surgery for Dialysis Access ° °Please refer to the following instructions for your post-procedure care. Your surgeon or physician assistant will discuss any changes with you. ° °Activity ° °You may drive the day following your surgery, if you are comfortable and no longer taking prescription pain medication. Resume full activity as the soreness in your incision resolves. ° °Bathing/Showering ° °You may shower after you go home. Keep your incision dry for 48 hours. Do not soak in a bathtub, hot tub, or swim until the incision heals completely. You may not shower if you have a hemodialysis catheter. ° °Incision Care ° °Clean your incision with mild soap and water after 48 hours. Pat the area dry with a clean towel. You do not need a bandage unless otherwise instructed. Do not apply any ointments or creams to your incision. You may have skin glue on your incision. Do not peel it off. It will come off on its own in about one week. Your arm may swell a bit after surgery. To reduce swelling use pillows to elevate your arm so it is above your heart. Your doctor will tell you if you need to lightly wrap your arm with an ACE bandage. ° °Diet ° °Resume your normal diet. There are not special food restrictions following this procedure. In order to heal from your surgery, it is CRITICAL to get adequate nutrition. Your body requires vitamins, minerals, and protein. Vegetables are the best source of vitamins and minerals. Vegetables also provide the perfect balance of protein. Processed food has little nutritional value, so try to avoid this. ° °Medications ° °Resume taking all of your medications. If your incision is causing pain, you may take over-the counter pain relievers such as acetaminophen (Tylenol). If you were prescribed a stronger pain medication, please be aware these medications can cause nausea and constipation. Prevent  nausea by taking the medication with a snack or meal. Avoid constipation by drinking plenty of fluids and eating foods with high amount of fiber, such as fruits, vegetables, and grains. Do not take Tylenol if you are taking prescription pain medications. ° ° ° ° °Follow up °Your surgeon may want to see you in the office following your access surgery. If so, this will be arranged at the time of your surgery. ° °Please call us immediately for any of the following conditions: ° °Increased pain, redness, drainage (pus) from your incision site °Fever of 101 degrees or higher °Severe or worsening pain at your incision site °Hand pain or numbness. ° °Reduce your risk of vascular disease: ° °Stop smoking. If you would like help, call [facility] at 1-800-QUIT-NOW (1-800-784-8669) or [place] at 336-586-4000 ° °Manage your cholesterol °Maintain a desired weight °Control your diabetes °Keep your blood pressure down ° °Dialysis ° °It will take several weeks to several months for your new dialysis access to be ready for use. Your surgeon will determine when it is OK to use it. Your nephrologist will continue to direct your dialysis. You can continue to use your Permcath until your new access is ready for use. ° °If you have any questions, please call the office at [phone]. ° °

## 2021-08-27 NOTE — Op Note (Signed)
? ? ?  NAME: [name]    MRN: [id number] ?DOB: 19-Feb-1976    DATE OF OPERATION: 08/27/2021 ? ?PREOP DIAGNOSIS:   ? ?END-STAGE RENAL DISEASE ? ?POSTOP DIAGNOSIS:   ? ?Same ? ?PROCEDURE:  ?  ?Excision of eschar overlying left AV fistula ? ?SURGEON: Judeth Cornfield. Scot Dock, MD ? ?ASSIST: Arlee Muslim PA ? ?ANESTHESIA: Local ? ?EBL: Minimal ? ?INDICATIONS:  ? ? [name] is a 46 y.o. male who was seen in the office with an eschar overlying his left arm fistula.  He has a significant cardiac history and a recent echo showed an ejection fraction of 30%.  He is being evaluated by cardiology and being considered for renal transplantation.  Given his cardiac history I discussed this with anesthesia and we elected to do this under essentially straight local. ? ?FINDINGS:  ? ?The eschar did not connect with the fistula. ? ?TECHNIQUE:  ? ?The patient was taken to the operating room and the left arm was prepped and draped in usual sterile fashion.  I marked and incision that was 3 times the length compared to the width.  The skin was anesthetized.  Using a 15 blade I excised this ellipse of skin overlying the fistula.  I then dissected the skin away from the fistula and there was no connection.  I then undermined the tissue in both directions so that I could close this in 2 layers.  The deep layer was closed with 3-0 Vicryl.  The skin was closed with 4-0 Monocryl.  Dermabond was applied.  Patient tolerated the procedure well was transferred recovery room in stable condition. ? ?Given the complexity of the case a first assistant was necessary in order to expedient the procedure and safely perform the technical aspects of the operation. ? ?Deitra Mayo, MD, FACS ?[facility] ? ?DATE OF DICTATION:   08/27/2021 ? ?

## 2021-08-28 ENCOUNTER — Encounter (HOSPITAL_COMMUNITY): Payer: Self-pay | Admitting: Vascular Surgery

## 2021-09-06 ENCOUNTER — Encounter: Payer: Self-pay | Admitting: Vascular Surgery

## 2021-10-08 HISTORY — PX: OTHER SURGICAL HISTORY: SHX169

## 2021-10-31 ENCOUNTER — Telehealth (HOSPITAL_COMMUNITY): Payer: Self-pay | Admitting: Internal Medicine

## 2021-11-01 ENCOUNTER — Telehealth (HOSPITAL_COMMUNITY): Payer: Self-pay | Admitting: *Deleted

## 2021-11-01 NOTE — Telephone Encounter (Signed)
Returned pt call. Message left. Contact information provided. Cherre Huger, BSN Cardiac and Training and development officer

## 2021-11-22 ENCOUNTER — Telehealth (HOSPITAL_COMMUNITY): Payer: Self-pay | Admitting: Internal Medicine

## 2021-12-11 ENCOUNTER — Encounter (HOSPITAL_COMMUNITY): Payer: Self-pay

## 2021-12-30 ENCOUNTER — Telehealth (HOSPITAL_COMMUNITY): Payer: Self-pay | Admitting: Internal Medicine

## 2022-01-10 ENCOUNTER — Encounter (HOSPITAL_COMMUNITY): Payer: Self-pay

## 2022-01-10 ENCOUNTER — Telehealth (HOSPITAL_COMMUNITY): Payer: Self-pay

## 2022-01-10 NOTE — Telephone Encounter (Signed)
Attempted to call patient in regards to Cardiac Rehab - LM on VM Mailed letter 

## 2022-01-14 ENCOUNTER — Telehealth (HOSPITAL_COMMUNITY): Payer: Self-pay

## 2022-01-15 ENCOUNTER — Telehealth (HOSPITAL_COMMUNITY): Payer: Self-pay

## 2022-01-16 ENCOUNTER — Encounter (HOSPITAL_COMMUNITY)
Admission: RE | Admit: 2022-01-16 | Discharge: 2022-01-16 | Disposition: A | Discharge status: Home / Self Care | Payer: Medicare Other | Source: Ambulatory Visit | Attending: Cardiology | Admitting: Cardiology

## 2022-01-16 ENCOUNTER — Encounter (HOSPITAL_COMMUNITY): Payer: Self-pay

## 2022-01-16 VITALS — BP 122/72 | HR 87 | Ht 66.5 in | Wt 235.2 lb

## 2022-01-16 DIAGNOSIS — Z955 Presence of coronary angioplasty implant and graft: Secondary | ICD-10-CM

## 2022-01-16 LAB — GLUCOSE, CAPILLARY: Glucose-Capillary: 137 mg/dL — ABNORMAL HIGH (ref 70–99)

## 2022-01-16 NOTE — Progress Notes (Signed)
Cardiac Rehab Medication Review by a Nurse  Does the patient  feel that his/her medications are working for him/her?  YES   Has the patient been experiencing any side effects to the medications prescribed?  NO  Does the patient measure his/her own blood pressure or blood glucose at home?  YES   Does the patient have any problems obtaining medications due to transportation or finances?  NO  Understanding of regimen: good Understanding of indications: good Potential of compliance: good    Nurse comments: [name] is taking his medications as prescribed and has a good understanding of what his medications are for. [name] does not check his blood pressures at home. [name] says he checks his CBG's twice a day.    Christa See [facility] RN 01/16/2022 11:58 AM

## 2022-01-16 NOTE — Progress Notes (Addendum)
Cardiac Individual Treatment Plan  Patient Details  Name: [name] MRN: [id number] Date of Birth: [birth year] Referring Provider:   Flowsheet Row INTENSIVE CARDIAC REHAB ORIENT from 01/16/2022 in [place]  Referring Provider [name], MD [name], MD - covering)       Initial Encounter Date:  [place] from 01/16/2022 in [place]  Date 01/16/22       Visit Diagnosis: 10/08/21 S/P PCI/DES SVG OM1  Patient's Home Medications on Admission:  Current Outpatient Medications:    acetaminophen (TYLENOL) 500 MG tablet, Take 500-1,000 mg by mouth every 6 (six) hours as needed for moderate pain or mild pain., Disp: , Rfl:    anastrozole (ARIMIDEX) 1 MG tablet, Take 1 mg by mouth every other day., Disp: , Rfl:    aspirin 81 MG EC tablet, Take 81 mg by mouth daily., Disp: , Rfl:    atorvastatin (LIPITOR) 40 MG tablet, Take 40 mg by mouth at bedtime., Disp: , Rfl:    carvedilol (COREG) 25 MG tablet, Take 25 mg by mouth 2 (two) times daily with a meal., Disp: , Rfl:    Cholecalciferol (VITAMIN D3) 125 MCG (5000 UT) TABS, Take 5,000 Units by mouth daily., Disp: , Rfl:    cinacalcet (SENSIPAR) 90 MG tablet, Take 90 mg by mouth every Monday, Wednesday, and Friday., Disp: , Rfl:    clomiPHENE (CLOMID) 50 MG tablet, Take 50 mg by mouth every other day., Disp: , Rfl:    ezetimibe (ZETIA) 10 MG tablet, Take 10 mg by mouth at bedtime., Disp: , Rfl:    gabapentin (NEURONTIN) 100 MG capsule, Take 100 mg by mouth 3 (three) times daily as needed (Nerve pain)., Disp: , Rfl:    glimepiride (AMARYL) 1 MG tablet, Take 1 mg by mouth daily., Disp: , Rfl:    icosapent Ethyl (VASCEPA) 1 g capsule, Take 2 g by mouth 2 (two) times daily., Disp: , Rfl:    lanthanum (FOSRENOL) 1000 MG chewable tablet, Chew 2,000 mg by mouth 3 (three) times daily with meals., Disp: , Rfl:    lidocaine-prilocaine (EMLA)  cream, Apply 1 Application topically as needed (on port)., Disp: , Rfl:    nitroGLYCERIN (NITROSTAT) 0.4 MG SL tablet, DISSOLVE ONE TABLET UNDER THE TONGUE EVERY 5 MINUTES AS NEEDED FOR CHEST PAIN.  DO NOT EXCEED A TOTAL OF 3 DOSES IN 15 MINUTES (Patient taking differently: Place 0.4 mg under the tongue every 5 (five) minutes as needed for chest pain.), Disp: 25 tablet, Rfl: 3   REPATHA SURECLICK 875 MG/ML SOAJ, Inject 140 mg into the skin every 14 (fourteen) days., Disp: , Rfl:    tadalafil (CIALIS) 10 MG tablet, Take 10 mg by mouth every morning., Disp: , Rfl:    ticagrelor (BRILINTA) 90 MG TABS tablet, Take 90 mg by mouth 2 (two) times daily., Disp: , Rfl:    TRULICITY 1.5 IE/3.3IR SOPN, Inject 1.5 mg into the skin once a week., Disp: , Rfl:    blood glucose meter kit and supplies KIT, Dispense based on patient and insurance preference. Use QD-BID home glucose monitoring. (FOR ICD-9 250.00, 250.01)., Disp: 1 each, Rfl: 0  Past Medical History: Past Medical History:  Diagnosis Date   CAD (coronary artery disease)    CHF (congestive heart failure) (HCC)    Chronic kidney disease    Diabetes mellitus    Heart murmur    " slight"   Hyperlipidemia  Hypertension    Sleep apnea    wears CPAP    Tobacco Use: Social History   Tobacco Use  Smoking Status Former   Types: Cigarettes   Quit date: 09/17/2008   Years since quitting: 13.3  Smokeless Tobacco Never    Labs: Review Flowsheet  More data exists      Latest Ref Rng & Units 03/25/2015 01/01/2016 08/18/2016 07/06/2017 08/27/2021  Labs for ITP Cardiac and Pulmonary Rehab  Cholestrol 0 - 200 mg/dL - - 195  104  -  LDL (calc) 0 - 99 mg/dL - - 109  55  -  HDL-C >40 mg/dL - - 38  31  -  Trlycerides <150 mg/dL - - 240  88  -  Hemoglobin A1c 4.8 - 5.6 % 13.4  10.6  7.5  6.5  -  PH, Arterial 7.350 - 7.450 7.348  - - - -  PCO2 arterial 35.0 - 45.0 mmHg 40.0  - - - -  Bicarbonate 20.0 - 24.0 mEq/L 22.2  - - - -  TCO2 22 - 32 mmol/L 23  -  - - 31   Acid-base deficit 0.0 - 2.0 mmol/L 3.0  - - - -  O2 Saturation % 97.0  - - - -    Capillary Blood Glucose: Lab Results  Component Value Date   GLUCAP 137 (H) 01/16/2022   GLUCAP 118 (H) 08/27/2021   GLUCAP 100 (H) 08/27/2021   GLUCAP 132 (H) 08/27/2021   GLUCAP 289 (H) 12/10/2020     Exercise Target Goals: Exercise Program Goal: Individual exercise prescription set using results from initial 6 min walk test and THRR while considering  patient's activity barriers and safety.   Exercise Prescription Goal: Initial exercise prescription builds to 30-45 minutes a day of aerobic activity, 2-3 days per week.  Home exercise guidelines will be given to patient during program as part of exercise prescription that the participant will acknowledge.  Activity Barriers & Risk Stratification:  Activity Barriers & Cardiac Risk Stratification - 01/16/22 1157       Activity Barriers & Cardiac Risk Stratification   Activity Barriers Back Problems;Deconditioning;Muscular Weakness;Decreased Ventricular Function;Balance Concerns    Cardiac Risk Stratification High             6 Minute Walk:  6 Minute Walk     Row Name 01/16/22 1056         6 Minute Walk   Phase Initial     Distance 1328 feet     Walk Time 6 minutes     # of Rest Breaks 0     MPH 12.28     METS 3.51     RPE 11     Perceived Dyspnea  0     VO2 Peak 12.28     Symptoms Yes (comment)     Comments Left calf pain 2/10     Resting HR 83 bpm     Resting BP 122/72     Resting Oxygen Saturation  98 %     Exercise Oxygen Saturation  during 6 min walk 97 %     Max Ex. HR 91 bpm     Max Ex. BP 126/81     2 Minute Post BP 123/83              Oxygen Initial Assessment:   Oxygen Re-Evaluation:   Oxygen Discharge (Final Oxygen Re-Evaluation):   Initial Exercise Prescription:  Initial Exercise Prescription - 01/16/22 1200  Date of Initial Exercise RX and Referring Provider   Date 01/16/22     Referring Provider [name], MD [name], MD - covering)    Expected Discharge Date 03/14/22      Recumbant Bike   Level 2    Watts 50    Minutes 15    METs 3.5      NuStep   Level 2    SPM 80    Minutes 15    METs 3.5      Prescription Details   Frequency (times per week) 3    Duration Progress to 30 minutes of continuous aerobic without signs/symptoms of physical distress      Intensity   THRR 40-80% of Max Heartrate 69-138    Ratings of Perceived Exertion 11-13    Perceived Dyspnea 0-4      Progression   Progression Continue to progress workloads to maintain intensity without signs/symptoms of physical distress.      Resistance Training   Training Prescription Yes    Weight 5 lbs    Reps 10-15             Perform Capillary Blood Glucose checks as needed.  Exercise Prescription Changes:   Exercise Comments:   Exercise Goals and Review:   Exercise Goals     Row Name 01/16/22 1158             Exercise Goals   Increase Physical Activity Yes       Intervention Provide advice, education, support and counseling about physical activity/exercise needs.;Develop an individualized exercise prescription for aerobic and resistive training based on initial evaluation findings, risk stratification, comorbidities and participant's personal goals.       Expected Outcomes Short Term: Attend rehab on a regular basis to increase amount of physical activity.;Long Term: Add in home exercise to make exercise part of routine and to increase amount of physical activity.;Long Term: Exercising regularly at least 3-5 days a week.       Increase Strength and Stamina Yes       Intervention Provide advice, education, support and counseling about physical activity/exercise needs.;Develop an individualized exercise prescription for aerobic and resistive training based on initial evaluation findings, risk stratification, comorbidities and participant's personal goals.        Expected Outcomes Short Term: Increase workloads from initial exercise prescription for resistance, speed, and METs.;Short Term: Perform resistance training exercises routinely during rehab and add in resistance training at home;Long Term: Improve cardiorespiratory fitness, muscular endurance and strength as measured by increased METs and functional capacity (6MWT)       Able to understand and use rate of perceived exertion (RPE) scale Yes       Intervention Provide education and explanation on how to use RPE scale       Expected Outcomes Short Term: Able to use RPE daily in rehab to express subjective intensity level;Long Term:  Able to use RPE to guide intensity level when exercising independently       Knowledge and understanding of Target Heart Rate Range (THRR) Yes       Intervention Provide education and explanation of THRR including how the numbers were predicted and where they are located for reference       Expected Outcomes Short Term: Able to state/look up THRR;Short Term: Able to use daily as guideline for intensity in rehab;Long Term: Able to use THRR to govern intensity when exercising independently       Understanding of Exercise Prescription  Yes       Intervention Provide education, explanation, and written materials on patient's individual exercise prescription       Expected Outcomes Short Term: Able to explain program exercise prescription;Long Term: Able to explain home exercise prescription to exercise independently                Exercise Goals Re-Evaluation :   Discharge Exercise Prescription (Final Exercise Prescription Changes):   Nutrition:  Target Goals: Understanding of nutrition guidelines, daily intake of sodium <1573m, cholesterol <2079m calories 30% from fat and 7% or less from saturated fats, daily to have 5 or more servings of fruits and vegetables.  Biometrics:  Pre Biometrics - 01/16/22 1000       Pre Biometrics   Waist Circumference 46.5 inches     Hip Circumference 44.25 inches    Waist to Hip Ratio 1.05 %    Triceps Skinfold 18 mm    % Body Fat 34.6 %    Grip Strength 30 kg    Flexibility 0 in   Unable to reach   Single Leg Stand 7.43 seconds              Nutrition Therapy Plan and Nutrition Goals:   Nutrition Assessments:  MEDIFICTS Score Key: ?70 Need to make dietary changes  40-70 Heart Healthy Diet ? 40 Therapeutic Level Cholesterol Diet   Flowsheet Row CARDIAC REHAB PHASE II [facility] Total [facility] on Discharge 66      Picture Your Plate Scores: <4<41nhealthy dietary pattern with much room for improvement. 41-50 Dietary pattern unlikely to meet recommendations for good health and room for improvement. 51-60 More healthful dietary pattern, with some room for improvement.  >60 Healthy dietary pattern, although there may be some specific behaviors that could be improved.    Nutrition Goals Re-Evaluation:   Nutrition Goals Re-Evaluation:   Nutrition Goals Discharge (Final Nutrition Goals Re-Evaluation):   Psychosocial: Target Goals: Acknowledge presence or absence of significant depression and/or stress, maximize coping skills, provide positive support system. Participant is able to verbalize types and ability to use techniques and skills needed for reducing stress and depression.  Initial Review & Psychosocial Screening:  Initial Psych Review & Screening - 01/16/22 1206       Initial Review   Current issues with None Identified      Family Dynamics   Good Support System? Yes   LaVincente Libertyas his wife, friend and cousins for support     Barriers   Psychosocial barriers to participate in program There are no identifiable barriers or psychosocial needs.      Screening Interventions   Interventions Encouraged to exercise;Provide feedback about the scores to participant    Expected Outcomes Short Term goal: Identification  and review with participant of any Quality of Life or Depression concerns found by scoring the questionnaire.;Long Term goal: The participant improves quality of Life and PHQ9 Scores as seen by post scores and/or verbalization of changes             Quality of Life Scores:  Quality of Life - 01/16/22 1152       Quality of Life   Select Quality of Life      Quality of Life Scores   Health/Function Pre 21.96 %    Socioeconomic Pre 17.21 %    Psych/Spiritual Pre 21.43 %    Family Pre 21 %    GLOBAL Pre  20.69 %            Scores of 19 and below usually indicate a poorer quality of life in these areas.  A difference of  2-3 points is a clinically meaningful difference.  A difference of 2-3 points in the total score of the Quality of Life Index has been associated with significant improvement in overall quality of life, self-image, physical symptoms, and general health in studies assessing change in quality of life.  PHQ-9: Review Flowsheet  More data exists      01/16/2022 12/24/2020 11/01/2020 08/29/2016 08/18/2016  Depression screen PHQ 2/9  Decreased Interest 0 0 0 0 0  Down, Depressed, Hopeless 0 0 0 0 0  PHQ - 2 Score 0 0 0 0 0   Interpretation of Total Score  Total Score Depression Severity:  1-4 = Minimal depression, 5-9 = Mild depression, 10-14 = Moderate depression, 15-19 = Moderately severe depression, 20-27 = Severe depression   Psychosocial Evaluation and Intervention:   Psychosocial Re-Evaluation:   Psychosocial Discharge (Final Psychosocial Re-Evaluation):   Vocational Rehabilitation: Provide vocational rehab assistance to qualifying candidates.   Vocational Rehab Evaluation & Intervention:  Vocational Rehab - 01/16/22 1209       Initial Vocational Rehab Evaluation & Intervention   Assessment shows need for Vocational Rehabilitation No   [name] works full time and does not need vocational rehab at this time            Education: Education Goals:  Education classes will be provided on a weekly basis, covering required topics. Participant will state understanding/return demonstration of topics presented.     Core Videos: Exercise    Move It!  Clinical staff conducted group or individual video education with verbal and written material and guidebook.  Patient learns the recommended Pritikin exercise program. Exercise with the goal of living a long, healthy life. Some of the health benefits of exercise include controlled diabetes, healthier blood pressure levels, improved cholesterol levels, improved heart and lung capacity, improved sleep, and better body composition. Everyone should speak with their doctor before starting or changing an exercise routine.  Biomechanical Limitations Clinical staff conducted group or individual video education with verbal and written material and guidebook.  Patient learns how biomechanical limitations can impact exercise and how we can mitigate and possibly overcome limitations to have an impactful and balanced exercise routine.  Body Composition Clinical staff conducted group or individual video education with verbal and written material and guidebook.  Patient learns that body composition (ratio of muscle mass to fat mass) is a key component to assessing overall fitness, rather than body weight alone. Increased fat mass, especially visceral belly fat, can put [place] at increased risk for metabolic syndrome, type 2 diabetes, heart disease, and even death. It is recommended to combine diet and exercise (cardiovascular and resistance training) to improve your body composition. Seek guidance from your physician and exercise physiologist before implementing an exercise routine.  Exercise Action Plan Clinical staff conducted group or individual video education with verbal and written material and guidebook.  Patient learns the recommended strategies to achieve and enjoy long-term exercise adherence, including  variety, self-motivation, self-efficacy, and positive decision making. Benefits of exercise include fitness, good health, weight management, more energy, better sleep, less stress, and overall well-being.  Medical   Heart Disease Risk Reduction Clinical staff conducted group or individual video education with verbal and written material and guidebook.  Patient learns our heart is our most vital organ as it circulates  oxygen, nutrients, white blood cells, and hormones throughout the entire body, and carries waste away. Data supports a plant-based eating plan like the Pritikin Program for its effectiveness in slowing progression of and reversing heart disease. The video provides a number of recommendations to address heart disease.   Metabolic Syndrome and Belly Fat  Clinical staff conducted group or individual video education with verbal and written material and guidebook.  Patient learns what metabolic syndrome is, how it leads to heart disease, and how one can reverse it and keep it from coming back. You have metabolic syndrome if you have 3 of the following 5 criteria: abdominal obesity, high blood pressure, high triglycerides, low HDL cholesterol, and high blood sugar.  Hypertension and Heart Disease Clinical staff conducted group or individual video education with verbal and written material and guidebook.  Patient learns that high blood pressure, or hypertension, is very common in the [place]. Hypertension is largely due to excessive salt intake, but other important risk factors include being overweight, physical inactivity, drinking too much alcohol, smoking, and not eating enough potassium from fruits and vegetables. High blood pressure is a leading risk factor for heart attack, stroke, congestive heart failure, dementia, kidney failure, and premature death. Long-term effects of excessive salt intake include stiffening of the arteries and thickening of heart muscle and organ damage.  Recommendations include ways to reduce hypertension and the risk of heart disease.  Diseases of Our Time - Focusing on Diabetes Clinical staff conducted group or individual video education with verbal and written material and guidebook.  Patient learns why the best way to stop diseases of our time is prevention, through food and other lifestyle changes. Medicine (such as prescription pills and surgeries) is often only a Band-Aid on the problem, not a long-term solution. Most common diseases of our time include obesity, type 2 diabetes, hypertension, heart disease, and cancer. The Pritikin Program is recommended and has been proven to help reduce, reverse, and/or prevent the damaging effects of metabolic syndrome.  Nutrition   Overview of the Pritikin Eating Plan  Clinical staff conducted group or individual video education with verbal and written material and guidebook.  Patient learns about the [place] for disease risk reduction. The [place] emphasizes a wide variety of unrefined, minimally-processed carbohydrates, like fruits, vegetables, whole grains, and legumes. Go, Caution, and Stop food choices are explained. Plant-based and lean animal proteins are emphasized. Rationale provided for low sodium intake for blood pressure control, low added sugars for blood sugar stabilization, and low added fats and oils for coronary artery disease risk reduction and weight management.  Calorie Density  Clinical staff conducted group or individual video education with verbal and written material and guidebook.  Patient learns about calorie density and how it impacts the Pritikin Eating Plan. Knowing the characteristics of the food you choose will help you decide whether those foods will lead to weight gain or weight loss, and whether you want to consume more or less of them. Weight loss is usually a side effect of the Pritikin Eating Plan because of its focus on low calorie-dense  foods.  Label Reading  Clinical staff conducted group or individual video education with verbal and written material and guidebook.  Patient learns about the Pritikin recommended label reading guidelines and corresponding recommendations regarding calorie density, added sugars, sodium content, and whole grains.  Dining Out - Part 1  Clinical staff conducted group or individual video education with verbal and written material and guidebook.  Patient learns that restaurant meals can be sabotaging because they can be so high in calories, fat, sodium, and/or sugar. Patient learns recommended strategies on how to positively address this and avoid unhealthy pitfalls.  Facts on Fats  Clinical staff conducted group or individual video education with verbal and written material and guidebook.  Patient learns that lifestyle modifications can be just as effective, if not more so, as many medications for lowering your risk of heart disease. A Pritikin lifestyle can help to reduce your risk of inflammation and atherosclerosis (cholesterol build-up, or plaque, in the artery walls). Lifestyle interventions such as dietary choices and physical activity address the cause of atherosclerosis. A review of the types of fats and their impact on blood cholesterol levels, along with dietary recommendations to reduce fat intake is also included.  Nutrition Action Plan  Clinical staff conducted group or individual video education with verbal and written material and guidebook.  Patient learns how to incorporate Pritikin recommendations into their lifestyle. Recommendations include planning and keeping personal health goals in mind as an important part of their success.  Healthy Mind-Set    Healthy Minds, Bodies, Hearts  Clinical staff conducted group or individual video education with verbal and written material and guidebook.  Patient learns how to identify when they are stressed. Video will discuss the impact of that  stress, as well as the many benefits of stress management. Patient will also be introduced to stress management techniques. The way we think, act, and feel has an impact on our hearts.  How Our Thoughts Can Heal Our Hearts  Clinical staff conducted group or individual video education with verbal and written material and guidebook.  Patient learns that negative thoughts can cause depression and anxiety. This can result in negative lifestyle behavior and serious health problems. Cognitive behavioral therapy is an effective method to help control our thoughts in order to change and improve our emotional outlook.  Additional Videos:  Exercise    Improving Performance  Clinical staff conducted group or individual video education with verbal and written material and guidebook.  Patient learns to use a non-linear approach by alternating intensity levels and lengths of time spent exercising to help burn more calories and lose more body fat. Cardiovascular exercise helps improve heart health, metabolism, hormonal balance, blood sugar control, and recovery from fatigue. Resistance training improves strength, endurance, balance, coordination, reaction time, metabolism, and muscle mass. Flexibility exercise improves circulation, posture, and balance. Seek guidance from your physician and exercise physiologist before implementing an exercise routine and learn your capabilities and proper form for all exercise.  Introduction to Yoga  Clinical staff conducted group or individual video education with verbal and written material and guidebook.  Patient learns about yoga, a discipline of the coming together of mind, breath, and body. The benefits of yoga include improved flexibility, improved range of motion, better posture and core strength, increased lung function, weight loss, and positive self-image. Yoga's heart health benefits include lowered blood pressure, healthier heart rate, decreased cholesterol and  triglyceride levels, improved immune function, and reduced stress. Seek guidance from your physician and exercise physiologist before implementing an exercise routine and learn your capabilities and proper form for all exercise.  Medical   Aging: Enhancing Your Quality of Life  Clinical staff conducted group or individual video education with verbal and written material and guidebook.  Patient learns key strategies and recommendations to stay in good physical health and enhance quality of life, such as prevention strategies, having an advocate,  securing a Health Care Proxy and Power of Attorney, and keeping a list of medications and system for tracking them. It also discusses how to avoid risk for bone loss.  Biology of Weight Control  Clinical staff conducted group or individual video education with verbal and written material and guidebook.  Patient learns that weight gain occurs because we consume more calories than we burn (eating more, moving less). Even if your body weight is normal, you may have higher ratios of fat compared to muscle mass. Too much body fat puts you at increased risk for cardiovascular disease, heart attack, stroke, type 2 diabetes, and obesity-related cancers. In addition to exercise, following the [place] can help reduce your risk.  Decoding Lab Results  Clinical staff conducted group or individual video education with verbal and written material and guidebook.  Patient learns that lab test reflects one measurement whose values change over time and are influenced by many factors, including medication, stress, sleep, exercise, food, hydration, pre-existing medical conditions, and more. It is recommended to use the knowledge from this video to become more involved with your lab results and evaluate your numbers to speak with your doctor.   Diseases of Our Time - Overview  Clinical staff conducted group or individual video education with verbal and written  material and guidebook.  Patient learns that according to the CDC, 50% to 70% of chronic diseases (such as obesity, type 2 diabetes, elevated lipids, hypertension, and heart disease) are avoidable through lifestyle improvements including healthier food choices, listening to satiety cues, and increased physical activity.  Sleep Disorders Clinical staff conducted group or individual video education with verbal and written material and guidebook.  Patient learns how good quality and duration of sleep are important to overall health and well-being. Patient also learns about sleep disorders and how they impact health along with recommendations to address them, including discussing with a physician.  Nutrition  Dining Out - Part 2 Clinical staff conducted group or individual video education with verbal and written material and guidebook.  Patient learns how to plan ahead and communicate in order to maximize their dining experience in a healthy and nutritious manner. Included are recommended food choices based on the type of restaurant the patient is visiting.   Fueling a Best boy conducted group or individual video education with verbal and written material and guidebook.  There is a strong connection between our food choices and our health. Diseases like obesity and type 2 diabetes are very prevalent and are in large-part due to lifestyle choices. The Pritikin Eating Plan provides plenty of food and hunger-curbing satisfaction. It is easy to follow, affordable, and helps reduce health risks.  Menu Workshop  Clinical staff conducted group or individual video education with verbal and written material and guidebook.  Patient learns that restaurant meals can sabotage health goals because they are often packed with calories, fat, sodium, and sugar. Recommendations include strategies to plan ahead and to communicate with the manager, chef, or server to help order a healthier  meal.  Planning Your Eating Strategy  Clinical staff conducted group or individual video education with verbal and written material and guidebook.  Patient learns about the [place] and its benefit of reducing the risk of disease. The [place] does not focus on calories. Instead, it emphasizes high-quality, nutrient-rich foods. By knowing the characteristics of the foods, we choose, we can determine their calorie density and make informed decisions.  Targeting Your Nutrition  Priorities  Clinical staff conducted group or individual video education with verbal and written material and guidebook.  Patient learns that lifestyle habits have a tremendous impact on disease risk and progression. This video provides eating and physical activity recommendations based on your personal health goals, such as reducing LDL cholesterol, losing weight, preventing or controlling type 2 diabetes, and reducing high blood pressure.  Vitamins and Minerals  Clinical staff conducted group or individual video education with verbal and written material and guidebook.  Patient learns different ways to obtain key vitamins and minerals, including through a recommended healthy diet. It is important to discuss all supplements you take with your doctor.   Healthy Mind-Set    Smoking Cessation  Clinical staff conducted group or individual video education with verbal and written material and guidebook.  Patient learns that cigarette smoking and tobacco addiction pose a serious health risk which affects millions of people. Stopping smoking will significantly reduce the risk of heart disease, lung disease, and many forms of cancer. Recommended strategies for quitting are covered, including working with your doctor to develop a successful plan.  Culinary   Becoming a Financial trader conducted group or individual video education with verbal and written material and guidebook.  Patient learns  that cooking at home can be healthy, cost-effective, quick, and puts them in control. Keys to cooking healthy recipes will include looking at your recipe, assessing your equipment needs, planning ahead, making it simple, choosing cost-effective seasonal ingredients, and limiting the use of added fats, salts, and sugars.  Cooking - Breakfast and Snacks  Clinical staff conducted group or individual video education with verbal and written material and guidebook.  Patient learns how important breakfast is to satiety and nutrition through the entire day. Recommendations include key foods to eat during breakfast to help stabilize blood sugar levels and to prevent overeating at meals later in the day. Planning ahead is also a key component.  Cooking - Human resources officer conducted group or individual video education with verbal and written material and guidebook.  Patient learns eating strategies to improve overall health, including an approach to cook more at home. Recommendations include thinking of animal protein as a side on your plate rather than center stage and focusing instead on lower calorie dense options like vegetables, fruits, whole grains, and plant-based proteins, such as beans. Making sauces in large quantities to freeze for later and leaving the skin on your vegetables are also recommended to maximize your experience.  Cooking - Healthy Salads and Dressing Clinical staff conducted group or individual video education with verbal and written material and guidebook.  Patient learns that vegetables, fruits, whole grains, and legumes are the foundations of the [place]. Recommendations include how to incorporate each of these in flavorful and healthy salads, and how to create homemade salad dressings. Proper handling of ingredients is also covered. Cooking - Soups and [employer] - Soups and Desserts Clinical staff conducted group or individual video education with  verbal and written material and guidebook.  Patient learns that Pritikin soups and desserts make for easy, nutritious, and delicious snacks and meal components that are low in sodium, fat, sugar, and calorie density, while high in vitamins, minerals, and filling fiber. Recommendations include simple and healthy ideas for soups and desserts.   Overview     The Pritikin Solution Program Overview Clinical staff conducted group or individual video education with verbal and written material and guidebook.  Patient  learns that the results of the Pritikin Program have been documented in more than 100 articles published in peer-reviewed journals, and the benefits include reducing risk factors for (and, in some cases, even reversing) high cholesterol, high blood pressure, type 2 diabetes, obesity, and more! An overview of the three key pillars of the Pritikin Program will be covered: eating well, doing regular exercise, and having a healthy mind-set.  WORKSHOPS  Exercise: Exercise Basics: Building Your Action Plan Clinical staff led group instruction and group discussion with PowerPoint presentation and patient guidebook. To enhance the learning environment the use of posters, models and videos may be added. At the conclusion of this workshop, patients will comprehend the difference between physical activity and exercise, as well as the benefits of incorporating both, into their routine. Patients will understand the FITT (Frequency, Intensity, Time, and Type) principle and how to use it to build an exercise action plan. In addition, safety concerns and other considerations for exercise and cardiac rehab will be addressed by the presenter. The purpose of this lesson is to promote a comprehensive and effective weekly exercise routine in order to improve patients' overall level of fitness.   Managing Heart Disease: Your Path to a Healthier Heart Clinical staff led group instruction and group discussion with  PowerPoint presentation and patient guidebook. To enhance the learning environment the use of posters, models and videos may be added.At the conclusion of this workshop, patients will understand the anatomy and physiology of the heart. Additionally, they will understand how Pritikin's three pillars impact the risk factors, the progression, and the management of heart disease.  The purpose of this lesson is to provide a high-level overview of the heart, heart disease, and how the Pritikin lifestyle positively impacts risk factors.  Exercise Biomechanics Clinical staff led group instruction and group discussion with PowerPoint presentation and patient guidebook. To enhance the learning environment the use of posters, models and videos may be added. Patients will learn how the structural parts of their bodies function and how these functions impact their daily activities, movement, and exercise. Patients will learn how to promote a neutral spine, learn how to manage pain, and identify ways to improve their physical movement in order to promote healthy living. The purpose of this lesson is to expose patients to common physical limitations that impact physical activity. Participants will learn practical ways to adapt and manage aches and pains, and to minimize their effect on regular exercise. Patients will learn how to maintain good posture while sitting, walking, and lifting.  Balance Training and Fall Prevention  Clinical staff led group instruction and group discussion with PowerPoint presentation and patient guidebook. To enhance the learning environment the use of posters, models and videos may be added. At the conclusion of this workshop, patients will understand the importance of their sensorimotor skills (vision, proprioception, and the vestibular system) in maintaining their ability to balance as they age. Patients will apply a variety of balancing exercises that are appropriate for their  current level of function. Patients will understand the common causes for poor balance, possible solutions to these problems, and ways to modify their physical environment in order to minimize their fall risk. The purpose of this lesson is to teach patients about the importance of maintaining balance as they age and ways to minimize their risk of falling.  WORKSHOPS   Nutrition:  Fueling a Scientist, research (physical sciences) led group instruction and group discussion with PowerPoint presentation and patient guidebook. To enhance the learning environment  the use of posters, models and videos may be added. Patients will review the foundational principles of the [place] and understand what constitutes a serving size in each of the food groups. Patients will also learn Pritikin-friendly foods that are better choices when away from home and review make-ahead meal and snack options. Calorie density will be reviewed and applied to three nutrition priorities: weight maintenance, weight loss, and weight gain. The purpose of this lesson is to reinforce (in a group setting) the key concepts around what patients are recommended to eat and how to apply these guidelines when away from home by planning and selecting Pritikin-friendly options. Patients will understand how calorie density may be adjusted for different weight management goals.  Mindful Eating  Clinical staff led group instruction and group discussion with PowerPoint presentation and patient guidebook. To enhance the learning environment the use of posters, models and videos may be added. Patients will briefly review the concepts of the [place] and the importance of low-calorie dense foods. The concept of mindful eating will be introduced as well as the importance of paying attention to internal hunger signals. Triggers for non-hunger eating and techniques for dealing with triggers will be explored. The purpose of this lesson is to provide  patients with the opportunity to review the basic principles of the [place], discuss the value of eating mindfully and how to measure internal cues of hunger and fullness using the Hunger Scale. Patients will also discuss reasons for non-hunger eating and learn strategies to use for controlling emotional eating.  Targeting Your Nutrition Priorities Clinical staff led group instruction and group discussion with PowerPoint presentation and patient guidebook. To enhance the learning environment the use of posters, models and videos may be added. Patients will learn how to determine their genetic susceptibility to disease by reviewing their family history. Patients will gain insight into the importance of diet as part of an overall healthy lifestyle in mitigating the impact of genetics and other environmental insults. The purpose of this lesson is to provide patients with the opportunity to assess their personal nutrition priorities by looking at their family history, their own health history and current risk factors. Patients will also be able to discuss ways of prioritizing and modifying the [place] for their highest risk areas  Menu  Clinical staff led group instruction and group discussion with PowerPoint presentation and patient guidebook. To enhance the learning environment the use of posters, models and videos may be added. Using menus brought in from [employer], or printed from [employer], patients will apply the [place] dining out guidelines that were presented in the [employer] video. Patients will also be able to practice these guidelines in a variety of provided scenarios. The purpose of this lesson is to provide patients with the opportunity to practice hands-on learning of the [place] with actual menus and practice scenarios.  Label Reading Clinical staff led group instruction and group discussion with PowerPoint  presentation and patient guidebook. To enhance the learning environment the use of posters, models and videos may be added. Patients will review and discuss the Pritikin label reading guidelines presented in Pritikin's Label Reading Educational series video. Using fool labels brought in from local grocery stores and markets, patients will apply the label reading guidelines and determine if the packaged food meet the Pritikin guidelines. The purpose of this lesson is to provide patients with the opportunity to review, discuss, and practice hands-on  learning of the Pritikin Label Reading guidelines with actual packaged food labels. [place] Workshops are designed to teach patients ways to prepare quick, simple, and affordable recipes at home. The importance of nutrition's role in chronic disease risk reduction is reflected in its emphasis in the overall Pritikin program. By learning how to prepare essential core Pritikin Eating Plan recipes, patients will increase control over what they eat; be able to customize the flavor of foods without the use of added salt, sugar, or fat; and improve the quality of the food they consume. By learning a set of core recipes which are easily assembled, quickly prepared, and affordable, patients are more likely to prepare more healthy foods at home. These workshops focus on convenient breakfasts, simple entres, side dishes, and desserts which can be prepared with minimal effort and are consistent with nutrition recommendations for cardiovascular risk reduction. Cooking [employer] are taught by a Engineer, materials (RD) who has been trained by the [employer]. The chef or RD has a clear understanding of the importance of minimizing - if not completely eliminating - added fat, sugar, and sodium in recipes. Throughout the series of [place] Workshop sessions, patients will learn about healthy ingredients and  efficient methods of cooking to build confidence in their capability to prepare    Cooking School weekly topics:  Adding Flavor- Sodium-Free  Fast and Healthy Breakfasts  Powerhouse Plant-Based Proteins  Satisfying Salads and Dressings  Simple Sides and Sauces  International Cuisine-Spotlight on the [place] Zones  Delicious Desserts  Savory Soups  Efficiency Cooking - Meals in a Snap  Tasty Appetizers and Snacks  Comforting Weekend Breakfasts  One-Pot Wonders   Fast Evening Meals  Easy [place] (Psychosocial): New Thoughts, New Behaviors Clinical staff led group instruction and group discussion with PowerPoint presentation and patient guidebook. To enhance the learning environment the use of posters, models and videos may be added. Patients will learn and practice techniques for developing effective health and lifestyle goals. Patients will be able to effectively apply the goal setting process learned to develop at least one new personal goal.  The purpose of this lesson is to expose patients to a new skill set of behavior modification techniques such as techniques setting SMART goals, overcoming barriers, and achieving new thoughts and new behaviors.  Managing Moods and Relationships Clinical staff led group instruction and group discussion with PowerPoint presentation and patient guidebook. To enhance the learning environment the use of posters, models and videos may be added. Patients will learn how emotional and chronic stress factors can impact their health and relationships. They will learn healthy ways to manage their moods and utilize positive coping mechanisms. In addition, ICR patients will learn ways to improve communication skills. The purpose of this lesson is to expose patients to ways of understanding how one's mood and health are intimately connected. Developing a healthy outlook can help build positive  relationships and connections with others. Patients will understand the importance of utilizing effective communication skills that include actively listening and being heard. They will learn and understand the importance of the "4 Cs" and especially Connections in fostering of a Healthy Mind-Set.  Healthy Sleep for a Healthy Heart Clinical staff led group instruction and group discussion with PowerPoint presentation and patient guidebook. To enhance the learning environment the use of posters, models and videos may be added. At the conclusion of this  workshop, patients will be able to demonstrate knowledge of the importance of sleep to overall health, well-being, and quality of life. They will understand the symptoms of, and treatments for, common sleep disorders. Patients will also be able to identify daytime and nighttime behaviors which impact sleep, and they will be able to apply these tools to help manage sleep-related challenges. The purpose of this lesson is to provide patients with a general overview of sleep and outline the importance of quality sleep. Patients will learn about a few of the most common sleep disorders. Patients will also be introduced to the concept of "sleep hygiene," and discover ways to self-manage certain sleeping problems through simple daily behavior changes. Finally, the workshop will motivate patients by clarifying the links between quality sleep and their goals of heart-healthy living.   Recognizing and Reducing Stress Clinical staff led group instruction and group discussion with PowerPoint presentation and patient guidebook. To enhance the learning environment the use of posters, models and videos may be added. At the conclusion of this workshop, patients will be able to understand the types of stress reactions, differentiate between acute and chronic stress, and recognize the impact that chronic stress has on their health. They will also be able to apply different coping  mechanisms, such as reframing negative self-talk. Patients will have the opportunity to practice a variety of stress management techniques, such as deep abdominal breathing, progressive muscle relaxation, and/or guided imagery.  The purpose of this lesson is to educate patients on the role of stress in their lives and to provide healthy techniques for coping with it.  Learning Barriers/Preferences:  Learning Barriers/Preferences - 01/16/22 1155       Learning Barriers/Preferences   Learning Barriers Sight   wears glasses   Learning Preferences Skilled Demonstration;Individual Instruction;Group Instruction             Education Topics:  Knowledge Questionnaire Score:  Knowledge Questionnaire Score - 01/16/22 1153       Knowledge Questionnaire Score   Pre Score 23/24             Core Components/Risk Factors/Patient Goals at Admission:  Personal Goals and Risk Factors at Admission - 01/16/22 1153       Core Components/Risk Factors/Patient Goals on Admission    Weight Management Yes;Obesity;Weight Loss    Intervention Weight Management: Develop a combined nutrition and exercise program designed to reach desired caloric intake, while maintaining appropriate intake of nutrient and fiber, sodium and fats, and appropriate energy expenditure required for the weight goal.;Weight Management: Provide education and appropriate resources to help participant work on and attain dietary goals.;Weight Management/Obesity: Establish reasonable short term and long term weight goals.;Obesity: Provide education and appropriate resources to help participant work on and attain dietary goals.    Admit Weight 235 lb 3.7 oz (106.7 kg)    Expected Outcomes Short Term: Continue to assess and modify interventions until short term weight is achieved;Long Term: Adherence to nutrition and physical activity/exercise program aimed toward attainment of established weight goal;Weight Maintenance: Understanding of  the daily nutrition guidelines, which includes 25-35% calories from fat, 7% or less cal from saturated fats, less than $RemoveB'200mg'JSTrdtss$  cholesterol, less than 1.5gm of sodium, & 5 or more servings of fruits and vegetables daily;Weight Loss: Understanding of general recommendations for a balanced deficit meal plan, which promotes 1-2 lb weight loss per week and includes a negative energy balance of [phone] kcal/d;Understanding recommendations for meals to include 15-35% energy as protein, 25-35% energy from fat, 35-60%  energy from carbohydrates, less than $RemoveB'200mg'BLcnWlOJ$  of dietary cholesterol, 20-35 gm of total fiber daily;Understanding of distribution of calorie intake throughout the day with the consumption of 4-5 meals/snacks    Diabetes Yes    Intervention Provide education about signs/symptoms and action to take for hypo/hyperglycemia.;Provide education about proper nutrition, including hydration, and aerobic/resistive exercise prescription along with prescribed medications to achieve blood glucose in normal ranges: Fasting glucose 65-99 mg/dL    Expected Outcomes Short Term: Participant verbalizes understanding of the signs/symptoms and immediate care of hyper/hypoglycemia, proper foot care and importance of medication, aerobic/resistive exercise and nutrition plan for blood glucose control.;Long Term: Attainment of HbA1C < 7%.    Heart Failure Yes    Intervention Provide a combined exercise and nutrition program that is supplemented with education, support and counseling about heart failure. Directed toward relieving symptoms such as shortness of breath, decreased exercise tolerance, and extremity edema.    Expected Outcomes Improve functional capacity of life;Short term: Attendance in program 2-3 days a week with increased exercise capacity. Reported lower sodium intake. Reported increased fruit and vegetable intake. Reports medication compliance.;Short term: Daily weights obtained and reported for increase. Utilizing  diuretic protocols set by physician.;Long term: Adoption of self-care skills and reduction of barriers for early signs and symptoms recognition and intervention leading to self-care maintenance.    Hypertension Yes    Intervention Provide education on lifestyle modifcations including regular physical activity/exercise, weight management, moderate sodium restriction and increased consumption of fresh fruit, vegetables, and low fat dairy, alcohol moderation, and smoking cessation.;Monitor prescription use compliance.    Expected Outcomes Short Term: Continued assessment and intervention until BP is < 140/41mm HG in hypertensive participants. < 130/36mm HG in hypertensive participants with diabetes, heart failure or chronic kidney disease.;Long Term: Maintenance of blood pressure at goal levels.    Lipids Yes    Intervention Provide education and support for participant on nutrition & aerobic/resistive exercise along with prescribed medications to achieve LDL '70mg'$ , HDL >$Remo'40mg'IPbdQ$ .    Expected Outcomes Short Term: Participant states understanding of desired cholesterol values and is compliant with medications prescribed. Participant is following exercise prescription and nutrition guidelines.;Long Term: Cholesterol controlled with medications as prescribed, with individualized exercise RX and with personalized nutrition plan. Value goals: LDL < $Rem'70mg'rQRK$ , HDL > 40 mg.    Stress Yes    Intervention Offer individual and/or small group education and counseling on adjustment to heart disease, stress management and health-related lifestyle change. Teach and support self-help strategies.;Refer participants experiencing significant psychosocial distress to appropriate mental health specialists for further evaluation and treatment. When possible, include family members and significant others in education/counseling sessions.    Expected Outcomes Short Term: Participant demonstrates changes in health-related behavior, relaxation  and other stress management skills, ability to obtain effective social support, and compliance with psychotropic medications if prescribed.;Long Term: Emotional wellbeing is indicated by absence of clinically significant psychosocial distress or social isolation.             Core Components/Risk Factors/Patient Goals Review:    Core Components/Risk Factors/Patient Goals at Discharge (Final Review):    ITP Comments:  ITP Comments     Row Name 01/16/22 1055           ITP Comments Dr [name] MD, Medical Director. Introduction to [facility]/ Intensive Cardiac Rehab. Initial Orientation Packet reviewed with the patient                Comments: Participant attended orientation for the cardiac  rehabilitation program on  01/16/2022  to perform initial intake and exercise walk test. Patient introduced to the [facility] education and orientation packet was reviewed. Completed 6-minute walk test, measurements, initial ITP, and exercise prescription. Vital signs stable. Telemetry-normal sinus rhythm, t wave inversion occasional PVC,[place] did report 2/10 left calf pain after the walk test which resolved with rest. asymptomatic. Will send today's ECG tracing to Dr Tula Nakayama office for review. T wave inversion noted in lead 2 today and in lead 3 on the last 12 lead ECG on Aug 27, 2021 Harrell Gave RN BSN   Service time was from 1000 to 1134.

## 2022-01-20 ENCOUNTER — Encounter (HOSPITAL_COMMUNITY): Admission: RE | Admit: 2022-01-20 | Payer: Medicare Other | Source: Ambulatory Visit

## 2022-01-22 ENCOUNTER — Encounter (HOSPITAL_COMMUNITY): Payer: Medicare Other

## 2022-01-22 ENCOUNTER — Encounter (HOSPITAL_COMMUNITY): Payer: Self-pay | Admitting: *Deleted

## 2022-01-22 DIAGNOSIS — Z955 Presence of coronary angioplasty implant and graft: Secondary | ICD-10-CM

## 2022-01-22 NOTE — Progress Notes (Signed)
Cardiac Individual Treatment Plan  Patient Details  Name: GEMINI BEAUMIER MRN: [id number] Date of Birth: 10/09/1975 Referring Provider:   Flowsheet Row INTENSIVE CARDIAC REHAB ORIENT from 01/16/2022 in [facility]  Referring Provider [name], MD [name], MD - covering)       Initial Encounter Date:  [facility] from 01/16/2022 in [place]  Date 01/16/22       Visit Diagnosis: 10/08/21 S/P PCI/DES SVG OM1  Patient's Home Medications on Admission:  Current Outpatient Medications:    acetaminophen (TYLENOL) 500 MG tablet, Take 500-1,000 mg by mouth every 6 (six) hours as needed for moderate pain or mild pain., Disp: , Rfl:    anastrozole (ARIMIDEX) 1 MG tablet, Take 1 mg by mouth every other day., Disp: , Rfl:    aspirin 81 MG EC tablet, Take 81 mg by mouth daily., Disp: , Rfl:    atorvastatin (LIPITOR) 40 MG tablet, Take 40 mg by mouth at bedtime., Disp: , Rfl:    blood glucose meter kit and supplies KIT, Dispense based on patient and insurance preference. Use QD-BID home glucose monitoring. (FOR ICD-9 250.00, 250.01)., Disp: 1 each, Rfl: 0   carvedilol (COREG) 25 MG tablet, Take 25 mg by mouth 2 (two) times daily with a meal., Disp: , Rfl:    Cholecalciferol (VITAMIN D3) 125 MCG (5000 UT) TABS, Take 5,000 Units by mouth daily., Disp: , Rfl:    cinacalcet (SENSIPAR) 90 MG tablet, Take 90 mg by mouth every Monday, Wednesday, and Friday., Disp: , Rfl:    clomiPHENE (CLOMID) 50 MG tablet, Take 50 mg by mouth every other day., Disp: , Rfl:    ezetimibe (ZETIA) 10 MG tablet, Take 10 mg by mouth at bedtime., Disp: , Rfl:    gabapentin (NEURONTIN) 100 MG capsule, Take 100 mg by mouth 3 (three) times daily as needed (Nerve pain)., Disp: , Rfl:    glimepiride (AMARYL) 1 MG tablet, Take 1 mg by mouth daily., Disp: , Rfl:    icosapent Ethyl (VASCEPA) 1 g capsule, Take 2 g by mouth 2  (two) times daily., Disp: , Rfl:    lanthanum (FOSRENOL) 1000 MG chewable tablet, Chew 2,000 mg by mouth 3 (three) times daily with meals., Disp: , Rfl:    lidocaine-prilocaine (EMLA) cream, Apply 1 Application topically as needed (on port)., Disp: , Rfl:    nitroGLYCERIN (NITROSTAT) 0.4 MG SL tablet, DISSOLVE ONE TABLET UNDER THE TONGUE EVERY 5 MINUTES AS NEEDED FOR CHEST PAIN.  DO NOT EXCEED A TOTAL OF 3 DOSES IN 15 MINUTES (Patient taking differently: Place 0.4 mg under the tongue every 5 (five) minutes as needed for chest pain.), Disp: 25 tablet, Rfl: 3   REPATHA SURECLICK 808 MG/ML SOAJ, Inject 140 mg into the skin every 14 (fourteen) days., Disp: , Rfl:    tadalafil (CIALIS) 10 MG tablet, Take 10 mg by mouth every morning., Disp: , Rfl:    ticagrelor (BRILINTA) 90 MG TABS tablet, Take 90 mg by mouth 2 (two) times daily., Disp: , Rfl:    TRULICITY 1.5 UP/1.0RP SOPN, Inject 1.5 mg into the skin once a week., Disp: , Rfl:   Past Medical History: Past Medical History:  Diagnosis Date   CAD (coronary artery disease)    CHF (congestive heart failure) (HCC)    Chronic kidney disease    Diabetes mellitus    Heart murmur    " slight"   Hyperlipidemia  Hypertension    Sleep apnea    wears CPAP    Tobacco Use: Social History   Tobacco Use  Smoking Status Former   Types: Cigarettes   Quit date: 09/17/2008   Years since quitting: 13.3  Smokeless Tobacco Never    Labs: Review Flowsheet  More data exists      Latest Ref Rng & Units 03/25/2015 01/01/2016 08/18/2016 07/06/2017 08/27/2021  Labs for ITP Cardiac and Pulmonary Rehab  Cholestrol 0 - 200 mg/dL - - 195  104  -  LDL (calc) 0 - 99 mg/dL - - 109  55  -  HDL-C >40 mg/dL - - 38  31  -  Trlycerides <150 mg/dL - - 240  88  -  Hemoglobin A1c 4.8 - 5.6 % 13.4  10.6  7.5  6.5  -  PH, Arterial 7.350 - 7.450 7.348  - - - -  PCO2 arterial 35.0 - 45.0 mmHg 40.0  - - - -  Bicarbonate 20.0 - 24.0 mEq/L 22.2  - - - -  TCO2 22 - 32 mmol/L 23   - - - 31   Acid-base deficit 0.0 - 2.0 mmol/L 3.0  - - - -  O2 Saturation % 97.0  - - - -    Capillary Blood Glucose: Lab Results  Component Value Date   GLUCAP 137 (H) 01/16/2022   GLUCAP 118 (H) 08/27/2021   GLUCAP 100 (H) 08/27/2021   GLUCAP 132 (H) 08/27/2021   GLUCAP 289 (H) 12/10/2020     Exercise Target Goals: Exercise Program Goal: Individual exercise prescription set using results from initial 6 min walk test and THRR while considering  patient's activity barriers and safety.   Exercise Prescription Goal: Initial exercise prescription builds to 30-45 minutes a day of aerobic activity, 2-3 days per week.  Home exercise guidelines will be given to patient during program as part of exercise prescription that the participant will acknowledge.  Activity Barriers & Risk Stratification:  Activity Barriers & Cardiac Risk Stratification - 01/16/22 1157       Activity Barriers & Cardiac Risk Stratification   Activity Barriers Back Problems;Deconditioning;Muscular Weakness;Decreased Ventricular Function;Balance Concerns    Cardiac Risk Stratification High             6 Minute Walk:  6 Minute Walk     Row Name 01/16/22 1056         6 Minute Walk   Phase Initial     Distance 1328 feet     Walk Time 6 minutes     # of Rest Breaks 0     MPH 12.28     METS 3.51     RPE 11     Perceived Dyspnea  0     VO2 Peak 12.28     Symptoms Yes (comment)     Comments Left calf pain 2/10     Resting HR 83 bpm     Resting BP 122/72     Resting Oxygen Saturation  98 %     Exercise Oxygen Saturation  during 6 min walk 97 %     Max Ex. HR 91 bpm     Max Ex. BP 126/81     2 Minute Post BP 123/83              Oxygen Initial Assessment:   Oxygen Re-Evaluation:   Oxygen Discharge (Final Oxygen Re-Evaluation):   Initial Exercise Prescription:  Initial Exercise Prescription - 01/16/22 1200  Date of Initial Exercise RX and Referring Provider   Date 01/16/22     Referring Provider [name], MD [name], MD - covering)    Expected Discharge Date 03/14/22      Recumbant Bike   Level 2    Watts 50    Minutes 15    METs 3.5      NuStep   Level 2    SPM 80    Minutes 15    METs 3.5      Prescription Details   Frequency (times per week) 3    Duration Progress to 30 minutes of continuous aerobic without signs/symptoms of physical distress      Intensity   THRR 40-80% of Max Heartrate 69-138    Ratings of Perceived Exertion 11-13    Perceived Dyspnea 0-4      Progression   Progression Continue to progress workloads to maintain intensity without signs/symptoms of physical distress.      Resistance Training   Training Prescription Yes    Weight 5 lbs    Reps 10-15             Perform Capillary Blood Glucose checks as needed.  Exercise Prescription Changes:   Exercise Comments:   Exercise Goals and Review:   Exercise Goals     Row Name 01/16/22 1158             Exercise Goals   Increase Physical Activity Yes       Intervention Provide advice, education, support and counseling about physical activity/exercise needs.;Develop an individualized exercise prescription for aerobic and resistive training based on initial evaluation findings, risk stratification, comorbidities and participant's personal goals.       Expected Outcomes Short Term: Attend rehab on a regular basis to increase amount of physical activity.;Long Term: Add in home exercise to make exercise part of routine and to increase amount of physical activity.;Long Term: Exercising regularly at least 3-5 days a week.       Increase Strength and Stamina Yes       Intervention Provide advice, education, support and counseling about physical activity/exercise needs.;Develop an individualized exercise prescription for aerobic and resistive training based on initial evaluation findings, risk stratification, comorbidities and participant's personal goals.        Expected Outcomes Short Term: Increase workloads from initial exercise prescription for resistance, speed, and METs.;Short Term: Perform resistance training exercises routinely during rehab and add in resistance training at home;Long Term: Improve cardiorespiratory fitness, muscular endurance and strength as measured by increased METs and functional capacity (6MWT)       Able to understand and use rate of perceived exertion (RPE) scale Yes       Intervention Provide education and explanation on how to use RPE scale       Expected Outcomes Short Term: Able to use RPE daily in rehab to express subjective intensity level;Long Term:  Able to use RPE to guide intensity level when exercising independently       Knowledge and understanding of Target Heart Rate Range (THRR) Yes       Intervention Provide education and explanation of THRR including how the numbers were predicted and where they are located for reference       Expected Outcomes Short Term: Able to state/look up THRR;Short Term: Able to use daily as guideline for intensity in rehab;Long Term: Able to use THRR to govern intensity when exercising independently       Understanding of Exercise Prescription  Yes       Intervention Provide education, explanation, and written materials on patient's individual exercise prescription       Expected Outcomes Short Term: Able to explain program exercise prescription;Long Term: Able to explain home exercise prescription to exercise independently                Exercise Goals Re-Evaluation :   Discharge Exercise Prescription (Final Exercise Prescription Changes):   Nutrition:  Target Goals: Understanding of nutrition guidelines, daily intake of sodium <1573m, cholesterol <2079m calories 30% from fat and 7% or less from saturated fats, daily to have 5 or more servings of fruits and vegetables.  Biometrics:  Pre Biometrics - 01/16/22 1000       Pre Biometrics   Waist Circumference 46.5 inches     Hip Circumference 44.25 inches    Waist to Hip Ratio 1.05 %    Triceps Skinfold 18 mm    % Body Fat 34.6 %    Grip Strength 30 kg    Flexibility 0 in   Unable to reach   Single Leg Stand 7.43 seconds              Nutrition Therapy Plan and Nutrition Goals:   Nutrition Assessments:  MEDIFICTS Score Key: ?70 Need to make dietary changes  40-70 Heart Healthy Diet ? 40 Therapeutic Level Cholesterol Diet   Flowsheet Row CARDIAC REHAB PHASE II [facility] Total [facility] on [facility]charge 66      Picture Your Plate Scores: <4<41nhealthy dietary pattern with much room for improvement. 41-50 Dietary pattern unlikely to meet recommendations for good health and room for improvement. 51-60 More healthful dietary pattern, with some room for improvement.  >60 Healthy dietary pattern, although there may be some specific behaviors that could be improved.    Nutrition Goals Re-Evaluation:   Nutrition Goals Re-Evaluation:   Nutrition Goals Discharge (Final Nutrition Goals Re-Evaluation):   Psychosocial: Target Goals: Acknowledge presence or absence of significant depression and/or stress, maximize coping skills, provide positive support system. Participant is able to verbalize types and ability to use techniques and skills needed for reducing stress and depression.  Initial Review & Psychosocial Screening:  Initial Psych Review & Screening - 01/16/22 1206       Initial Review   Current issues with None Identified      Family Dynamics   Good Support System? Yes   LaVincente Libertyas his wife, friend and cousins for support     Barriers   Psychosocial barriers to participate in program There are no identifiable barriers or psychosocial needs.      Screening Interventions   Interventions Encouraged to exercise;Provide feedback about the scores to participant    Expected Outcomes Short Term goal: Identification  and review with participant of any Quality of Life or Depression concerns found by scoring the questionnaire.;Long Term goal: The participant improves quality of Life and PHQ9 Scores as seen by post scores and/or verbalization of changes             Quality of Life Scores:  Quality of Life - 01/16/22 1152       Quality of Life   Select Quality of Life      Quality of Life Scores   Health/Function Pre 21.96 %    Socioeconomic Pre 17.21 %    Psych/Spiritual Pre 21.43 %    Family Pre 21 %    GLOBAL Pre  20.69 %            Scores of 19 and below usually indicate a poorer quality of life in these areas.  A difference of  2-3 points is a clinically meaningful difference.  A difference of 2-3 points in the total score of the Quality of Life Index has been associated with significant improvement in overall quality of life, self-image, physical symptoms, and general health in studies assessing change in quality of life.  PHQ-9: Review Flowsheet  More data exists      01/16/2022 12/24/2020 11/01/2020 08/29/2016 08/18/2016  Depression screen PHQ 2/9  Decreased Interest 0 0 0 0 0  Down, Depressed, Hopeless 0 0 0 0 0  PHQ - 2 Score 0 0 0 0 0   Interpretation of Total Score  Total Score Depression Severity:  1-4 = Minimal depression, 5-9 = Mild depression, 10-14 = Moderate depression, 15-19 = Moderately severe depression, 20-27 = Severe depression   Psychosocial Evaluation and Intervention:   Psychosocial Re-Evaluation:   Psychosocial Discharge (Final Psychosocial Re-Evaluation):   Vocational Rehabilitation: Provide vocational rehab assistance to qualifying candidates.   Vocational Rehab Evaluation & Intervention:  Vocational Rehab - 01/16/22 1209       Initial Vocational Rehab Evaluation & Intervention   Assessment shows need for Vocational Rehabilitation No   [name] works full time and does not need vocational rehab at this time            Education: Education Goals:  Education classes will be provided on a weekly basis, covering required topics. Participant will state understanding/return demonstration of topics presented.     Core Videos: Exercise    Move It!  Clinical staff conducted group or individual video education with verbal and written material and guidebook.  Patient learns the recommended Pritikin exercise program. Exercise with the goal of living a long, healthy life. Some of the health benefits of exercise include controlled diabetes, healthier blood pressure levels, improved cholesterol levels, improved heart and lung capacity, improved sleep, and better body composition. Everyone should speak with their doctor before starting or changing an exercise routine.  Biomechanical Limitations Clinical staff conducted group or individual video education with verbal and written material and guidebook.  Patient learns how biomechanical limitations can impact exercise and how we can mitigate and possibly overcome limitations to have an impactful and balanced exercise routine.  Body Composition Clinical staff conducted group or individual video education with verbal and written material and guidebook.  Patient learns that body composition (ratio of muscle mass to fat mass) is a key component to assessing overall fitness, rather than body weight alone. Increased fat mass, especially visceral belly fat, can put [place] at increased risk for metabolic syndrome, type 2 diabetes, heart disease, and even death. It is recommended to combine diet and exercise (cardiovascular and resistance training) to improve your body composition. Seek guidance from your physician and exercise physiologist before implementing an exercise routine.  Exercise Action Plan Clinical staff conducted group or individual video education with verbal and written material and guidebook.  Patient learns the recommended strategies to achieve and enjoy long-term exercise adherence, including  variety, self-motivation, self-efficacy, and positive decision making. Benefits of exercise include fitness, good health, weight management, more energy, better sleep, less stress, and overall well-being.  Medical   Heart Disease Risk Reduction Clinical staff conducted group or individual video education with verbal and written material and guidebook.  Patient learns our heart is our most vital organ as it circulates  oxygen, nutrients, white blood cells, and hormones throughout the entire body, and carries waste away. Data supports a plant-based eating plan like the Pritikin Program for its effectiveness in slowing progression of and reversing heart disease. The video provides a number of recommendations to address heart disease.   Metabolic Syndrome and Belly Fat  Clinical staff conducted group or individual video education with verbal and written material and guidebook.  Patient learns what metabolic syndrome is, how it leads to heart disease, and how one can reverse it and keep it from coming back. You have metabolic syndrome if you have 3 of the following 5 criteria: abdominal obesity, high blood pressure, high triglycerides, low HDL cholesterol, and high blood sugar.  Hypertension and Heart Disease Clinical staff conducted group or individual video education with verbal and written material and guidebook.  Patient learns that high blood pressure, or hypertension, is very common in the [place]. Hypertension is largely due to excessive salt intake, but other important risk factors include being overweight, physical inactivity, drinking too much alcohol, smoking, and not eating enough potassium from fruits and vegetables. High blood pressure is a leading risk factor for heart attack, stroke, congestive heart failure, dementia, kidney failure, and premature death. Long-term effects of excessive salt intake include stiffening of the arteries and thickening of heart muscle and organ damage.  Recommendations include ways to reduce hypertension and the risk of heart disease.  Diseases of Our Time - Focusing on Diabetes Clinical staff conducted group or individual video education with verbal and written material and guidebook.  Patient learns why the best way to stop diseases of our time is prevention, through food and other lifestyle changes. Medicine (such as prescription pills and surgeries) is often only a Band-Aid on the problem, not a long-term solution. Most common diseases of our time include obesity, type 2 diabetes, hypertension, heart disease, and cancer. The Pritikin Program is recommended and has been proven to help reduce, reverse, and/or prevent the damaging effects of metabolic syndrome.  Nutrition   Overview of the Pritikin Eating Plan  Clinical staff conducted group or individual video education with verbal and written material and guidebook.  Patient learns about the [place] for disease risk reduction. The [place] emphasizes a wide variety of unrefined, minimally-processed carbohydrates, like fruits, vegetables, whole grains, and legumes. Go, Caution, and Stop food choices are explained. Plant-based and lean animal proteins are emphasized. Rationale provided for low sodium intake for blood pressure control, low added sugars for blood sugar stabilization, and low added fats and oils for coronary artery disease risk reduction and weight management.  Calorie Density  Clinical staff conducted group or individual video education with verbal and written material and guidebook.  Patient learns about calorie density and how it impacts the Pritikin Eating Plan. Knowing the characteristics of the food you choose will help you decide whether those foods will lead to weight gain or weight loss, and whether you want to consume more or less of them. Weight loss is usually a side effect of the Pritikin Eating Plan because of its focus on low calorie-dense  foods.  Label Reading  Clinical staff conducted group or individual video education with verbal and written material and guidebook.  Patient learns about the Pritikin recommended label reading guidelines and corresponding recommendations regarding calorie density, added sugars, sodium content, and whole grains.  Dining Out - Part 1  Clinical staff conducted group or individual video education with verbal and written material and guidebook.  Patient learns that restaurant meals can be sabotaging because they can be so high in calories, fat, sodium, and/or sugar. Patient learns recommended strategies on how to positively address this and avoid unhealthy pitfalls.  Facts on Fats  Clinical staff conducted group or individual video education with verbal and written material and guidebook.  Patient learns that lifestyle modifications can be just as effective, if not more so, as many medications for lowering your risk of heart disease. A Pritikin lifestyle can help to reduce your risk of inflammation and atherosclerosis (cholesterol build-up, or plaque, in the artery walls). Lifestyle interventions such as dietary choices and physical activity address the cause of atherosclerosis. A review of the types of fats and their impact on blood cholesterol levels, along with dietary recommendations to reduce fat intake is also included.  Nutrition Action Plan  Clinical staff conducted group or individual video education with verbal and written material and guidebook.  Patient learns how to incorporate Pritikin recommendations into their lifestyle. Recommendations include planning and keeping personal health goals in mind as an important part of their success.  Healthy Mind-Set    Healthy Minds, Bodies, Hearts  Clinical staff conducted group or individual video education with verbal and written material and guidebook.  Patient learns how to identify when they are stressed. Video will discuss the impact of that  stress, as well as the many benefits of stress management. Patient will also be introduced to stress management techniques. The way we think, act, and feel has an impact on our hearts.  How Our Thoughts Can Heal Our Hearts  Clinical staff conducted group or individual video education with verbal and written material and guidebook.  Patient learns that negative thoughts can cause depression and anxiety. This can result in negative lifestyle behavior and serious health problems. Cognitive behavioral therapy is an effective method to help control our thoughts in order to change and improve our emotional outlook.  Additional Videos:  Exercise    Improving Performance  Clinical staff conducted group or individual video education with verbal and written material and guidebook.  Patient learns to use a non-linear approach by alternating intensity levels and lengths of time spent exercising to help burn more calories and lose more body fat. Cardiovascular exercise helps improve heart health, metabolism, hormonal balance, blood sugar control, and recovery from fatigue. Resistance training improves strength, endurance, balance, coordination, reaction time, metabolism, and muscle mass. Flexibility exercise improves circulation, posture, and balance. Seek guidance from your physician and exercise physiologist before implementing an exercise routine and learn your capabilities and proper form for all exercise.  Introduction to Yoga  Clinical staff conducted group or individual video education with verbal and written material and guidebook.  Patient learns about yoga, a discipline of the coming together of mind, breath, and body. The benefits of yoga include improved flexibility, improved range of motion, better posture and core strength, increased lung function, weight loss, and positive self-image. Yoga's heart health benefits include lowered blood pressure, healthier heart rate, decreased cholesterol and  triglyceride levels, improved immune function, and reduced stress. Seek guidance from your physician and exercise physiologist before implementing an exercise routine and learn your capabilities and proper form for all exercise.  Medical   Aging: Enhancing Your Quality of Life  Clinical staff conducted group or individual video education with verbal and written material and guidebook.  Patient learns key strategies and recommendations to stay in good physical health and enhance quality of life, such as prevention strategies, having an advocate,  securing a Health Care Proxy and Power of Attorney, and keeping a list of medications and system for tracking them. It also discusses how to avoid risk for bone loss.  Biology of Weight Control  Clinical staff conducted group or individual video education with verbal and written material and guidebook.  Patient learns that weight gain occurs because we consume more calories than we burn (eating more, moving less). Even if your body weight is normal, you may have higher ratios of fat compared to muscle mass. Too much body fat puts you at increased risk for cardiovascular disease, heart attack, stroke, type 2 diabetes, and obesity-related cancers. In addition to exercise, following the [place] can help reduce your risk.  Decoding Lab Results  Clinical staff conducted group or individual video education with verbal and written material and guidebook.  Patient learns that lab test reflects one measurement whose values change over time and are influenced by many factors, including medication, stress, sleep, exercise, food, hydration, pre-existing medical conditions, and more. It is recommended to use the knowledge from this video to become more involved with your lab results and evaluate your numbers to speak with your doctor.   Diseases of Our Time - Overview  Clinical staff conducted group or individual video education with verbal and written  material and guidebook.  Patient learns that according to the CDC, 50% to 70% of chronic diseases (such as obesity, type 2 diabetes, elevated lipids, hypertension, and heart disease) are avoidable through lifestyle improvements including healthier food choices, listening to satiety cues, and increased physical activity.  Sleep Disorders Clinical staff conducted group or individual video education with verbal and written material and guidebook.  Patient learns how good quality and duration of sleep are important to overall health and well-being. Patient also learns about sleep disorders and how they impact health along with recommendations to address them, including discussing with a physician.  Nutrition  Dining Out - Part 2 Clinical staff conducted group or individual video education with verbal and written material and guidebook.  Patient learns how to plan ahead and communicate in order to maximize their dining experience in a healthy and nutritious manner. Included are recommended food choices based on the type of restaurant the patient is visiting.   Fueling a Best boy conducted group or individual video education with verbal and written material and guidebook.  There is a strong connection between our food choices and our health. Diseases like obesity and type 2 diabetes are very prevalent and are in large-part due to lifestyle choices. The Pritikin Eating Plan provides plenty of food and hunger-curbing satisfaction. It is easy to follow, affordable, and helps reduce health risks.  Menu Workshop  Clinical staff conducted group or individual video education with verbal and written material and guidebook.  Patient learns that restaurant meals can sabotage health goals because they are often packed with calories, fat, sodium, and sugar. Recommendations include strategies to plan ahead and to communicate with the manager, chef, or server to help order a healthier  meal.  Planning Your Eating Strategy  Clinical staff conducted group or individual video education with verbal and written material and guidebook.  Patient learns about the [place] and its benefit of reducing the risk of disease. The [place] does not focus on calories. Instead, it emphasizes high-quality, nutrient-rich foods. By knowing the characteristics of the foods, we choose, we can determine their calorie density and make informed decisions.  Targeting Your Nutrition  Priorities  Clinical staff conducted group or individual video education with verbal and written material and guidebook.  Patient learns that lifestyle habits have a tremendous impact on disease risk and progression. This video provides eating and physical activity recommendations based on your personal health goals, such as reducing LDL cholesterol, losing weight, preventing or controlling type 2 diabetes, and reducing high blood pressure.  Vitamins and Minerals  Clinical staff conducted group or individual video education with verbal and written material and guidebook.  Patient learns different ways to obtain key vitamins and minerals, including through a recommended healthy diet. It is important to discuss all supplements you take with your doctor.   Healthy Mind-Set    Smoking Cessation  Clinical staff conducted group or individual video education with verbal and written material and guidebook.  Patient learns that cigarette smoking and tobacco addiction pose a serious health risk which affects millions of people. Stopping smoking will significantly reduce the risk of heart disease, lung disease, and many forms of cancer. Recommended strategies for quitting are covered, including working with your doctor to develop a successful plan.  Culinary   Becoming a Financial trader conducted group or individual video education with verbal and written material and guidebook.  Patient learns  that cooking at home can be healthy, cost-effective, quick, and puts them in control. Keys to cooking healthy recipes will include looking at your recipe, assessing your equipment needs, planning ahead, making it simple, choosing cost-effective seasonal ingredients, and limiting the use of added fats, salts, and sugars.  Cooking - Breakfast and Snacks  Clinical staff conducted group or individual video education with verbal and written material and guidebook.  Patient learns how important breakfast is to satiety and nutrition through the entire day. Recommendations include key foods to eat during breakfast to help stabilize blood sugar levels and to prevent overeating at meals later in the day. Planning ahead is also a key component.  Cooking - Human resources officer conducted group or individual video education with verbal and written material and guidebook.  Patient learns eating strategies to improve overall health, including an approach to cook more at home. Recommendations include thinking of animal protein as a side on your plate rather than center stage and focusing instead on lower calorie dense options like vegetables, fruits, whole grains, and plant-based proteins, such as beans. Making sauces in large quantities to freeze for later and leaving the skin on your vegetables are also recommended to maximize your experience.  Cooking - Healthy Salads and Dressing Clinical staff conducted group or individual video education with verbal and written material and guidebook.  Patient learns that vegetables, fruits, whole grains, and legumes are the foundations of the [place]. Recommendations include how to incorporate each of these in flavorful and healthy salads, and how to create homemade salad dressings. Proper handling of ingredients is also covered. Cooking - Soups and [employer] - Soups and Desserts Clinical staff conducted group or individual video education with  verbal and written material and guidebook.  Patient learns that Pritikin soups and desserts make for easy, nutritious, and delicious snacks and meal components that are low in sodium, fat, sugar, and calorie density, while high in vitamins, minerals, and filling fiber. Recommendations include simple and healthy ideas for soups and desserts.   Overview     The Pritikin Solution Program Overview Clinical staff conducted group or individual video education with verbal and written material and guidebook.  Patient  learns that the results of the Pritikin Program have been documented in more than 100 articles published in peer-reviewed journals, and the benefits include reducing risk factors for (and, in some cases, even reversing) high cholesterol, high blood pressure, type 2 diabetes, obesity, and more! An overview of the three key pillars of the Pritikin Program will be covered: eating well, doing regular exercise, and having a healthy mind-set.  WORKSHOPS  Exercise: Exercise Basics: Building Your Action Plan Clinical staff led group instruction and group discussion with PowerPoint presentation and patient guidebook. To enhance the learning environment the use of posters, models and videos may be added. At the conclusion of this workshop, patients will comprehend the difference between physical activity and exercise, as well as the benefits of incorporating both, into their routine. Patients will understand the FITT (Frequency, Intensity, Time, and Type) principle and how to use it to build an exercise action plan. In addition, safety concerns and other considerations for exercise and cardiac rehab will be addressed by the presenter. The purpose of this lesson is to promote a comprehensive and effective weekly exercise routine in order to improve patients' overall level of fitness.   Managing Heart Disease: Your Path to a Healthier Heart Clinical staff led group instruction and group discussion with  PowerPoint presentation and patient guidebook. To enhance the learning environment the use of posters, models and videos may be added.At the conclusion of this workshop, patients will understand the anatomy and physiology of the heart. Additionally, they will understand how Pritikin's three pillars impact the risk factors, the progression, and the management of heart disease.  The purpose of this lesson is to provide a high-level overview of the heart, heart disease, and how the Pritikin lifestyle positively impacts risk factors.  Exercise Biomechanics Clinical staff led group instruction and group discussion with PowerPoint presentation and patient guidebook. To enhance the learning environment the use of posters, models and videos may be added. Patients will learn how the structural parts of their bodies function and how these functions impact their daily activities, movement, and exercise. Patients will learn how to promote a neutral spine, learn how to manage pain, and identify ways to improve their physical movement in order to promote healthy living. The purpose of this lesson is to expose patients to common physical limitations that impact physical activity. Participants will learn practical ways to adapt and manage aches and pains, and to minimize their effect on regular exercise. Patients will learn how to maintain good posture while sitting, walking, and lifting.  Balance Training and Fall Prevention  Clinical staff led group instruction and group discussion with PowerPoint presentation and patient guidebook. To enhance the learning environment the use of posters, models and videos may be added. At the conclusion of this workshop, patients will understand the importance of their sensorimotor skills (vision, proprioception, and the vestibular system) in maintaining their ability to balance as they age. Patients will apply a variety of balancing exercises that are appropriate for their  current level of function. Patients will understand the common causes for poor balance, possible solutions to these problems, and ways to modify their physical environment in order to minimize their fall risk. The purpose of this lesson is to teach patients about the importance of maintaining balance as they age and ways to minimize their risk of falling.  WORKSHOPS   Nutrition:  Fueling a Scientist, research (physical sciences) led group instruction and group discussion with PowerPoint presentation and patient guidebook. To enhance the learning environment  the use of posters, models and videos may be added. Patients will review the foundational principles of the [place] and understand what constitutes a serving size in each of the food groups. Patients will also learn Pritikin-friendly foods that are better choices when away from home and review make-ahead meal and snack options. Calorie density will be reviewed and applied to three nutrition priorities: weight maintenance, weight loss, and weight gain. The purpose of this lesson is to reinforce (in a group setting) the key concepts around what patients are recommended to eat and how to apply these guidelines when away from home by planning and selecting Pritikin-friendly options. Patients will understand how calorie density may be adjusted for different weight management goals.  Mindful Eating  Clinical staff led group instruction and group discussion with PowerPoint presentation and patient guidebook. To enhance the learning environment the use of posters, models and videos may be added. Patients will briefly review the concepts of the [place] and the importance of low-calorie dense foods. The concept of mindful eating will be introduced as well as the importance of paying attention to internal hunger signals. Triggers for non-hunger eating and techniques for dealing with triggers will be explored. The purpose of this lesson is to provide  patients with the opportunity to review the basic principles of the [place], discuss the value of eating mindfully and how to measure internal cues of hunger and fullness using the Hunger Scale. Patients will also discuss reasons for non-hunger eating and learn strategies to use for controlling emotional eating.  Targeting Your Nutrition Priorities Clinical staff led group instruction and group discussion with PowerPoint presentation and patient guidebook. To enhance the learning environment the use of posters, models and videos may be added. Patients will learn how to determine their genetic susceptibility to disease by reviewing their family history. Patients will gain insight into the importance of diet as part of an overall healthy lifestyle in mitigating the impact of genetics and other environmental insults. The purpose of this lesson is to provide patients with the opportunity to assess their personal nutrition priorities by looking at their family history, their own health history and current risk factors. Patients will also be able to discuss ways of prioritizing and modifying the [place] for their highest risk areas  Menu  Clinical staff led group instruction and group discussion with PowerPoint presentation and patient guidebook. To enhance the learning environment the use of posters, models and videos may be added. Using menus brought in from [employer], or printed from [employer], patients will apply the [place] dining out guidelines that were presented in the [employer] video. Patients will also be able to practice these guidelines in a variety of provided scenarios. The purpose of this lesson is to provide patients with the opportunity to practice hands-on learning of the [place] with actual menus and practice scenarios.  Label Reading Clinical staff led group instruction and group discussion with PowerPoint  presentation and patient guidebook. To enhance the learning environment the use of posters, models and videos may be added. Patients will review and discuss the Pritikin label reading guidelines presented in Pritikin's Label Reading Educational series video. Using fool labels brought in from local grocery stores and markets, patients will apply the label reading guidelines and determine if the packaged food meet the Pritikin guidelines. The purpose of this lesson is to provide patients with the opportunity to review, discuss, and practice hands-on  learning of the Pritikin Label Reading guidelines with actual packaged food labels. [place] Workshops are designed to teach patients ways to prepare quick, simple, and affordable recipes at home. The importance of nutrition's role in chronic disease risk reduction is reflected in its emphasis in the overall Pritikin program. By learning how to prepare essential core Pritikin Eating Plan recipes, patients will increase control over what they eat; be able to customize the flavor of foods without the use of added salt, sugar, or fat; and improve the quality of the food they consume. By learning a set of core recipes which are easily assembled, quickly prepared, and affordable, patients are more likely to prepare more healthy foods at home. These workshops focus on convenient breakfasts, simple entres, side dishes, and desserts which can be prepared with minimal effort and are consistent with nutrition recommendations for cardiovascular risk reduction. Cooking [employer] are taught by a Engineer, materials (RD) who has been trained by the [employer]. The chef or RD has a clear understanding of the importance of minimizing - if not completely eliminating - added fat, sugar, and sodium in recipes. Throughout the series of [place] Workshop sessions, patients will learn about healthy ingredients and  efficient methods of cooking to build confidence in their capability to prepare    Cooking School weekly topics:  Adding Flavor- Sodium-Free  Fast and Healthy Breakfasts  Powerhouse Plant-Based Proteins  Satisfying Salads and Dressings  Simple Sides and Sauces  International Cuisine-Spotlight on the [place] Zones  Delicious Desserts  Savory Soups  Efficiency Cooking - Meals in a Snap  Tasty Appetizers and Snacks  Comforting Weekend Breakfasts  One-Pot Wonders   Fast Evening Meals  Easy [place] (Psychosocial): New Thoughts, New Behaviors Clinical staff led group instruction and group discussion with PowerPoint presentation and patient guidebook. To enhance the learning environment the use of posters, models and videos may be added. Patients will learn and practice techniques for developing effective health and lifestyle goals. Patients will be able to effectively apply the goal setting process learned to develop at least one new personal goal.  The purpose of this lesson is to expose patients to a new skill set of behavior modification techniques such as techniques setting SMART goals, overcoming barriers, and achieving new thoughts and new behaviors.  Managing Moods and Relationships Clinical staff led group instruction and group discussion with PowerPoint presentation and patient guidebook. To enhance the learning environment the use of posters, models and videos may be added. Patients will learn how emotional and chronic stress factors can impact their health and relationships. They will learn healthy ways to manage their moods and utilize positive coping mechanisms. In addition, ICR patients will learn ways to improve communication skills. The purpose of this lesson is to expose patients to ways of understanding how one's mood and health are intimately connected. Developing a healthy outlook can help build positive  relationships and connections with others. Patients will understand the importance of utilizing effective communication skills that include actively listening and being heard. They will learn and understand the importance of the "4 Cs" and especially Connections in fostering of a Healthy Mind-Set.  Healthy Sleep for a Healthy Heart Clinical staff led group instruction and group discussion with PowerPoint presentation and patient guidebook. To enhance the learning environment the use of posters, models and videos may be added. At the conclusion of this  workshop, patients will be able to demonstrate knowledge of the importance of sleep to overall health, well-being, and quality of life. They will understand the symptoms of, and treatments for, common sleep disorders. Patients will also be able to identify daytime and nighttime behaviors which impact sleep, and they will be able to apply these tools to help manage sleep-related challenges. The purpose of this lesson is to provide patients with a general overview of sleep and outline the importance of quality sleep. Patients will learn about a few of the most common sleep disorders. Patients will also be introduced to the concept of "sleep hygiene," and discover ways to self-manage certain sleeping problems through simple daily behavior changes. Finally, the workshop will motivate patients by clarifying the links between quality sleep and their goals of heart-healthy living.   Recognizing and Reducing Stress Clinical staff led group instruction and group discussion with PowerPoint presentation and patient guidebook. To enhance the learning environment the use of posters, models and videos may be added. At the conclusion of this workshop, patients will be able to understand the types of stress reactions, differentiate between acute and chronic stress, and recognize the impact that chronic stress has on their health. They will also be able to apply different coping  mechanisms, such as reframing negative self-talk. Patients will have the opportunity to practice a variety of stress management techniques, such as deep abdominal breathing, progressive muscle relaxation, and/or guided imagery.  The purpose of this lesson is to educate patients on the role of stress in their lives and to provide healthy techniques for coping with it.  Learning Barriers/Preferences:  Learning Barriers/Preferences - 01/16/22 1155       Learning Barriers/Preferences   Learning Barriers Sight   wears glasses   Learning Preferences Skilled Demonstration;Individual Instruction;Group Instruction             Education Topics:  Knowledge Questionnaire Score:  Knowledge Questionnaire Score - 01/16/22 1153       Knowledge Questionnaire Score   Pre Score 23/24             Core Components/Risk Factors/Patient Goals at Admission:  Personal Goals and Risk Factors at Admission - 01/16/22 1153       Core Components/Risk Factors/Patient Goals on Admission    Weight Management Yes;Obesity;Weight Loss    Intervention Weight Management: Develop a combined nutrition and exercise program designed to reach desired caloric intake, while maintaining appropriate intake of nutrient and fiber, sodium and fats, and appropriate energy expenditure required for the weight goal.;Weight Management: Provide education and appropriate resources to help participant work on and attain dietary goals.;Weight Management/Obesity: Establish reasonable short term and long term weight goals.;Obesity: Provide education and appropriate resources to help participant work on and attain dietary goals.    Admit Weight 235 lb 3.7 oz (106.7 kg)    Expected Outcomes Short Term: Continue to assess and modify interventions until short term weight is achieved;Long Term: Adherence to nutrition and physical activity/exercise program aimed toward attainment of established weight goal;Weight Maintenance: Understanding of  the daily nutrition guidelines, which includes 25-35% calories from fat, 7% or less cal from saturated fats, less than $RemoveB'200mg'ToVSnLxy$  cholesterol, less than 1.5gm of sodium, & 5 or more servings of fruits and vegetables daily;Weight Loss: Understanding of general recommendations for a balanced deficit meal plan, which promotes 1-2 lb weight loss per week and includes a negative energy balance of [id number] kcal/d;Understanding recommendations for meals to include 15-35% energy as protein, 25-35% energy from fat, 35-60%  energy from carbohydrates, less than 27m of dietary cholesterol, 20-35 gm of total fiber daily;Understanding of distribution of calorie intake throughout the day with the consumption of 4-5 meals/snacks    Diabetes Yes    Intervention Provide education about signs/symptoms and action to take for hypo/hyperglycemia.;Provide education about proper nutrition, including hydration, and aerobic/resistive exercise prescription along with prescribed medications to achieve blood glucose in normal ranges: Fasting glucose 65-99 mg/dL    Expected Outcomes Short Term: Participant verbalizes understanding of the signs/symptoms and immediate care of hyper/hypoglycemia, proper foot care and importance of medication, aerobic/resistive exercise and nutrition plan for blood glucose control.;Long Term: Attainment of HbA1C < 7%.    Heart Failure Yes    Intervention Provide a combined exercise and nutrition program that is supplemented with education, support and counseling about heart failure. Directed toward relieving symptoms such as shortness of breath, decreased exercise tolerance, and extremity edema.    Expected Outcomes Improve functional capacity of life;Short term: Attendance in program 2-3 days a week with increased exercise capacity. Reported lower sodium intake. Reported increased fruit and vegetable intake. Reports medication compliance.;Short term: Daily weights obtained and reported for increase. Utilizing  diuretic protocols set by physician.;Long term: Adoption of self-care skills and reduction of barriers for early signs and symptoms recognition and intervention leading to self-care maintenance.    Hypertension Yes    Intervention Provide education on lifestyle modifcations including regular physical activity/exercise, weight management, moderate sodium restriction and increased consumption of fresh fruit, vegetables, and low fat dairy, alcohol moderation, and smoking cessation.;Monitor prescription use compliance.    Expected Outcomes Short Term: Continued assessment and intervention until BP is < 140/9109mHG in hypertensive participants. < 130/8059mG in hypertensive participants with diabetes, heart failure or chronic kidney disease.;Long Term: Maintenance of blood pressure at goal levels.    Lipids Yes    Intervention Provide education and support for participant on nutrition & aerobic/resistive exercise along with prescribed medications to achieve LDL <86m27mDL >40mg58m Expected Outcomes Short Term: Participant states understanding of desired cholesterol values and is compliant with medications prescribed. Participant is following exercise prescription and nutrition guidelines.;Long Term: Cholesterol controlled with medications as prescribed, with individualized exercise RX and with personalized nutrition plan. Value goals: LDL < 86mg,38m > 40 mg.    Stress Yes    Intervention Offer individual and/or small group education and counseling on adjustment to heart disease, stress management and health-related lifestyle change. Teach and support self-help strategies.;Refer participants experiencing significant psychosocial distress to appropriate mental health specialists for further evaluation and treatment. When possible, include family members and significant others in education/counseling sessions.    Expected Outcomes Short Term: Participant demonstrates changes in health-related behavior, relaxation  and other stress management skills, ability to obtain effective social support, and compliance with psychotropic medications if prescribed.;Long Term: Emotional wellbeing is indicated by absence of clinically significant psychosocial distress or social isolation.             Core Components/Risk Factors/Patient Goals Review:    Core Components/Risk Factors/Patient Goals at Discharge (Final Review):    ITP Comments:  ITP Comments     Row Name 01/16/22 1055 01/22/22 1232         ITP Comments Dr Traci Fransico Himedical Director. Introduction to [facility]/ Intensive Cardiac Rehab. Initial Orientation Packet reviewed with the patient 30 day ITP Review. LamontVincente Libertyheduled to begin exercise at intensive cardiac rehab on Friday 01/24/22.  Comments: See ITP comments.Harrell Gave RN BSN

## 2022-01-24 ENCOUNTER — Encounter (HOSPITAL_COMMUNITY)
Admission: RE | Admit: 2022-01-24 | Discharge: 2022-01-24 | Disposition: A | Discharge status: Home / Self Care | Payer: Medicare Other | Source: Ambulatory Visit | Attending: Cardiology | Admitting: Cardiology

## 2022-01-24 DIAGNOSIS — Z955 Presence of coronary angioplasty implant and graft: Secondary | ICD-10-CM | POA: Diagnosis not present

## 2022-01-24 LAB — GLUCOSE, CAPILLARY
Glucose-Capillary: 181 mg/dL — ABNORMAL HIGH (ref 70–99)
Glucose-Capillary: 169 mg/dL — ABNORMAL HIGH (ref 70–99)

## 2022-01-24 NOTE — Progress Notes (Signed)
Daily Session Note  Patient Details  Name: [name] MRN: [id number] Date of Birth: [birth year] Referring Provider:   Flowsheet Row INTENSIVE CARDIAC REHAB ORIENT from 01/16/2022 in [place]  Referring Provider Kenneth Ned, MD Kenneth Him, MD - covering)       Encounter Date: 01/24/2022  Check In:  Session Check In - 01/24/22 1251       Check-In   Supervising physician immediately available to respond to emergencies [facility] - Physician supervision    Physician(s) Dr. Harrington Challenger    Location [facility]    Staff Present Barnet Pall, RN, Milus Glazier, MS, ACSM-CEP, CCRP, Exercise Physiologist;Bailey Pearline Cables, MS, Exercise Physiologist;Jetta Walker BS, ACSM-CEP, Exercise Physiologist    Virtual Visit No    Medication changes reported     No    Fall or balance concerns reported    No    Tobacco Cessation Use Increase    Current number of cigarettes/nicotine per day     0    Warm-up and Cool-down Not performed (comment)   cardiac rehab orientation   Resistance Training Performed Yes    VAD Patient? No    PAD/SET Patient? No      Pain Assessment   Currently in Pain? No/denies    Pain Score 0-No pain    Multiple Pain Sites No             Capillary Blood Glucose: Results for orders placed or performed during the hospital encounter of 01/24/22 (from the past 24 hour(s))  Glucose, capillary     Status: Abnormal   Collection Time: 01/24/22  1:35 PM  Result Value Ref Range   Glucose-Capillary 169 (H) 70 - 99 mg/dL     Exercise Prescription Changes - 01/24/22 1400       Response to Exercise   Blood Pressure (Admit) 110/80    Blood Pressure (Exercise) 112/78    Blood Pressure (Exit) 120/68    Heart Rate (Admit) 94 bpm    Heart Rate (Exercise) 106 bpm    Heart Rate (Exit) 93 bpm    Rating of Perceived Exertion (Exercise) 12    Symptoms Fatigue after dialysis today    Comments Pt's first day in the [facility] program     Duration Progress to 30 minutes of  aerobic without signs/symptoms of physical distress    Intensity THRR unchanged      Progression   Progression Continue to progress workloads to maintain intensity without signs/symptoms of physical distress.    Average METs 1.6      Resistance Training   Training Prescription Yes    Weight 3 lbs    Reps 10-15    Time 10 Minutes      Interval Training   Interval Training No      Recumbant Bike   Level 2    Minutes 5    METs 1.5      NuStep   Level 1    SPM 84    Minutes 15    METs 1.7             Social History   Tobacco Use  Smoking Status Former   Types: Cigarettes   Quit date: 09/17/2008   Years since quitting: 13.3  Smokeless Tobacco Never    Goals Met:  Exercise tolerated well No report of concerns or symptoms today Strength training completed today  Goals Unmet:  Not Applicable  Comments: [name] started cardiac rehab today.  Pt tolerated light exercise without difficulty. VSS, telemetry-Sinus Rhythm t wave inversion, asymptomatic.  Medication list reconciled. Pt denies barriers to medicaiton compliance.  PSYCHOSOCIAL ASSESSMENT:  PHQ-0. Pt exhibits positive coping skills, hopeful outlook with supportive family. No psychosocial needs identified at this time, no psychosocial interventions necessary.    Pt enjoys church, video games and shooting pool.   Pt oriented to exercise equipment and routine.    Understanding verbalized.Barnet Pall, RN,BSN 01/24/2022 4:45 PM     Dr. Fransico Hall is Medical Director for Cardiac Rehab at [facility].

## 2022-01-27 ENCOUNTER — Encounter (HOSPITAL_COMMUNITY): Payer: Medicare Other

## 2022-01-29 ENCOUNTER — Encounter (HOSPITAL_COMMUNITY)
Admission: RE | Admit: 2022-01-29 | Discharge: 2022-01-29 | Disposition: A | Discharge status: Home / Self Care | Payer: Medicare Other | Source: Ambulatory Visit | Attending: Cardiology | Admitting: Cardiology

## 2022-01-29 DIAGNOSIS — Z955 Presence of coronary angioplasty implant and graft: Secondary | ICD-10-CM | POA: Diagnosis not present

## 2022-01-29 LAB — GLUCOSE, CAPILLARY
Glucose-Capillary: 100 mg/dL — ABNORMAL HIGH (ref 70–99)
Glucose-Capillary: 93 mg/dL (ref 70–99)
Glucose-Capillary: 132 mg/dL — ABNORMAL HIGH (ref 70–99)

## 2022-01-31 ENCOUNTER — Encounter (HOSPITAL_COMMUNITY): Payer: Medicare Other

## 2022-02-03 ENCOUNTER — Encounter (HOSPITAL_COMMUNITY): Payer: Medicare Other

## 2022-02-03 ENCOUNTER — Encounter (HOSPITAL_COMMUNITY)
Admission: RE | Admit: 2022-02-03 | Discharge: 2022-02-03 | Disposition: A | Discharge status: Home / Self Care | Payer: Medicare Other | Source: Ambulatory Visit | Attending: Cardiology | Admitting: Cardiology

## 2022-02-03 DIAGNOSIS — Z955 Presence of coronary angioplasty implant and graft: Secondary | ICD-10-CM | POA: Diagnosis not present

## 2022-02-03 LAB — GLUCOSE, CAPILLARY: Glucose-Capillary: 195 mg/dL — ABNORMAL HIGH (ref 70–99)

## 2022-02-05 ENCOUNTER — Encounter (HOSPITAL_COMMUNITY)
Admission: RE | Admit: 2022-02-05 | Discharge: 2022-02-05 | Disposition: A | Discharge status: Home / Self Care | Payer: Medicare Other | Source: Ambulatory Visit | Attending: Cardiology | Admitting: Cardiology

## 2022-02-05 DIAGNOSIS — Z955 Presence of coronary angioplasty implant and graft: Secondary | ICD-10-CM

## 2022-02-05 DIAGNOSIS — I2089 Other forms of angina pectoris: Secondary | ICD-10-CM

## 2022-02-05 LAB — GLUCOSE, CAPILLARY: Glucose-Capillary: 204 mg/dL — ABNORMAL HIGH (ref 70–99)

## 2022-02-07 ENCOUNTER — Encounter (HOSPITAL_COMMUNITY): Payer: Medicare Other

## 2022-02-10 ENCOUNTER — Encounter (HOSPITAL_COMMUNITY): Payer: Medicare Other

## 2022-02-12 ENCOUNTER — Encounter (HOSPITAL_COMMUNITY)
Admission: RE | Admit: 2022-02-12 | Discharge: 2022-02-12 | Disposition: A | Discharge status: Home / Self Care | Payer: Medicare Other | Source: Ambulatory Visit | Attending: Cardiology | Admitting: Cardiology

## 2022-02-12 DIAGNOSIS — Z955 Presence of coronary angioplasty implant and graft: Secondary | ICD-10-CM | POA: Insufficient documentation

## 2022-02-12 DIAGNOSIS — I2089 Other forms of angina pectoris: Secondary | ICD-10-CM | POA: Diagnosis not present

## 2022-02-14 ENCOUNTER — Encounter (HOSPITAL_COMMUNITY)
Admission: RE | Admit: 2022-02-14 | Discharge: 2022-02-14 | Disposition: A | Discharge status: Home / Self Care | Payer: Medicare Other | Source: Ambulatory Visit | Attending: Cardiology | Admitting: Cardiology

## 2022-02-14 DIAGNOSIS — I2089 Other forms of angina pectoris: Secondary | ICD-10-CM

## 2022-02-14 DIAGNOSIS — Z955 Presence of coronary angioplasty implant and graft: Secondary | ICD-10-CM | POA: Diagnosis not present

## 2022-02-14 LAB — GLUCOSE, CAPILLARY: Glucose-Capillary: 128 mg/dL — ABNORMAL HIGH (ref 70–99)

## 2022-02-14 NOTE — Progress Notes (Signed)
[name] in today for cardiac rehab.  Pt reports feeling tired.  Pt went to dialysis this morning did not eat prior to HD nor coming to exercise.  [name] indicated that he did not have time enough to change clothes and eat.  [name] did not take any of his medications including his DM medications.  BP within normal limits.  Checked CBG 128. Pt given two packs of graham crackers and water to have prior to exercise.  [name] was advised that he will be allowed to exercise at his own pace because he had not taken his DM medication today.  Stressed to [employer] that he must always eat prior to coming to exercise as well as take his prescribed medications.  Tolerated exercise well did not stay for education so that he could eat something before going to work. Cherre Huger, BSN Cardiac and Training and development officer

## 2022-02-17 ENCOUNTER — Encounter (HOSPITAL_COMMUNITY)
Admission: RE | Admit: 2022-02-17 | Discharge: 2022-02-17 | Disposition: A | Discharge status: Home / Self Care | Payer: Medicare Other | Source: Ambulatory Visit | Attending: Cardiology | Admitting: Cardiology

## 2022-02-17 DIAGNOSIS — I2089 Other forms of angina pectoris: Secondary | ICD-10-CM

## 2022-02-17 DIAGNOSIS — Z955 Presence of coronary angioplasty implant and graft: Secondary | ICD-10-CM | POA: Diagnosis not present

## 2022-02-17 LAB — GLUCOSE, CAPILLARY: Glucose-Capillary: 165 mg/dL — ABNORMAL HIGH (ref 70–99)

## 2022-02-18 NOTE — Progress Notes (Cosign Needed Addendum)
Cardiac Individual Treatment Plan  Patient Details  Name: [name] MRN: [id number] Date of Birth: [birth year] Referring Provider:   Flowsheet Row INTENSIVE CARDIAC REHAB ORIENT from 01/16/2022 in [place]  Referring Provider [name], MD [name], MD - covering)       Initial Encounter Date:  [place] from 01/16/2022 in [place]  Date 01/16/22       Visit Diagnosis: Stable angina  10/08/21 S/P PCI/DES SVG OM1  Patient's Home Medications on Admission:  Current Outpatient Medications:    acetaminophen (TYLENOL) 500 MG tablet, Take 500-1,000 mg by mouth every 6 (six) hours as needed for moderate pain or mild pain., Disp: , Rfl:    anastrozole (ARIMIDEX) 1 MG tablet, Take 1 mg by mouth every other day., Disp: , Rfl:    aspirin 81 MG EC tablet, Take 81 mg by mouth daily., Disp: , Rfl:    atorvastatin (LIPITOR) 40 MG tablet, Take 40 mg by mouth at bedtime., Disp: , Rfl:    blood glucose meter kit and supplies KIT, Dispense based on patient and insurance preference. Use QD-BID home glucose monitoring. (FOR ICD-9 250.00, 250.01)., Disp: 1 each, Rfl: 0   carvedilol (COREG) 25 MG tablet, Take 25 mg by mouth 2 (two) times daily with a meal., Disp: , Rfl:    Cholecalciferol (VITAMIN D3) 125 MCG (5000 UT) TABS, Take 5,000 Units by mouth daily., Disp: , Rfl:    cinacalcet (SENSIPAR) 90 MG tablet, Take 90 mg by mouth every Monday, Wednesday, and Friday., Disp: , Rfl:    clomiPHENE (CLOMID) 50 MG tablet, Take 50 mg by mouth every other day., Disp: , Rfl:    ezetimibe (ZETIA) 10 MG tablet, Take 10 mg by mouth at bedtime., Disp: , Rfl:    gabapentin (NEURONTIN) 100 MG capsule, Take 100 mg by mouth 3 (three) times daily as needed (Nerve pain)., Disp: , Rfl:    glimepiride (AMARYL) 1 MG tablet, Take 1 mg by mouth daily., Disp: , Rfl:    icosapent Ethyl (VASCEPA) 1 g capsule, Take 2  g by mouth 2 (two) times daily., Disp: , Rfl:    lanthanum (FOSRENOL) 1000 MG chewable tablet, Chew 2,000 mg by mouth 3 (three) times daily with meals., Disp: , Rfl:    lidocaine-prilocaine (EMLA) cream, Apply 1 Application topically as needed (on port)., Disp: , Rfl:    nitroGLYCERIN (NITROSTAT) 0.4 MG SL tablet, DISSOLVE ONE TABLET UNDER THE TONGUE EVERY 5 MINUTES AS NEEDED FOR CHEST PAIN.  DO NOT EXCEED A TOTAL OF 3 DOSES IN 15 MINUTES (Patient taking differently: Place 0.4 mg under the tongue every 5 (five) minutes as needed for chest pain.), Disp: 25 tablet, Rfl: 3   REPATHA SURECLICK 277 MG/ML SOAJ, Inject 140 mg into the skin every 14 (fourteen) days., Disp: , Rfl:    tadalafil (CIALIS) 10 MG tablet, Take 10 mg by mouth every morning., Disp: , Rfl:    ticagrelor (BRILINTA) 90 MG TABS tablet, Take 90 mg by mouth 2 (two) times daily., Disp: , Rfl:    TRULICITY 1.5 AJ/2.8NO SOPN, Inject 1.5 mg into the skin once a week., Disp: , Rfl:   Past Medical History: Past Medical History:  Diagnosis Date   CAD (coronary artery disease)    CHF (congestive heart failure) (HCC)    Chronic kidney disease    Diabetes mellitus    Heart murmur    " slight"  Hyperlipidemia    Hypertension    Sleep apnea    wears CPAP    Tobacco Use: Social History   Tobacco Use  Smoking Status Former   Types: Cigarettes   Quit date: 09/17/2008   Years since quitting: 13.4  Smokeless Tobacco Never    Labs: Review Flowsheet  More data exists      Latest Ref Rng & Units 03/25/2015 01/01/2016 08/18/2016 07/06/2017 08/27/2021  Labs for ITP Cardiac and Pulmonary Rehab  Cholestrol 0 - 200 mg/dL - - 195  104  -  LDL (calc) 0 - 99 mg/dL - - 109  55  -  HDL-C >40 mg/dL - - 38  31  -  Trlycerides <150 mg/dL - - 240  88  -  Hemoglobin A1c 4.8 - 5.6 % 13.4  10.6  7.5  6.5  -  PH, Arterial 7.350 - 7.450 7.348  - - - -  PCO2 arterial 35.0 - 45.0 mmHg 40.0  - - - -  Bicarbonate 20.0 - 24.0 mEq/L 22.2  - - - -  TCO2 22 -  32 mmol/L 23  - - - 31   Acid-base deficit 0.0 - 2.0 mmol/L 3.0  - - - -  O2 Saturation % 97.0  - - - -    Capillary Blood Glucose: Lab Results  Component Value Date   GLUCAP 165 (H) 02/17/2022   GLUCAP 128 (H) 02/14/2022   GLUCAP 204 (H) 02/05/2022   GLUCAP 195 (H) 02/03/2022   GLUCAP 132 (H) 01/29/2022     Exercise Target Goals: Exercise Program Goal: Individual exercise prescription set using results from initial 6 min walk test and THRR while considering  patient's activity barriers and safety.   Exercise Prescription Goal: Initial exercise prescription builds to 30-45 minutes a day of aerobic activity, 2-3 days per week.  Home exercise guidelines will be given to patient during program as part of exercise prescription that the participant will acknowledge.  Activity Barriers & Risk Stratification:  Activity Barriers & Cardiac Risk Stratification - 01/16/22 1157       Activity Barriers & Cardiac Risk Stratification   Activity Barriers Back Problems;Deconditioning;Muscular Weakness;Decreased Ventricular Function;Balance Concerns    Cardiac Risk Stratification High             6 Minute Walk:  6 Minute Walk     Row Name 01/16/22 1056         6 Minute Walk   Phase Initial     Distance 1328 feet     Walk Time 6 minutes     # of Rest Breaks 0     MPH 12.28     METS 3.51     RPE 11     Perceived Dyspnea  0     VO2 Peak 12.28     Symptoms Yes (comment)     Comments Left calf pain 2/10     Resting HR 83 bpm     Resting BP 122/72     Resting Oxygen Saturation  98 %     Exercise Oxygen Saturation  during 6 min walk 97 %     Max Ex. HR 91 bpm     Max Ex. BP 126/81     2 Minute Post BP 123/83              Oxygen Initial Assessment:   Oxygen Re-Evaluation:   Oxygen Discharge (Final Oxygen Re-Evaluation):   Initial Exercise Prescription:  Initial Exercise Prescription - 01/16/22  1200       Date of Initial Exercise RX and Referring Provider    Date 01/16/22    Referring Provider [name], MD [name], MD - covering)    Expected Discharge Date 03/14/22      Recumbant Bike   Level 2    Watts 50    Minutes 15    METs 3.5      NuStep   Level 2    SPM 80    Minutes 15    METs 3.5      Prescription Details   Frequency (times per week) 3    Duration Progress to 30 minutes of continuous aerobic without signs/symptoms of physical distress      Intensity   THRR 40-80% of Max Heartrate 69-138    Ratings of Perceived Exertion 11-13    Perceived Dyspnea 0-4      Progression   Progression Continue to progress workloads to maintain intensity without signs/symptoms of physical distress.      Resistance Training   Training Prescription Yes    Weight 5 lbs    Reps 10-15             Perform Capillary Blood Glucose checks as needed.  Exercise Prescription Changes:   Exercise Prescription Changes     Row Name 01/24/22 1400 02/05/22 1400 02/17/22 1600         Response to Exercise   Blood Pressure (Admit) 110/80 134/83 112/70     Blood Pressure (Exercise) 112/78 137/81 108/66     Blood Pressure (Exit) 120/68 104/70 102/68     Heart Rate (Admit) 94 bpm 90 bpm 94 bpm     Heart Rate (Exercise) 106 bpm 101 bpm 112 bpm     Heart Rate (Exit) 93 bpm 89 bpm 94 bpm     Rating of Perceived Exertion (Exercise) _0 Symptoms Fatigue after dialysis today None None     Comments Pt's first day in the CRP2 program Reviewed METs Reviewed METs and goals     Duration Progress to 30 minutes of  aerobic without signs/symptoms of physical distress Progress to 30 minutes of  aerobic without signs/symptoms of physical distress Progress to 30 minutes of  aerobic without signs/symptoms of physical distress     Intensity THRR unchanged THRR unchanged THRR unchanged       Progression   Progression Continue to progress workloads to maintain intensity without signs/symptoms of physical distress. Continue to progress workloads  to maintain intensity without signs/symptoms of physical distress. Continue to progress workloads to maintain intensity without signs/symptoms of physical distress.     Average METs 1.6 2.1 2.1       Resistance Training   Training Prescription Yes No Yes     Weight 3 lbs No weights on Wednesdays 3 lbs     Reps 10-15 -- 10-15     Time 10 Minutes -- 10 Minutes       Interval Training   Interval Training No No No       Recumbant Bike   Level 2 -- --     Minutes 5 -- --     METs 1.5 -- --       NuStep   Level _1 SPM 84 -- 86     Minutes _2 METs 1.7 2.1 2.1  Exercise Comments:   Exercise Comments     Row Name 01/24/22 1415 02/05/22 1409 02/17/22 1612       Exercise Comments Pt's first day in the CRP2 program. Pt had dialysis before coming to exercise and was somewhat fatigued. Pt was able to complete session with no complaints other than feeling fatigued. Reviewed METs. Pt improving on MET level. Reviewed METs and goals. Increased to level 2 on Nustep. Pt comes to CR after dialysis. Some days the patient is more fatigued based upon how his dialysis goes.              Exercise Goals and Review:   Exercise Goals     Row Name 01/16/22 1158             Exercise Goals   Increase Physical Activity Yes       Intervention Provide advice, education, support and counseling about physical activity/exercise needs.;Develop an individualized exercise prescription for aerobic and resistive training based on initial evaluation findings, risk stratification, comorbidities and participant's personal goals.       Expected Outcomes Short Term: Attend rehab on a regular basis to increase amount of physical activity.;Long Term: Add in home exercise to make exercise part of routine and to increase amount of physical activity.;Long Term: Exercising regularly at least 3-5 days a week.       Increase Strength and Stamina Yes       Intervention Provide advice,  education, support and counseling about physical activity/exercise needs.;Develop an individualized exercise prescription for aerobic and resistive training based on initial evaluation findings, risk stratification, comorbidities and participant's personal goals.       Expected Outcomes Short Term: Increase workloads from initial exercise prescription for resistance, speed, and METs.;Short Term: Perform resistance training exercises routinely during rehab and add in resistance training at home;Long Term: Improve cardiorespiratory fitness, muscular endurance and strength as measured by increased METs and functional capacity (6MWT)       Able to understand and use rate of perceived exertion (RPE) scale Yes       Intervention Provide education and explanation on how to use RPE scale       Expected Outcomes Short Term: Able to use RPE daily in rehab to express subjective intensity level;Long Term:  Able to use RPE to guide intensity level when exercising independently       Knowledge and understanding of Target Heart Rate Range (THRR) Yes       Intervention Provide education and explanation of THRR including how the numbers were predicted and where they are located for reference       Expected Outcomes Short Term: Able to state/look up THRR;Short Term: Able to use daily as guideline for intensity in rehab;Long Term: Able to use THRR to govern intensity when exercising independently       Understanding of Exercise Prescription Yes       Intervention Provide education, explanation, and written materials on patient's individual exercise prescription       Expected Outcomes Short Term: Able to explain program exercise prescription;Long Term: Able to explain home exercise prescription to exercise independently                Exercise Goals Re-Evaluation :  Exercise Goals Re-Evaluation     [place] Name 01/24/22 1414 02/17/22 1608           Exercise Goal Re-Evaluation   Exercise Goals Review Increase  Physical Activity;Increase Strength and Stamina;Able to understand and use rate of  perceived exertion (RPE) scale;Knowledge and understanding of Target Heart Rate Range (THRR);Understanding of Exercise Prescription Increase Physical Activity;Increase Strength and Stamina;Able to understand and use rate of perceived exertion (RPE) scale;Knowledge and understanding of Target Heart Rate Range (THRR);Understanding of Exercise Prescription      Comments Pt's first day in the CRP2 program. Pt understands his exercise Rx, THRR and RPE scale. Reviewed METs and goals. Pt voices he still gets SOB going up stairs. Pt takes breaks during the nustep to rest.      Expected Outcomes Will continue to monitor and progress exercise workloads as tolerated. Will continue to monitor and progress exercise workloads as tolerated.               Discharge Exercise Prescription (Final Exercise Prescription Changes):  Exercise Prescription Changes - 02/17/22 1600       Response to Exercise   Blood Pressure (Admit) 112/70    Blood Pressure (Exercise) 108/66    Blood Pressure (Exit) 102/68    Heart Rate (Admit) 94 bpm    Heart Rate (Exercise) 112 bpm    Heart Rate (Exit) 94 bpm    Rating of Perceived Exertion (Exercise) 12    Symptoms None    Comments Reviewed METs and goals    Duration Progress to 30 minutes of  aerobic without signs/symptoms of physical distress    Intensity THRR unchanged      Progression   Progression Continue to progress workloads to maintain intensity without signs/symptoms of physical distress.    Average METs 2.1      Resistance Training   Training Prescription Yes    Weight 3 lbs    Reps 10-15    Time 10 Minutes      Interval Training   Interval Training No      NuStep   Level 2    SPM 86    Minutes 26    METs 2.1             Nutrition:  Target Goals: Understanding of nutrition guidelines, daily intake of sodium <1551m, cholesterol <2037m calories 30% from fat and  7% or less from saturated fats, daily to have 5 or more servings of fruits and vegetables.  Biometrics:  Pre Biometrics - 01/16/22 1000       Pre Biometrics   Waist Circumference 46.5 inches    Hip Circumference 44.25 inches    Waist to Hip Ratio 1.05 %    Triceps Skinfold 18 mm    % Body Fat 34.6 %    Grip Strength 30 kg    Flexibility 0 in   Unable to reach   Single Leg Stand 7.43 seconds              Nutrition Therapy Plan and Nutrition Goals:  Nutrition Therapy & Goals - 01/24/22 1400       Nutrition Therapy   Diet Heart Healthy/Carbohydrate Consistent/Renal diet    Drug/Food Interactions Statins/Certain Fruits      Personal Nutrition Goals   Nutrition Goal Patient to identify strategies for managing cardiovascular risk by attending the weekly Pritikin education and nutition series    Personal Goal #2 Patient to reduce sodium to <150033mer day.    Personal Goal #3 Patient to identify food sources and limit daily intake of saturated fat, trans fat, sodium, and refined carbohydrates.    Comments [name] completes dialysis Monday/Wednesday/Friday. 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  They do the grocery shopping and cooking together. He does work out with a Physiological scientist Tuesday and Thursday. His A1c is improved to 6.6 since starting Trulicity. He reports inconsistencies in wearing his CPAP due to the mask. Due to his busy schedule, he is interested in learning more about healthier convenience food options. We discussed reading food labels for sodium, reading sodium intake to <1538m/day. Gave resources Pritikin Modifications for Kidney Disease and Healthy Convenience foods.      Intervention Plan   Intervention Prescribe, educate and counsel regarding individualized specific dietary modifications aiming towards targeted core components such as weight, hypertension, lipid management, diabetes, heart  failure and other comorbidities.;Nutrition handout(s) given to patient.    Expected Outcomes Short Term Goal: Understand basic principles of dietary content, such as calories, fat, sodium, cholesterol and nutrients.;Short Term Goal: A plan has been developed with personal nutrition goals set during dietitian appointment.;Long Term Goal: Adherence to prescribed nutrition plan.             Nutrition Assessments:  MEDIFICTS Score Key: ?70 Need to make dietary changes  40-70 Heart Healthy Diet ? 40 Therapeutic Level Cholesterol Diet   Flowsheet Row CARDIAC REHAB PHASE II EXERCISE from 12/24/2020 in [facility] Picture Your Plate Total Score on Discharge 66      Picture Your Plate Scores: <<72Unhealthy dietary pattern with much room for improvement. 41-50 Dietary pattern unlikely to meet recommendations for good health and room for improvement. 51-60 More healthful dietary pattern, with some room for improvement.  >60 Healthy dietary pattern, although there may be some specific behaviors that could be improved.    Nutrition Goals Re-Evaluation:  Nutrition Goals Re-Evaluation     Row Name 01/24/22 1400             Goals   Current Weight 239 lb 13.8 oz (108.8 kg)       Comment Triglycerides 247, A1c 6.6; history of anemia related to chronic renal failure.       Expected Outcome [name] completes dialysis Monday/Wednesday/Friday. 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 They do the grocery shopping and cooking together. He does work out with a pPhysiological scientistTuesday and Thursday. His A1c is improved to 6.6 since starting Trulicity. He reports inconsistencies in wearing his CPAP due to the mask. Due to his busy schedule, he is interested in learning more about healthier convenience food options. We discussed reading food labels for sodium, reading sodium intake to <15087mday. Gave  resources Pritikin Modifications for Kidney Disease and Healthy Convenience foods. Answered questions about deli meat. He has completed cardiac rehab two other times and does have access to a RD at dialysis. He continues follow-up with nephrology and transplant team.                Nutrition Goals Re-Evaluation:  Nutrition Goals Re-Evaluation     Row Name 01/24/22 1400             Goals   Current Weight 239 lb 13.8 oz (108.8 kg)       Comment Triglycerides 247, A1c 6.6; history of anemia related to chronic renal failure.       Expected Outcome [name] completes dialysis Monday/Wednesday/Friday. 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 They do the grocery shopping and cooking together. He does work out with a pePhysiological scientist  Tuesday and Thursday. His A1c is improved to 6.6 since starting Trulicity. He reports inconsistencies in wearing his CPAP due to the mask. Due to his busy schedule, he is interested in learning more about healthier convenience food options. We discussed reading food labels for sodium, reading sodium intake to <1519m/day. Gave resources Pritikin Modifications for Kidney Disease and Healthy Convenience foods. Answered questions about deli meat. He has completed cardiac rehab two other times and does have access to a RD at dialysis. He continues follow-up with nephrology and transplant team.                Nutrition Goals Discharge (Final Nutrition Goals Re-Evaluation):  Nutrition Goals Re-Evaluation - 01/24/22 1400       Goals   Current Weight 239 lb 13.8 oz (108.8 kg)    Comment Triglycerides 247, A1c 6.6; history of anemia related to chronic renal failure.    Expected Outcome [name] completes dialysis Monday/Wednesday/Friday. 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 They do the grocery shopping and cooking together. He does work  out with a pPhysiological scientistTuesday and Thursday. His A1c is improved to 6.6 since starting Trulicity. He reports inconsistencies in wearing his CPAP due to the mask. Due to his busy schedule, he is interested in learning more about healthier convenience food options. We discussed reading food labels for sodium, reading sodium intake to <15024mday. Gave resources Pritikin Modifications for Kidney Disease and Healthy Convenience foods. Answered questions about deli meat. He has completed cardiac rehab two other times and does have access to a RD at dialysis. He continues follow-up with nephrology and transplant team.             Psychosocial: Target Goals: Acknowledge presence or absence of significant depression and/or stress, maximize coping skills, provide positive support system. Participant is able to verbalize types and ability to use techniques and skills needed for reducing stress and depression.  Initial Review & Psychosocial Screening:  Initial Psych Review & Screening - 01/16/22 1206       Initial Review   Current issues with None Identified      Family Dynamics   Good Support System? Yes   LaVincente Libertyas his wife, friend and cousins for support     Barriers   Psychosocial barriers to participate in program There are no identifiable barriers or psychosocial needs.      Screening Interventions   Interventions Encouraged to exercise;Provide feedback about the scores to participant    Expected Outcomes Short Term goal: Identification and review with participant of any Quality of Life or Depression concerns found by scoring the questionnaire.;Long Term goal: The participant improves quality of Life and PHQ9 Scores as seen by post scores and/or verbalization of changes             Quality of Life Scores:  Quality of Life - 01/16/22 1152       Quality of Life   Select Quality of Life      Quality of Life Scores   Health/Function Pre 21.96 %    Socioeconomic Pre 17.21 %     Psych/Spiritual Pre 21.43 %    Family Pre 21 %    GLOBAL Pre 20.69 %            Scores of 19 and below usually indicate a poorer quality of life in these areas.  A difference of  2-3 points is a clinically meaningful difference.  A difference of 2-3 points in the total score of the Quality of Life Index has been associated with significant improvement in overall quality of life, self-image, physical symptoms, and general health in studies assessing change in quality of life.  PHQ-9: Review Flowsheet  More data exists      01/16/2022 12/24/2020 11/01/2020 08/29/2016 08/18/2016  Depression screen PHQ 2/9  Decreased Interest 0 0 0 0 0  Down, Depressed, Hopeless 0 0 0 0 0  PHQ - 2 Score 0 0 0 0 0   Interpretation of Total Score  Total Score Depression Severity:  1-4 = Minimal depression, 5-9 = Mild depression, 10-14 = Moderate depression, 15-19 = Moderately severe depression, 20-27 = Severe depression   Psychosocial Evaluation and Intervention:   Psychosocial Re-Evaluation:  Psychosocial Re-Evaluation     [place] Name 01/24/22 1646 02/18/22 1801           Psychosocial Re-Evaluation   Current issues with Current Stress Concerns Current Stress Concerns      Comments [name] does not voice any concerns on his first day of exercise [name] has not voiced any increased concerns or stressors despite attending cardiac rehab on his HD days .      Expected Outcomes [name] will have less  or controlled stress upon completion of intensive cardiac rehab [name] will have less  or controlled stress upon completion of intensive cardiac rehab      Interventions Encouraged to attend Cardiac Rehabilitation for the exercise Encouraged to attend Cardiac Rehabilitation for the exercise      Continue Psychosocial Services  No Follow up required No Follow up required        Initial Review   Source of Stress Concerns Chronic Illness Chronic Illness      Comments Will continue to monitor and offer support as needed  Will continue to monitor and offer support as needed               Psychosocial Discharge (Final Psychosocial Re-Evaluation):  Psychosocial Re-Evaluation - 02/18/22 1801       Psychosocial Re-Evaluation   Current issues with Current Stress Concerns    Comments [name] has not voiced any increased concerns or stressors despite attending cardiac rehab on his HD days .    Expected Outcomes [name] will have less  or controlled stress upon completion of intensive cardiac rehab    Interventions Encouraged to attend Cardiac Rehabilitation for the exercise    Continue Psychosocial Services  No Follow up required      Initial Review   Source of Stress Concerns Chronic Illness    Comments Will continue to monitor and offer support as needed             Vocational Rehabilitation: Provide vocational rehab assistance to qualifying candidates.   Vocational Rehab Evaluation & Intervention:  Vocational Rehab - 01/16/22 1209       Initial Vocational Rehab Evaluation & Intervention   Assessment shows need for Vocational Rehabilitation No   [name] works full time and does not need vocational rehab at this time            Education: Education Goals: Education classes will be provided on a weekly basis, covering required topics. Participant will state understanding/return demonstration of topics presented.    Education     Row Name 01/24/22 1600     Education   Cardiac Education Topics Pritikin   Insurance risk surveyor  Select Psychosocial   Psychosocial How Our Thoughts Can Heal Our Hearts   Instruction Review Code 1- Verbalizes Understanding   Class Start Time 1358   Class Stop Time 1436   Class Time Calculation (min) 38 min    Row Name 01/29/22 1500     Education   Cardiac Education Topics Pritikin   Financial trader   Weekly Topic Adding Flavor - Sodium-Free   Instruction Review Code 1-  Verbalizes Understanding   Class Start Time 1355   Class Stop Time 1440   Class Time Calculation (min) 45 min    [place] Name 02/03/22 1500     Education   Cardiac Education Topics Pritikin   Select Workshops     Workshops   Educator Exercise Physiologist   Select Psychosocial   Psychosocial Workshop Recognizing and Reducing Stress   Instruction Review Code 1- Verbalizes Understanding   Class Start Time 1350   Class Stop Time 1448   Class Time Calculation (min) 58 min    [place] Name 02/05/22 1500     Education   Cardiac Education Topics Pritikin   Financial trader   Weekly Topic Fast and Healthy Breakfasts   Instruction Review Code 1- Verbalizes Understanding   Class Start Time 1350   Class Stop Time 1434   Class Time Calculation (min) 44 min    [place] Name 02/12/22 1500     Education   Cardiac Education Topics Pritikin   Financial trader   Weekly Topic Personalizing Your Pritikin Plate   Instruction Review Code 1- Verbalizes Understanding   Class Start Time 5498   Class Stop Time 1435   Class Time Calculation (min) 40 min    [place] Name 02/17/22 1500     Education   Cardiac Education Topics Pritikin   IT sales professional Nutrition   Nutrition Workshop Label Reading   Instruction Review Code 1- Verbalizes Understanding   Class Start Time 1355   Class Stop Time 1445   Class Time Calculation (min) 50 min            Core Videos: Exercise    Move It!  Clinical staff conducted group or individual video education with verbal and written material and guidebook.  Patient learns the recommended Pritikin exercise program. Exercise with the goal of living a long, healthy life. Some of the health benefits of exercise include controlled diabetes, healthier blood pressure levels, improved cholesterol levels, improved heart and lung capacity,  improved sleep, and better body composition. Everyone should speak with their doctor before starting or changing an exercise routine.  Biomechanical Limitations Clinical staff conducted group or individual video education with verbal and written material and guidebook.  Patient learns how biomechanical limitations can impact exercise and how we can mitigate and possibly overcome limitations to have an impactful and balanced exercise routine.  Body Composition Clinical staff conducted group or individual video education with verbal and written material and guidebook.  Patient learns that body composition (ratio of muscle mass to fat mass) is a key component to assessing overall fitness, rather than body weight alone. Increased fat mass, especially visceral belly fat, can put [place] at increased risk for metabolic syndrome, type 2 diabetes, heart disease, and even death. It is recommended to  combine diet and exercise (cardiovascular and resistance training) to improve your body composition. Seek guidance from your physician and exercise physiologist before implementing an exercise routine.  Exercise Action Plan Clinical staff conducted group or individual video education with verbal and written material and guidebook.  Patient learns the recommended strategies to achieve and enjoy long-term exercise adherence, including variety, self-motivation, self-efficacy, and positive decision making. Benefits of exercise include fitness, good health, weight management, more energy, better sleep, less stress, and overall well-being.  Medical   Heart Disease Risk Reduction Clinical staff conducted group or individual video education with verbal and written material and guidebook.  Patient learns our heart is our most vital organ as it circulates oxygen, nutrients, white blood cells, and hormones throughout the entire body, and carries waste away. Data supports a plant-based eating plan like the Pritikin Program for  its effectiveness in slowing progression of and reversing heart disease. The video provides a number of recommendations to address heart disease.   Metabolic Syndrome and Belly Fat  Clinical staff conducted group or individual video education with verbal and written material and guidebook.  Patient learns what metabolic syndrome is, how it leads to heart disease, and how one can reverse it and keep it from coming back. You have metabolic syndrome if you have 3 of the following 5 criteria: abdominal obesity, high blood pressure, high triglycerides, low HDL cholesterol, and high blood sugar.  Hypertension and Heart Disease Clinical staff conducted group or individual video education with verbal and written material and guidebook.  Patient learns that high blood pressure, or hypertension, is very common in the [place]. Hypertension is largely due to excessive salt intake, but other important risk factors include being overweight, physical inactivity, drinking too much alcohol, smoking, and not eating enough potassium from fruits and vegetables. High blood pressure is a leading risk factor for heart attack, stroke, congestive heart failure, dementia, kidney failure, and premature death. Long-term effects of excessive salt intake include stiffening of the arteries and thickening of heart muscle and organ damage. Recommendations include ways to reduce hypertension and the risk of heart disease.  Diseases of Our Time - Focusing on Diabetes Clinical staff conducted group or individual video education with verbal and written material and guidebook.  Patient learns why the best way to stop diseases of our time is prevention, through food and other lifestyle changes. Medicine (such as prescription pills and surgeries) is often only a Band-Aid on the problem, not a long-term solution. Most common diseases of our time include obesity, type 2 diabetes, hypertension, heart disease, and cancer. The Pritikin  Program is recommended and has been proven to help reduce, reverse, and/or prevent the damaging effects of metabolic syndrome.  Nutrition   Overview of the Pritikin Eating Plan  Clinical staff conducted group or individual video education with verbal and written material and guidebook.  Patient learns about the [place] for disease risk reduction. The [place] emphasizes a wide variety of unrefined, minimally-processed carbohydrates, like fruits, vegetables, whole grains, and legumes. Go, Caution, and Stop food choices are explained. Plant-based and lean animal proteins are emphasized. Rationale provided for low sodium intake for blood pressure control, low added sugars for blood sugar stabilization, and low added fats and oils for coronary artery disease risk reduction and weight management.  Calorie Density  Clinical staff conducted group or individual video education with verbal and written material and guidebook.  Patient learns about calorie density and how it impacts the Pritikin  Eating Plan. Knowing the characteristics of the food you choose will help you decide whether those foods will lead to weight gain or weight loss, and whether you want to consume more or less of them. Weight loss is usually a side effect of the Pritikin Eating Plan because of its focus on low calorie-dense foods.  Label Reading  Clinical staff conducted group or individual video education with verbal and written material and guidebook.  Patient learns about the Pritikin recommended label reading guidelines and corresponding recommendations regarding calorie density, added sugars, sodium content, and whole grains.  Dining Out - Part 1  Clinical staff conducted group or individual video education with verbal and written material and guidebook.  Patient learns that restaurant meals can be sabotaging because they can be so high in calories, fat, sodium, and/or sugar. Patient learns recommended  strategies on how to positively address this and avoid unhealthy pitfalls.  Facts on Fats  Clinical staff conducted group or individual video education with verbal and written material and guidebook.  Patient learns that lifestyle modifications can be just as effective, if not more so, as many medications for lowering your risk of heart disease. A Pritikin lifestyle can help to reduce your risk of inflammation and atherosclerosis (cholesterol build-up, or plaque, in the artery walls). Lifestyle interventions such as dietary choices and physical activity address the cause of atherosclerosis. A review of the types of fats and their impact on blood cholesterol levels, along with dietary recommendations to reduce fat intake is also included.  Nutrition Action Plan  Clinical staff conducted group or individual video education with verbal and written material and guidebook.  Patient learns how to incorporate Pritikin recommendations into their lifestyle. Recommendations include planning and keeping personal health goals in mind as an important part of their success.  Healthy Mind-Set    Healthy Minds, Bodies, Hearts  Clinical staff conducted group or individual video education with verbal and written material and guidebook.  Patient learns how to identify when they are stressed. Video will discuss the impact of that stress, as well as the many benefits of stress management. Patient will also be introduced to stress management techniques. The way we think, act, and feel has an impact on our hearts.  How Our Thoughts Can Heal Our Hearts  Clinical staff conducted group or individual video education with verbal and written material and guidebook.  Patient learns that negative thoughts can cause depression and anxiety. This can result in negative lifestyle behavior and serious health problems. Cognitive behavioral therapy is an effective method to help control our thoughts in order to change and improve our  emotional outlook.  Additional Videos:  Exercise    Improving Performance  Clinical staff conducted group or individual video education with verbal and written material and guidebook.  Patient learns to use a non-linear approach by alternating intensity levels and lengths of time spent exercising to help burn more calories and lose more body fat. Cardiovascular exercise helps improve heart health, metabolism, hormonal balance, blood sugar control, and recovery from fatigue. Resistance training improves strength, endurance, balance, coordination, reaction time, metabolism, and muscle mass. Flexibility exercise improves circulation, posture, and balance. Seek guidance from your physician and exercise physiologist before implementing an exercise routine and learn your capabilities and proper form for all exercise.  Introduction to Yoga  Clinical staff conducted group or individual video education with verbal and written material and guidebook.  Patient learns about yoga, a discipline of the coming together of mind, breath, and  body. The benefits of yoga include improved flexibility, improved range of motion, better posture and core strength, increased lung function, weight loss, and positive self-image. Yoga's heart health benefits include lowered blood pressure, healthier heart rate, decreased cholesterol and triglyceride levels, improved immune function, and reduced stress. Seek guidance from your physician and exercise physiologist before implementing an exercise routine and learn your capabilities and proper form for all exercise.  Medical   Aging: Enhancing Your Quality of Life  Clinical staff conducted group or individual video education with verbal and written material and guidebook.  Patient learns key strategies and recommendations to stay in good physical health and enhance quality of life, such as prevention strategies, having an advocate, securing a [place],  and keeping a list of medications and system for tracking them. It also discusses how to avoid risk for bone loss.  Biology of Weight Control  Clinical staff conducted group or individual video education with verbal and written material and guidebook.  Patient learns that weight gain occurs because we consume more calories than we burn (eating more, moving less). Even if your body weight is normal, you may have higher ratios of fat compared to muscle mass. Too much body fat puts you at increased risk for cardiovascular disease, heart attack, stroke, type 2 diabetes, and obesity-related cancers. In addition to exercise, following the [place] can help reduce your risk.  Decoding Lab Results  Clinical staff conducted group or individual video education with verbal and written material and guidebook.  Patient learns that lab test reflects one measurement whose values change over time and are influenced by many factors, including medication, stress, sleep, exercise, food, hydration, pre-existing medical conditions, and more. It is recommended to use the knowledge from this video to become more involved with your lab results and evaluate your numbers to speak with your doctor.   Diseases of Our Time - Overview  Clinical staff conducted group or individual video education with verbal and written material and guidebook.  Patient learns that according to the CDC, 50% to 70% of chronic diseases (such as obesity, type 2 diabetes, elevated lipids, hypertension, and heart disease) are avoidable through lifestyle improvements including healthier food choices, listening to satiety cues, and increased physical activity.  Sleep Disorders Clinical staff conducted group or individual video education with verbal and written material and guidebook.  Patient learns how good quality and duration of sleep are important to overall health and well-being. Patient also learns about sleep disorders and how they  impact health along with recommendations to address them, including discussing with a physician.  Nutrition  Dining Out - Part 2 Clinical staff conducted group or individual video education with verbal and written material and guidebook.  Patient learns how to plan ahead and communicate in order to maximize their dining experience in a healthy and nutritious manner. Included are recommended food choices based on the type of restaurant the patient is visiting.   Fueling a Best boy conducted group or individual video education with verbal and written material and guidebook.  There is a strong connection between our food choices and our health. Diseases like obesity and type 2 diabetes are very prevalent and are in large-part due to lifestyle choices. The Pritikin Eating Plan provides plenty of food and hunger-curbing satisfaction. It is easy to follow, affordable, and helps reduce health risks.  Menu Workshop  Clinical staff conducted group or individual video education with verbal and written  material and guidebook.  Patient learns that restaurant meals can sabotage health goals because they are often packed with calories, fat, sodium, and sugar. Recommendations include strategies to plan ahead and to communicate with the manager, chef, or server to help order a healthier meal.  Planning Your Eating Strategy  Clinical staff conducted group or individual video education with verbal and written material and guidebook.  Patient learns about the [place] and its benefit of reducing the risk of disease. The [place] does not focus on calories. Instead, it emphasizes high-quality, nutrient-rich foods. By knowing the characteristics of the foods, we choose, we can determine their calorie density and make informed decisions.  Targeting Your Nutrition Priorities  Clinical staff conducted group or individual video education with verbal and written material and  guidebook.  Patient learns that lifestyle habits have a tremendous impact on disease risk and progression. This video provides eating and physical activity recommendations based on your personal health goals, such as reducing LDL cholesterol, losing weight, preventing or controlling type 2 diabetes, and reducing high blood pressure.  Vitamins and Minerals  Clinical staff conducted group or individual video education with verbal and written material and guidebook.  Patient learns different ways to obtain key vitamins and minerals, including through a recommended healthy diet. It is important to discuss all supplements you take with your doctor.   Healthy Mind-Set    Smoking Cessation  Clinical staff conducted group or individual video education with verbal and written material and guidebook.  Patient learns that cigarette smoking and tobacco addiction pose a serious health risk which affects millions of people. Stopping smoking will significantly reduce the risk of heart disease, lung disease, and many forms of cancer. Recommended strategies for quitting are covered, including working with your doctor to develop a successful plan.  Culinary   Becoming a Financial trader conducted group or individual video education with verbal and written material and guidebook.  Patient learns that cooking at home can be healthy, cost-effective, quick, and puts them in control. Keys to cooking healthy recipes will include looking at your recipe, assessing your equipment needs, planning ahead, making it simple, choosing cost-effective seasonal ingredients, and limiting the use of added fats, salts, and sugars.  Cooking - Breakfast and Snacks  Clinical staff conducted group or individual video education with verbal and written material and guidebook.  Patient learns how important breakfast is to satiety and nutrition through the entire day. Recommendations include key foods to eat during breakfast to  help stabilize blood sugar levels and to prevent overeating at meals later in the day. Planning ahead is also a key component.  Cooking - Human resources officer conducted group or individual video education with verbal and written material and guidebook.  Patient learns eating strategies to improve overall health, including an approach to cook more at home. Recommendations include thinking of animal protein as a side on your plate rather than center stage and focusing instead on lower calorie dense options like vegetables, fruits, whole grains, and plant-based proteins, such as beans. Making sauces in large quantities to freeze for later and leaving the skin on your vegetables are also recommended to maximize your experience.  Cooking - Healthy Salads and Dressing Clinical staff conducted group or individual video education with verbal and written material and guidebook.  Patient learns that vegetables, fruits, whole grains, and legumes are the foundations of the [place]. Recommendations include how to incorporate each of  these in flavorful and healthy salads, and how to create homemade salad dressings. Proper handling of ingredients is also covered. Cooking - Soups and [employer] - Soups and Desserts Clinical staff conducted group or individual video education with verbal and written material and guidebook.  Patient learns that Pritikin soups and desserts make for easy, nutritious, and delicious snacks and meal components that are low in sodium, fat, sugar, and calorie density, while high in vitamins, minerals, and filling fiber. Recommendations include simple and healthy ideas for soups and desserts.   Overview     The Pritikin Solution Program Overview Clinical staff conducted group or individual video education with verbal and written material and guidebook.  Patient learns that the results of the [place] Program have been documented in more than 100 articles  published in peer-reviewed journals, and the benefits include reducing risk factors for (and, in some cases, even reversing) high cholesterol, high blood pressure, type 2 diabetes, obesity, and more! An overview of the three key pillars of the Pritikin Program will be covered: eating well, doing regular exercise, and having a healthy mind-set.  WORKSHOPS  Exercise: Exercise Basics: Building Your Action Plan Clinical staff led group instruction and group discussion with PowerPoint presentation and patient guidebook. To enhance the learning environment the use of posters, models and videos may be added. At the conclusion of this workshop, patients will comprehend the difference between physical activity and exercise, as well as the benefits of incorporating both, into their routine. Patients will understand the FITT (Frequency, Intensity, Time, and Type) principle and how to use it to build an exercise action plan. In addition, safety concerns and other considerations for exercise and cardiac rehab will be addressed by the presenter. The purpose of this lesson is to promote a comprehensive and effective weekly exercise routine in order to improve patients' overall level of fitness.   Managing Heart Disease: Your Path to a Healthier Heart Clinical staff led group instruction and group discussion with PowerPoint presentation and patient guidebook. To enhance the learning environment the use of posters, models and videos may be added.At the conclusion of this workshop, patients will understand the anatomy and physiology of the heart. Additionally, they will understand how Pritikin's three pillars impact the risk factors, the progression, and the management of heart disease.  The purpose of this lesson is to provide a high-level overview of the heart, heart disease, and how the Pritikin lifestyle positively impacts risk factors.  Exercise Biomechanics Clinical staff led group instruction and group  discussion with PowerPoint presentation and patient guidebook. To enhance the learning environment the use of posters, models and videos may be added. Patients will learn how the structural parts of their bodies function and how these functions impact their daily activities, movement, and exercise. Patients will learn how to promote a neutral spine, learn how to manage pain, and identify ways to improve their physical movement in order to promote healthy living. The purpose of this lesson is to expose patients to common physical limitations that impact physical activity. Participants will learn practical ways to adapt and manage aches and pains, and to minimize their effect on regular exercise. Patients will learn how to maintain good posture while sitting, walking, and lifting.  Balance Training and Fall Prevention  Clinical staff led group instruction and group discussion with PowerPoint presentation and patient guidebook. To enhance the learning environment the use of posters, models and videos may be added. At the conclusion of this workshop, patients will understand  the importance of their sensorimotor skills (vision, proprioception, and the vestibular system) in maintaining their ability to balance as they age. Patients will apply a variety of balancing exercises that are appropriate for their current level of function. Patients will understand the common causes for poor balance, possible solutions to these problems, and ways to modify their physical environment in order to minimize their fall risk. The purpose of this lesson is to teach patients about the importance of maintaining balance as they age and ways to minimize their risk of falling.  WORKSHOPS   Nutrition:  Fueling a Scientist, research (physical sciences) led group instruction and group discussion with PowerPoint presentation and patient guidebook. To enhance the learning environment the use of posters, models and videos may be added.  Patients will review the foundational principles of the [place] and understand what constitutes a serving size in each of the food groups. Patients will also learn Pritikin-friendly foods that are better choices when away from home and review make-ahead meal and snack options. Calorie density will be reviewed and applied to three nutrition priorities: weight maintenance, weight loss, and weight gain. The purpose of this lesson is to reinforce (in a group setting) the key concepts around what patients are recommended to eat and how to apply these guidelines when away from home by planning and selecting Pritikin-friendly options. Patients will understand how calorie density may be adjusted for different weight management goals.  Mindful Eating  Clinical staff led group instruction and group discussion with PowerPoint presentation and patient guidebook. To enhance the learning environment the use of posters, models and videos may be added. Patients will briefly review the concepts of the [place] and the importance of low-calorie dense foods. The concept of mindful eating will be introduced as well as the importance of paying attention to internal hunger signals. Triggers for non-hunger eating and techniques for dealing with triggers will be explored. The purpose of this lesson is to provide patients with the opportunity to review the basic principles of the [place], discuss the value of eating mindfully and how to measure internal cues of hunger and fullness using the Hunger Scale. Patients will also discuss reasons for non-hunger eating and learn strategies to use for controlling emotional eating.  Targeting Your Nutrition Priorities Clinical staff led group instruction and group discussion with PowerPoint presentation and patient guidebook. To enhance the learning environment the use of posters, models and videos may be added. Patients will learn how to determine their  genetic susceptibility to disease by reviewing their family history. Patients will gain insight into the importance of diet as part of an overall healthy lifestyle in mitigating the impact of genetics and other environmental insults. The purpose of this lesson is to provide patients with the opportunity to assess their personal nutrition priorities by looking at their family history, their own health history and current risk factors. Patients will also be able to discuss ways of prioritizing and modifying the [place] for their highest risk areas  Menu  Clinical staff led group instruction and group discussion with PowerPoint presentation and patient guidebook. To enhance the learning environment the use of posters, models and videos may be added. Using menus brought in from [employer], or printed from [employer], patients will apply the [place] dining out guidelines that were presented in the [employer] video. Patients will also be able to practice these guidelines in a variety of provided scenarios. The purpose of  this lesson is to provide patients with the opportunity to practice hands-on learning of the [place] with actual menus and practice scenarios.  Label Reading Clinical staff led group instruction and group discussion with PowerPoint presentation and patient guidebook. To enhance the learning environment the use of posters, models and videos may be added. Patients will review and discuss the Pritikin label reading guidelines presented in [employer]in's Label Reading Educational series video. Using fool labels brought in from local grocery stores and markets, patients will apply the label reading guidelines and determine if the packaged food meet the Pritikin guidelines. The purpose of this lesson is to provide patients with the opportunity to review, discuss, and practice hands-on learning of the [facility]ritikin Label Reading guidelines with  actual packaged food labels. [place] Workshops are designed to teach patients ways to prepare quick, simple, and affordable recipes at home. The importance of nutrition's role in chronic disease risk reduction is reflected in its emphasis in the overall Pritikin program. By learning how to prepare essential core Pritikin Eating Plan recipes, patients will increase control over what they eat; be able to customize the flavor of foods without the use of added salt, sugar, or fat; and improve the quality of the food they consume. By learning a set of core recipes which are easily assembled, quickly prepared, and affordable, patients are more likely to prepare more healthy foods at home. These workshops focus on convenient breakfasts, simple entres, side dishes, and desserts which can be prepared with minimal effort and are consistent with nutrition recommendations for cardiovascular risk reduction. Cooking [employer] are taught by a Engineer, materials (RD) who has been trained by the [employer]. The chef or RD has a clear understanding of the importance of minimizing - if not completely eliminating - added fat, sugar, and sodium in recipes. Throughout the series of [place] Workshop sessions, patients will learn about healthy ingredients and efficient methods of cooking to build confidence in their capability to prepare    Cooking School weekly topics:  Adding Flavor- Sodium-Free  Fast and Healthy Breakfasts  Powerhouse Plant-Based Proteins  Satisfying Salads and Dressings  Simple Sides and Sauces  International Cuisine-Spotlight on the [place] Zones  Delicious Desserts  Savory Soups  Efficiency Cooking - Meals in a Snap  Tasty Appetizers and Snacks  Comforting Weekend Breakfasts  One-Pot Wonders   Fast Evening Meals  Easy [place] (Psychosocial): New  Thoughts, New Behaviors Clinical staff led group instruction and group discussion with PowerPoint presentation and patient guidebook. To enhance the learning environment the use of posters, models and videos may be added. Patients will learn and practice techniques for developing effective health and lifestyle goals. Patients will be able to effectively apply the goal setting process learned to develop at least one new personal goal.  The purpose of this lesson is to expose patients to a new skill set of behavior modification techniques such as techniques setting SMART goals, overcoming barriers, and achieving new thoughts and new behaviors.  Managing Moods and Relationships Clinical staff led group instruction and group discussion with PowerPoint presentation and patient guidebook. To enhance the learning environment the use of posters, models and videos may be added. Patients will learn how emotional and chronic stress factors can impact their health and relationships. They will learn healthy ways to manage their moods and utilize positive coping mechanisms. In addition,  ICR patients will learn ways to improve communication skills. The purpose of this lesson is to expose patients to ways of understanding how one's mood and health are intimately connected. Developing a healthy outlook can help build positive relationships and connections with others. Patients will understand the importance of utilizing effective communication skills that include actively listening and being heard. They will learn and understand the importance of the "4 Cs" and especially Connections in fostering of a Healthy Mind-Set.  Healthy Sleep for a Healthy Heart Clinical staff led group instruction and group discussion with PowerPoint presentation and patient guidebook. To enhance the learning environment the use of posters, models and videos may be added. At the conclusion of this workshop, patients will be able to demonstrate  knowledge of the importance of sleep to overall health, well-being, and quality of life. They will understand the symptoms of, and treatments for, common sleep disorders. Patients will also be able to identify daytime and nighttime behaviors which impact sleep, and they will be able to apply these tools to help manage sleep-related challenges. The purpose of this lesson is to provide patients with a general overview of sleep and outline the importance of quality sleep. Patients will learn about a few of the most common sleep disorders. Patients will also be introduced to the concept of "sleep hygiene," and discover ways to self-manage certain sleeping problems through simple daily behavior changes. Finally, the workshop will motivate patients by clarifying the links between quality sleep and their goals of heart-healthy living.   Recognizing and Reducing Stress Clinical staff led group instruction and group discussion with PowerPoint presentation and patient guidebook. To enhance the learning environment the use of posters, models and videos may be added. At the conclusion of this workshop, patients will be able to understand the types of stress reactions, differentiate between acute and chronic stress, and recognize the impact that chronic stress has on their health. They will also be able to apply different coping mechanisms, such as reframing negative self-talk. Patients will have the opportunity to practice a variety of stress management techniques, such as deep abdominal breathing, progressive muscle relaxation, and/or guided imagery.  The purpose of this lesson is to educate patients on the role of stress in their lives and to provide healthy techniques for coping with it.  Learning Barriers/Preferences:  Learning Barriers/Preferences - 01/16/22 1155       Learning Barriers/Preferences   Learning Barriers Sight   wears glasses   Learning Preferences Skilled Demonstration;Individual Instruction;Group  Instruction             Education Topics:  Knowledge Questionnaire Score:  Knowledge Questionnaire Score - 01/16/22 1153       Knowledge Questionnaire Score   Pre Score 23/24             Core Components/Risk Factors/Patient Goals at Admission:  Personal Goals and Risk Factors at Admission - 01/16/22 1153       Core Components/Risk Factors/Patient Goals on Admission    Weight Management Yes;Obesity;Weight Loss    Intervention Weight Management: Develop a combined nutrition and exercise program designed to reach desired caloric intake, while maintaining appropriate intake of nutrient and fiber, sodium and fats, and appropriate energy expenditure required for the weight goal.;Weight Management: Provide education and appropriate resources to help participant work on and attain dietary goals.;Weight Management/Obesity: Establish reasonable short term and long term weight goals.;Obesity: Provide education and appropriate resources to help participant work on and attain dietary goals.    Admit Weight  235 lb 3.7 oz (106.7 kg)    Expected Outcomes Short Term: Continue to assess and modify interventions until short term weight is achieved;Long Term: Adherence to nutrition and physical activity/exercise program aimed toward attainment of established weight goal;Weight Maintenance: Understanding of the daily nutrition guidelines, which includes 25-35% calories from fat, 7% or less cal from saturated fats, less than 271m cholesterol, less than 1.5gm of sodium, & 5 or more servings of fruits and vegetables daily;Weight Loss: Understanding of general recommendations for a balanced deficit meal plan, which promotes 1-2 lb weight loss per week and includes a negative energy balance of [phone] kcal/d;Understanding recommendations for meals to include 15-35% energy as protein, 25-35% energy from fat, 35-60% energy from carbohydrates, less than 2040mof dietary cholesterol, 20-35 gm of total fiber  daily;Understanding of distribution of calorie intake throughout the day with the consumption of 4-5 meals/snacks    Diabetes Yes    Intervention Provide education about signs/symptoms and action to take for hypo/hyperglycemia.;Provide education about proper nutrition, including hydration, and aerobic/resistive exercise prescription along with prescribed medications to achieve blood glucose in normal ranges: Fasting glucose 65-99 mg/dL    Expected Outcomes Short Term: Participant verbalizes understanding of the signs/symptoms and immediate care of hyper/hypoglycemia, proper foot care and importance of medication, aerobic/resistive exercise and nutrition plan for blood glucose control.;Long Term: Attainment of HbA1C < 7%.    Heart Failure Yes    Intervention Provide a combined exercise and nutrition program that is supplemented with education, support and counseling about heart failure. Directed toward relieving symptoms such as shortness of breath, decreased exercise tolerance, and extremity edema.    Expected Outcomes Improve functional capacity of life;Short term: Attendance in program 2-3 days a week with increased exercise capacity. Reported lower sodium intake. Reported increased fruit and vegetable intake. Reports medication compliance.;Short term: Daily weights obtained and reported for increase. Utilizing diuretic protocols set by physician.;Long term: Adoption of self-care skills and reduction of barriers for early signs and symptoms recognition and intervention leading to self-care maintenance.    Hypertension Yes    Intervention Provide education on lifestyle modifcations including regular physical activity/exercise, weight management, moderate sodium restriction and increased consumption of fresh fruit, vegetables, and low fat dairy, alcohol moderation, and smoking cessation.;Monitor prescription use compliance.    Expected Outcomes Short Term: Continued assessment and intervention until BP is <  140/9081mG in hypertensive participants. < 130/36m67m in hypertensive participants with diabetes, heart failure or chronic kidney disease.;Long Term: Maintenance of blood pressure at goal levels.    Lipids Yes    Intervention Provide education and support for participant on nutrition & aerobic/resistive exercise along with prescribed medications to achieve LDL <70mg59mL >40mg.26mExpected Outcomes Short Term: Participant states understanding of desired cholesterol values and is compliant with medications prescribed. Participant is following exercise prescription and nutrition guidelines.;Long Term: Cholesterol controlled with medications as prescribed, with individualized exercise RX and with personalized nutrition plan. Value goals: LDL < 70mg, 81m> 40 mg.    Stress Yes    Intervention Offer individual and/or small group education and counseling on adjustment to heart disease, stress management and health-related lifestyle change. Teach and support self-help strategies.;Refer participants experiencing significant psychosocial distress to appropriate mental health specialists for further evaluation and treatment. When possible, include family members and significant others in education/counseling sessions.    Expected Outcomes Short Term: Participant demonstrates changes in health-related behavior, relaxation and other stress management skills, ability to obtain effective social support,  and compliance with psychotropic medications if prescribed.;Long Term: Emotional wellbeing is indicated by absence of clinically significant psychosocial distress or social isolation.             Core Components/Risk Factors/Patient Goals Review:   Goals and Risk Factor Review     Row Name 01/27/22 0840 02/18/22 1804           Core Components/Risk Factors/Patient Goals Review   Personal Goals Review Weight Management/Obesity;Lipids;Diabetes;Hypertension;Stress;Heart Failure Weight  Management/Obesity;Lipids;Diabetes;Hypertension;Stress;Heart Failure      Review [name] started intensive cardiac rehab on 01/24/22 and did well with exercise. Vital signs and CBG's were stable. [name] did look fatigued as he had dialysis earlier that day [name] is doing well with exercise.  Vital signs and CBG's were stable. [name] continues to look fatigued as her has dialysis attends cardiac rehab and goes to work. [name] has lost 2.2 kg since starting cardiac rehab      Expected Outcomes [name] will continue to participate in intensive cardiac rehab for exercise, nutrition and lifestyle modifications [name] will continue to participate in intensive cardiac rehab for exercise, nutrition and lifestyle modifications               Core Components/Risk Factors/Patient Goals at Discharge (Final Review):   Goals and Risk Factor Review - 02/18/22 1804       Core Components/Risk Factors/Patient Goals Review   Personal Goals Review Weight Management/Obesity;Lipids;Diabetes;Hypertension;Stress;Heart Failure    Review [name] is doing well with exercise.  Vital signs and CBG's were stable. [name] continues to look fatigued as her has dialysis attends cardiac rehab and goes to work. [name] has lost 2.2 kg since starting cardiac rehab    Expected Outcomes [name] will continue to participate in intensive cardiac rehab for exercise, nutrition and lifestyle modifications             ITP Comments:  ITP Comments     Row Name 01/16/22 1055 01/22/22 1232 02/18/22 1800       ITP Comments Dr [name] MD, Medical Director. Introduction to [facility] Education Program/ Intensive Cardiac Rehab. Initial Orientation Packet reviewed with the patient 30 day ITP Review. [name] is scheduled to begin exercise at intensive cardiac rehab on Friday 01/24/22. 30 day ITP Review. [name] has good attendance and participation in intensive cardiac rehab              Comments: See ITP comments.Harrell Gave  RN BSN

## 2022-02-19 ENCOUNTER — Encounter (HOSPITAL_COMMUNITY)
Admission: RE | Admit: 2022-02-19 | Discharge: 2022-02-19 | Disposition: A | Discharge status: Home / Self Care | Payer: Medicare Other | Source: Ambulatory Visit | Attending: Cardiology | Admitting: Cardiology

## 2022-02-19 DIAGNOSIS — I2089 Other forms of angina pectoris: Secondary | ICD-10-CM

## 2022-02-19 DIAGNOSIS — Z955 Presence of coronary angioplasty implant and graft: Secondary | ICD-10-CM

## 2022-02-21 ENCOUNTER — Encounter (HOSPITAL_COMMUNITY)
Admission: RE | Admit: 2022-02-21 | Discharge: 2022-02-21 | Disposition: A | Discharge status: Home / Self Care | Payer: Medicare Other | Source: Ambulatory Visit | Attending: Cardiology | Admitting: Cardiology

## 2022-02-21 DIAGNOSIS — I2089 Other forms of angina pectoris: Secondary | ICD-10-CM

## 2022-02-21 DIAGNOSIS — Z955 Presence of coronary angioplasty implant and graft: Secondary | ICD-10-CM | POA: Diagnosis not present

## 2022-02-21 LAB — GLUCOSE, CAPILLARY: Glucose-Capillary: 134 mg/dL — ABNORMAL HIGH (ref 70–99)

## 2022-02-24 ENCOUNTER — Encounter (HOSPITAL_COMMUNITY)
Admission: RE | Admit: 2022-02-24 | Discharge: 2022-02-24 | Disposition: A | Discharge status: Home / Self Care | Payer: Medicare Other | Source: Ambulatory Visit | Attending: Cardiology | Admitting: Cardiology

## 2022-02-24 DIAGNOSIS — Z955 Presence of coronary angioplasty implant and graft: Secondary | ICD-10-CM | POA: Diagnosis not present

## 2022-02-24 DIAGNOSIS — I2089 Other forms of angina pectoris: Secondary | ICD-10-CM

## 2022-02-26 ENCOUNTER — Encounter (HOSPITAL_COMMUNITY)
Admission: RE | Admit: 2022-02-26 | Discharge: 2022-02-26 | Disposition: A | Discharge status: Home / Self Care | Payer: Medicare Other | Source: Ambulatory Visit | Attending: Cardiology | Admitting: Cardiology

## 2022-02-26 DIAGNOSIS — Z955 Presence of coronary angioplasty implant and graft: Secondary | ICD-10-CM

## 2022-02-26 DIAGNOSIS — I2089 Other forms of angina pectoris: Secondary | ICD-10-CM

## 2022-02-28 ENCOUNTER — Encounter (HOSPITAL_COMMUNITY)
Admission: RE | Admit: 2022-02-28 | Discharge: 2022-02-28 | Disposition: A | Discharge status: Home / Self Care | Payer: Medicare Other | Source: Ambulatory Visit | Attending: Cardiology | Admitting: Cardiology

## 2022-02-28 DIAGNOSIS — I2089 Other forms of angina pectoris: Secondary | ICD-10-CM

## 2022-02-28 DIAGNOSIS — Z955 Presence of coronary angioplasty implant and graft: Secondary | ICD-10-CM | POA: Diagnosis not present

## 2022-03-03 ENCOUNTER — Encounter (HOSPITAL_COMMUNITY)
Admission: RE | Admit: 2022-03-03 | Discharge: 2022-03-03 | Disposition: A | Discharge status: Home / Self Care | Payer: Medicare Other | Source: Ambulatory Visit | Attending: Cardiology | Admitting: Cardiology

## 2022-03-03 DIAGNOSIS — I2089 Other forms of angina pectoris: Secondary | ICD-10-CM

## 2022-03-03 DIAGNOSIS — Z955 Presence of coronary angioplasty implant and graft: Secondary | ICD-10-CM

## 2022-03-03 NOTE — Progress Notes (Signed)
Incomplete Session Note  Patient Details  Name: [name] MRN: [id number] Date of Birth: 02/08/1976 Referring Provider:   Flowsheet Row INTENSIVE CARDIAC REHAB ORIENT from 01/16/2022 in [facility]  Referring Provider [name], MD ([name], MD - covering)       [name] did not complete his rehab session.  [name] did not exercise today as he did not eat today and did not check his CBG. No exercise per protocol.  Patient was given peanut butter and graham crackers. [name] plans to go home and eat leftovers. [name] plans to return to exercise on Wednesday if he does not pick up an extra shift at work.Barnet Pall, RN,BSN 03/03/2022 1:23 PM

## 2022-03-05 ENCOUNTER — Encounter (HOSPITAL_COMMUNITY)
Admission: RE | Admit: 2022-03-05 | Discharge: 2022-03-05 | Disposition: A | Discharge status: Home / Self Care | Payer: Medicare Other | Source: Ambulatory Visit | Attending: Cardiology | Admitting: Cardiology

## 2022-03-05 DIAGNOSIS — Z955 Presence of coronary angioplasty implant and graft: Secondary | ICD-10-CM | POA: Diagnosis not present

## 2022-03-05 DIAGNOSIS — I2089 Other forms of angina pectoris: Secondary | ICD-10-CM

## 2022-03-07 ENCOUNTER — Encounter (HOSPITAL_COMMUNITY): Payer: Medicare Other

## 2022-03-10 ENCOUNTER — Encounter (HOSPITAL_COMMUNITY)
Admission: RE | Admit: 2022-03-10 | Discharge: 2022-03-10 | Disposition: A | Discharge status: Home / Self Care | Payer: Medicare Other | Source: Ambulatory Visit | Attending: Cardiology | Admitting: Cardiology

## 2022-03-10 DIAGNOSIS — Z955 Presence of coronary angioplasty implant and graft: Secondary | ICD-10-CM | POA: Diagnosis not present

## 2022-03-12 ENCOUNTER — Encounter (HOSPITAL_COMMUNITY)
Admission: RE | Admit: 2022-03-12 | Discharge: 2022-03-12 | Disposition: A | Discharge status: Home / Self Care | Payer: Medicare Other | Source: Ambulatory Visit | Attending: Cardiology | Admitting: Cardiology

## 2022-03-12 DIAGNOSIS — Z955 Presence of coronary angioplasty implant and graft: Secondary | ICD-10-CM

## 2022-03-12 DIAGNOSIS — I2089 Other forms of angina pectoris: Secondary | ICD-10-CM

## 2022-03-14 ENCOUNTER — Encounter (HOSPITAL_COMMUNITY)
Admission: RE | Admit: 2022-03-14 | Discharge: 2022-03-14 | Disposition: A | Discharge status: Home / Self Care | Payer: Medicare Other | Source: Ambulatory Visit | Attending: Cardiology | Admitting: Cardiology

## 2022-03-14 DIAGNOSIS — I2089 Other forms of angina pectoris: Secondary | ICD-10-CM | POA: Diagnosis not present

## 2022-03-14 DIAGNOSIS — Z955 Presence of coronary angioplasty implant and graft: Secondary | ICD-10-CM | POA: Diagnosis present

## 2022-03-17 ENCOUNTER — Encounter (HOSPITAL_COMMUNITY)
Admission: RE | Admit: 2022-03-17 | Discharge: 2022-03-17 | Disposition: A | Discharge status: Home / Self Care | Payer: Medicare Other | Source: Ambulatory Visit | Attending: Cardiology | Admitting: Cardiology

## 2022-03-17 VITALS — Ht 66.5 in | Wt 240.5 lb

## 2022-03-17 DIAGNOSIS — Z955 Presence of coronary angioplasty implant and graft: Secondary | ICD-10-CM

## 2022-03-17 DIAGNOSIS — I2089 Other forms of angina pectoris: Secondary | ICD-10-CM | POA: Diagnosis not present

## 2022-03-18 NOTE — Progress Notes (Signed)
Cardiac Individual Treatment Plan  Patient Details  Name: BADER STUBBLEFIELD MRN: [id number] Date of Birth: 09-25-1975 Referring Provider:   Flowsheet Row INTENSIVE CARDIAC REHAB ORIENT from 01/16/2022 in [facility] [place]  Referring Provider [name], MD [name], MD - covering)       Initial Encounter Date:  [place] from 01/16/2022 in [facility]  Date 01/16/22       Visit Diagnosis: 10/08/21 S/P PCI/DES SVG OM1  Patient's Home Medications on Admission:  Current Outpatient Medications:    acetaminophen (TYLENOL) 500 MG tablet, Take 500-1,000 mg by mouth every 6 (six) hours as needed for moderate pain or mild pain., Disp: , Rfl:    anastrozole (ARIMIDEX) 1 MG tablet, Take 1 mg by mouth every other day., Disp: , Rfl:    aspirin 81 MG EC tablet, Take 81 mg by mouth daily., Disp: , Rfl:    atorvastatin (LIPITOR) 40 MG tablet, Take 40 mg by mouth at bedtime., Disp: , Rfl:    blood glucose meter kit and supplies KIT, Dispense based on patient and insurance preference. Use QD-BID home glucose monitoring. (FOR ICD-9 250.00, 250.01)., Disp: 1 each, Rfl: 0   carvedilol (COREG) 25 MG tablet, Take 25 mg by mouth 2 (two) times daily with a meal., Disp: , Rfl:    Cholecalciferol (VITAMIN D3) 125 MCG (5000 UT) TABS, Take 5,000 Units by mouth daily., Disp: , Rfl:    cinacalcet (SENSIPAR) 90 MG tablet, Take 90 mg by mouth every Monday, Wednesday, and Friday., Disp: , Rfl:    clomiPHENE (CLOMID) 50 MG tablet, Take 50 mg by mouth every other day., Disp: , Rfl:    ezetimibe (ZETIA) 10 MG tablet, Take 10 mg by mouth at bedtime., Disp: , Rfl:    gabapentin (NEURONTIN) 100 MG capsule, Take 100 mg by mouth 3 (three) times daily as needed (Nerve pain)., Disp: , Rfl:    glimepiride (AMARYL) 1 MG tablet, Take 1 mg by mouth daily., Disp: , Rfl:     icosapent Ethyl (VASCEPA) 1 g capsule, Take 2 g by mouth 2 (two) times daily., Disp: , Rfl:    lanthanum (FOSRENOL) 1000 MG chewable tablet, Chew 2,000 mg by mouth 3 (three) times daily with meals., Disp: , Rfl:    lidocaine-prilocaine (EMLA) cream, Apply 1 Application topically as needed (on port)., Disp: , Rfl:    nitroGLYCERIN (NITROSTAT) 0.4 MG SL tablet, DISSOLVE ONE TABLET UNDER THE TONGUE EVERY 5 MINUTES AS NEEDED FOR CHEST PAIN.  DO NOT EXCEED A TOTAL OF 3 DOSES IN 15 MINUTES (Patient taking differently: Place 0.4 mg under the tongue every 5 (five) minutes as needed for chest pain.), Disp: 25 tablet, Rfl: 3   REPATHA SURECLICK 115 MG/ML SOAJ, Inject 140 mg into the skin every 14 (fourteen) days., Disp: , Rfl:    tadalafil (CIALIS) 10 MG tablet, Take 10 mg by mouth every morning., Disp: , Rfl:    ticagrelor (BRILINTA) 90 MG TABS tablet, Take 90 mg by mouth 2 (two) times daily., Disp: , Rfl:    TRULICITY 1.5 ZM/0.8YE SOPN, Inject 1.5 mg into the skin once a week., Disp: , Rfl:   Past Medical History: Past Medical History:  Diagnosis Date   CAD (coronary artery disease)    CHF (congestive heart failure) (HCC)    Chronic kidney disease    Diabetes mellitus  Heart murmur    " slight"   Hyperlipidemia    Hypertension    Sleep apnea    wears CPAP    Tobacco Use: Social History   Tobacco Use  Smoking Status Former   Types: Cigarettes   Quit date: 09/17/2008   Years since quitting: 13.5  Smokeless Tobacco Never    Labs: Review Flowsheet  More data exists      Latest Ref Rng & Units 03/25/2015 01/01/2016 08/18/2016 07/06/2017 08/27/2021  Labs for ITP Cardiac and Pulmonary Rehab  Cholestrol 0 - 200 mg/dL - - 195  104  -  LDL (calc) 0 - 99 mg/dL - - 109  55  -  HDL-C >40 mg/dL - - 38  31  -  Trlycerides <150 mg/dL - - 240  88  -  Hemoglobin A1c 4.8 - 5.6 % 13.4  10.6  7.5  6.5  -  PH, Arterial 7.350 - 7.450 7.348  - - - -  PCO2 arterial 35.0 - 45.0 mmHg 40.0  - - - -   Bicarbonate 20.0 - 24.0 mEq/L 22.2  - - - -  TCO2 22 - 32 mmol/L 23  - - - 31   Acid-base deficit 0.0 - 2.0 mmol/L 3.0  - - - -  O2 Saturation % 97.0  - - - -    Capillary Blood Glucose: Lab Results  Component Value Date   GLUCAP 134 (H) 02/21/2022   GLUCAP 165 (H) 02/17/2022   GLUCAP 128 (H) 02/14/2022   GLUCAP 204 (H) 02/05/2022   GLUCAP 195 (H) 02/03/2022     Exercise Target Goals: Exercise Program Goal: Individual exercise prescription set using results from initial 6 min walk test and THRR while considering  patient's activity barriers and safety.   Exercise Prescription Goal: Initial exercise prescription builds to 30-45 minutes a day of aerobic activity, 2-3 days per week.  Home exercise guidelines will be given to patient during program as part of exercise prescription that the participant will acknowledge.  Activity Barriers & Risk Stratification:  Activity Barriers & Cardiac Risk Stratification - 01/16/22 1157       Activity Barriers & Cardiac Risk Stratification   Activity Barriers Back Problems;Deconditioning;Muscular Weakness;Decreased Ventricular Function;Balance Concerns    Cardiac Risk Stratification High             6 Minute Walk:  6 Minute Walk     Row Name 01/16/22 1056 03/10/22 1255       6 Minute Walk   Phase Initial Discharge    Distance 1328 feet 1294 feet    Distance % Change -- -2.56 %    Distance Feet Change -- -34 ft    Walk Time 6 minutes 6 minutes    # of Rest Breaks 0 0    MPH 12.28 2.52    METS 3.51 3.79    RPE 11 10    Perceived Dyspnea  0 0    VO2 Peak 12.28 13.25    Symptoms Yes (comment) No    Comments Left calf pain 2/10 --    Resting HR 83 bpm 86 bpm    Resting BP 122/72 128/80    Resting Oxygen Saturation  98 % --    Exercise Oxygen Saturation  during 6 min walk 97 % --    Max Ex. HR 91 bpm 105 bpm    Max Ex. BP 126/81 158/82    2 Minute Post BP 123/83 --  Oxygen Initial Assessment:   Oxygen  Re-Evaluation:   Oxygen Discharge (Final Oxygen Re-Evaluation):   Initial Exercise Prescription:  Initial Exercise Prescription - 01/16/22 1200       Date of Initial Exercise RX and Referring Provider   Date 01/16/22    Referring Provider [name], MD [name], MD - covering)    Expected Discharge Date 03/14/22      Recumbant Bike   Level 2    Watts 50    Minutes 15    METs 3.5      NuStep   Level 2    SPM 80    Minutes 15    METs 3.5      Prescription Details   Frequency (times per week) 3    Duration Progress to 30 minutes of continuous aerobic without signs/symptoms of physical distress      Intensity   THRR 40-80% of Max Heartrate 69-138    Ratings of Perceived Exertion 11-13    Perceived Dyspnea 0-4      Progression   Progression Continue to progress workloads to maintain intensity without signs/symptoms of physical distress.      Resistance Training   Training Prescription Yes    Weight 5 lbs    Reps 10-15             Perform Capillary Blood Glucose checks as needed.  Exercise Prescription Changes:   Exercise Prescription Changes     Row Name 01/24/22 1400 02/05/22 1400 02/17/22 1600 02/19/22 1600 03/14/22 1500     Response to Exercise   Blood Pressure (Admit) 110/80 134/83 112/70 123/82 120/74   Blood Pressure (Exercise) 112/78 137/81 108/66 148/85 112/72   Blood Pressure (Exit) 120/68 104/70 102/68 117/80 137/72   Heart Rate (Admit) 94 bpm 90 bpm 94 bpm 92 bpm 85 bpm   Heart Rate (Exercise) 106 bpm 101 bpm 112 bpm 109 bpm 103 bpm   Heart Rate (Exit) 93 bpm 89 bpm 94 bpm 98 bpm 94 bpm   Rating of Perceived Exertion (Exercise) _0 11.5   Symptoms Fatigue after dialysis today None None None None   Comments Pt's first day in the CRP2 program Reviewed METs Reviewed METs and goals Reviewed Home exercise Rx Reviewed METs   Duration Progress to 30 minutes of  aerobic without signs/symptoms of physical distress Progress to 30  minutes of  aerobic without signs/symptoms of physical distress Progress to 30 minutes of  aerobic without signs/symptoms of physical distress Progress to 30 minutes of  aerobic without signs/symptoms of physical distress Continue with 30 min of aerobic exercise without signs/symptoms of physical distress.   Intensity _1      Progression   Progression Continue to progress workloads to maintain intensity without signs/symptoms of physical distress. Continue to progress workloads to maintain intensity without signs/symptoms of physical distress. Continue to progress workloads to maintain intensity without signs/symptoms of physical distress. Continue to progress workloads to maintain intensity without signs/symptoms of physical distress. Continue to progress workloads to maintain intensity without signs/symptoms of physical distress.   Average METs 1.6 2.1 2.1 2.4 2.55     Resistance Training   Training Prescription Yes No Yes -- Yes   Weight 3 lbs No weights on Wednesdays 3 lbs No weights on wednesdays 3 lbs   Reps 10-15 -- 10-15 -- 10-15   Time 10 Minutes -- 10 Minutes -- 10 Minutes     Interval Training  Interval Training _0      Recumbant Bike   Level 2 -- -- -- --   Minutes 5 -- -- -- --   METs 1.5 -- -- -- --     NuStep   Level _1 --   SPM 84 -- 86 -- --   Minutes _2 --   METs 1.7 2.1 2.1 2.4 --     Biostep-RELP   Level -- -- -- -- 1   Minutes -- -- -- -- 15   METs -- -- -- -- 2.4     Track   Laps -- -- -- 8 24   Minutes -- -- -- 10 15   METs -- -- -- 1.56 2.67     Home Exercise Plan   Plans to continue exercise at -- -- -- [employer] (comment) Forensic scientist (comment)   Frequency -- -- -- Add 2 additional days to program exercise sessions. Add 2 additional days to program exercise sessions.   Initial Home Exercises Provided -- -- -- 02/19/22 02/19/22             Exercise Comments:   Exercise Comments     Row Name 01/24/22 1415 02/05/22 1409 02/17/22 1612 02/19/22 1626 03/14/22 1602   Exercise Comments Pt's first day in the CRP2 program. Pt had dialysis before coming to exercise and was somewhat fatigued. Pt was able to complete session with no complaints other than feeling fatigued. Reviewed METs. Pt improving on MET level. Reviewed METs and goals. Increased to level 2 on Nustep. Pt comes to CR after dialysis. Some days the patient is more fatigued based upon how his dialysis goes. Reviewed home exercise Rx today. Pt verbalizes understanding. Pt encouraged to try to add some exercise days at home to help improve strength and stamina. Pt exercises on his dialysis days and then goes to work after the [place] program. Off days are recovery but enouraged to try to add 2 exercise days. Pt states that he and his wife might join [employer] so he can exercise there. Reviewed METs. Slow progression. Added recumbent elliptical stepper and discontinued Nustep, it was bothering the patient's legs.            Exercise Goals and Review:   Exercise Goals     Row Name 01/16/22 1158             Exercise Goals   Increase Physical Activity Yes       Intervention Provide advice, education, support and counseling about physical activity/exercise needs.;Develop an individualized exercise prescription for aerobic and resistive training based on initial evaluation findings, risk stratification, comorbidities and participant's personal goals.       Expected Outcomes Short Term: Attend rehab on a regular basis to increase amount of physical activity.;Long Term: Add in home exercise to make exercise part of routine and to increase amount of physical activity.;Long Term: Exercising regularly at least 3-5 days a week.       Increase Strength and Stamina Yes       Intervention Provide advice, education, support and counseling about physical activity/exercise  needs.;Develop an individualized exercise prescription for aerobic and resistive training based on initial evaluation findings, risk stratification, comorbidities and participant's personal goals.       Expected Outcomes Short Term: Increase workloads from initial exercise prescription for resistance, speed, and METs.;Short Term: Perform resistance training exercises routinely during rehab and add in resistance training at home;Long Term:  Improve cardiorespiratory fitness, muscular endurance and strength as measured by increased METs and functional capacity (6MWT)       Able to understand and use rate of perceived exertion (RPE) scale Yes       Intervention Provide education and explanation on how to use RPE scale       Expected Outcomes Short Term: Able to use RPE daily in rehab to express subjective intensity level;Long Term:  Able to use RPE to guide intensity level when exercising independently       Knowledge and understanding of Target Heart Rate Range (THRR) Yes       Intervention Provide education and explanation of THRR including how the numbers were predicted and where they are located for reference       Expected Outcomes Short Term: Able to state/look up THRR;Short Term: Able to use daily as guideline for intensity in rehab;Long Term: Able to use THRR to govern intensity when exercising independently       Understanding of Exercise Prescription Yes       Intervention Provide education, explanation, and written materials on patient's individual exercise prescription       Expected Outcomes Short Term: Able to explain program exercise prescription;Long Term: Able to explain home exercise prescription to exercise independently                Exercise Goals Re-Evaluation :  Exercise Goals Re-Evaluation     [place] Name 01/24/22 1414 02/17/22 1608           Exercise Goal Re-Evaluation   Exercise Goals Review Increase Physical Activity;Increase Strength and Stamina;Able to understand and  use rate of perceived exertion (RPE) scale;Knowledge and understanding of Target Heart Rate Range (THRR);Understanding of Exercise Prescription Increase Physical Activity;Increase Strength and Stamina;Able to understand and use rate of perceived exertion (RPE) scale;Knowledge and understanding of Target Heart Rate Range (THRR);Understanding of Exercise Prescription      Comments Pt's first day in the CRP2 program. Pt understands his exercise Rx, THRR and RPE scale. Reviewed METs and goals. Pt voices he still gets SOB going up stairs. Pt takes breaks during the nustep to rest.      Expected Outcomes Will continue to monitor and progress exercise workloads as tolerated. Will continue to monitor and progress exercise workloads as tolerated.               Discharge Exercise Prescription (Final Exercise Prescription Changes):  Exercise Prescription Changes - 03/14/22 1500       Response to Exercise   Blood Pressure (Admit) 120/74    Blood Pressure (Exercise) 112/72    Blood Pressure (Exit) 137/72    Heart Rate (Admit) 85 bpm    Heart Rate (Exercise) 103 bpm    Heart Rate (Exit) 94 bpm    Rating of Perceived Exertion (Exercise) 11.5    Symptoms None    Comments Reviewed METs    Duration Continue with 30 min of aerobic exercise without signs/symptoms of physical distress.    Intensity THRR unchanged      Progression   Progression Continue to progress workloads to maintain intensity without signs/symptoms of physical distress.    Average METs 2.55      Resistance Training   Training Prescription Yes    Weight 3 lbs    Reps 10-15    Time 10 Minutes      Interval Training   Interval Training No      Biostep-RELP   Level 1  Minutes 15    METs 2.4      Track   Laps 24    Minutes 15    METs 2.67      Home Exercise Plan   Plans to continue exercise at [facility] (comment)    Frequency Add 2 additional days to program exercise sessions.    Initial Home Exercises  Provided 02/19/22             Nutrition:  Target Goals: Understanding of nutrition guidelines, daily intake of sodium <1553m, cholesterol <2073m calories 30% from fat and 7% or less from saturated fats, daily to have 5 or more servings of fruits and vegetables.  Biometrics:  Pre Biometrics - 01/16/22 1000       Pre Biometrics   Waist Circumference 46.5 inches    Hip Circumference 44.25 inches    Waist to Hip Ratio 1.05 %    Triceps Skinfold 18 mm    % Body Fat 34.6 %    Grip Strength 30 kg    Flexibility 0 in   Unable to reach   Single Leg Stand 7.43 seconds             Post Biometrics - 03/17/22 1602        Post  Biometrics   Height 5' 6.5" (1.689 m)    Weight 109.1 kg    Waist Circumference 47.25 inches    Hip Circumference 44 inches    Waist to Hip Ratio 1.07 %    BMI (Calculated) 38.24    Triceps Skinfold 19 mm    % Body Fat 35.5 %    Grip Strength 30 kg    Flexibility 9.75 in    Single Leg Stand 7.87 seconds             Nutrition Therapy Plan and Nutrition Goals:  Nutrition Therapy & Goals - 02/24/22 1402       Nutrition Therapy   Diet Heart Healthy/Carbohydrate Consistent/Renal diet    Drug/Food Interactions Statins/Certain Fruits      Personal Nutrition Goals   Nutrition Goal Patient to identify strategies for managing cardiovascular risk by attending the weekly Pritikin education and nutition series    Personal Goal #2 Patient to reduce sodium to <150044mer day.    Personal Goal #3 Patient to identify food sources and limit daily intake of saturated fat, trans fat, sodium, and refined carbohydrates.    Comments LamVincente Libertyports contemplation of nutrition goals. LamVincente Libertyntinues to attend the Pritikin education and nutrition series. LamVincente Libertyntinues dialysis Monday/Wednesday/Friday. His A1c remains well controlled (<7%) at 6.6%. He continues to eat out 2-3x per day due to busy work and dialysis schedule and overall fatigue from dialysis.  He  continues to work toward compliance of the [place] eating plan to aid with reading food labels for sodium and sugar and reducing fried foods, red meat, and sugary drinks. He has maintained his weight since starting with our program. Although he continues eating out regularly, he is trying to make healthier choices to include grilled meats, fruits/vegetables, and water. He continues regular follow-up with sleep medicine for BiPAP.      Intervention Plan   Intervention Prescribe, educate and counsel regarding individualized specific dietary modifications aiming towards targeted core components such as weight, hypertension, lipid management, diabetes, heart failure and other comorbidities.;Nutrition handout(s) given to patient.    Expected Outcomes Short Term Goal: Understand basic principles of dietary content, such as calories, fat, sodium, cholesterol and nutrients.;Short Term Goal:  A plan has been developed with personal nutrition goals set during dietitian appointment.;Long Term Goal: Adherence to prescribed nutrition plan.             Nutrition Assessments:  MEDIFICTS Score Key: ?70 Need to make dietary changes  40-70 Heart Healthy Diet ? 40 Therapeutic Level Cholesterol Diet   Flowsheet Row CARDIAC REHAB PHASE II EXERCISE from 12/24/2020 in [facility], V[facility], & Lung [facility]  Picture Your Plate Total Score on Discharge 66      Picture Your Plate Scores: <81 Unhealthy dietary pattern with much room for improvement. 41-50 Dietary pattern unlikely to meet recommendations for good health and room for improvement. 51-60 More healthful dietary pattern, with some room for improvement.  >60 Healthy dietary pattern, although there may be some specific behaviors that could be improved.    Nutrition Goals Re-Evaluation:  Nutrition Goals Re-Evaluation     [place] Name 01/24/22 1400 02/24/22 1402           Goals   Current Weight 239 lb 13.8 oz (108.8 kg)  235 lb 7.2 oz (106.8 kg)      Comment Triglycerides 247, A1c 6.6; history of anemia related to chronic renal failure. No new labs at this time.      Expected Outcome Lamont completes dialysis Monday/Wednesday/Friday. 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 They do the grocery shopping and cooking together. He does work out with a Physiological scientist Tuesday and Thursday. His A1c is improved to 6.6 since starting Trulicity. He reports inconsistencies in wearing his CPAP due to the mask. Due to his busy schedule, he is interested in learning more about healthier convenience food options. We discussed reading food labels for sodium, reading sodium intake to <1529m/day. Gave resources Pritikin Modifications for Kidney Disease and Healthy Convenience foods. Answered questions about deli meat. He has completed cardiac rehab two other times and does have access to a RD at dialysis. He continues follow-up with nephrology and transplant team. LVincente Libertyreports contemplation of nutrition goals. LVincente Libertycontinues to attend the Pritikin education and nutrition series. LVincente Libertycontinues dialysis Monday/Wednesday/Friday. His A1c remains well controlled (<7%) at 6.6%. He continues to eat out 2-3x per day due to busy work and dialysis schedule and overall fatigue from dialysis. He continues to work toward compliance of the [place] eating plan to aid with reading food labels for sodium and sugar and reducing fried foods, red meat, and sugary drinks. He has maintained his weight since starting with our program. Although he continues eating out regularly, he is trying to make healthier choices to include grilled meats, fruits/vegetables, and water. He continues regular follow-up with sleep medicine for BiPAP.               Nutrition Goals Re-Evaluation:  Nutrition Goals Re-Evaluation     [place] Lake Acres[place] 01/24/22 1400 02/24/22 1402           Goals   Current Weight  239 lb 13.8 oz (108.8 kg) 235 lb 7.2 oz (106.8 kg)      Comment Triglycerides 247, A1c 6.6; history of anemia related to chronic renal failure. No new labs at this time.      Expected Outcome Lamont completes dialysis Monday/Wednesday/Friday. 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 They do the grocery shopping and cooking together. He does work out with a pPhysiological scientistTuesday  and Thursday. His A1c is improved to 6.6 since starting Trulicity. He reports inconsistencies in wearing his CPAP due to the mask. Due to his busy schedule, he is interested in learning more about healthier convenience food options. We discussed reading food labels for sodium, reading sodium intake to <15106m/day. Gave resources Pritikin Modifications for Kidney Disease and Healthy Convenience foods. Answered questions about deli meat. He has completed cardiac rehab two other times and does have access to a RD at dialysis. He continues follow-up with nephrology and transplant team. LVincente Libertyreports contemplation of nutrition goals. LVincente Libertycontinues to attend the Pritikin education and nutrition series. LVincente Libertycontinues dialysis Monday/Wednesday/Friday. His A1c remains well controlled (<7%) at 6.6%. He continues to eat out 2-3x per day due to busy work and dialysis schedule and overall fatigue from dialysis. He continues to work toward compliance of the [place] eating plan to aid with reading food labels for sodium and sugar and reducing fried foods, red meat, and sugary drinks. He has maintained his weight since starting with our program. Although he continues eating out regularly, he is trying to make healthier choices to include grilled meats, fruits/vegetables, and water. He continues regular follow-up with sleep medicine for BiPAP.               Nutrition Goals Discharge (Final Nutrition Goals Re-Evaluation):  Nutrition Goals Re-Evaluation - 02/24/22 1402        Goals   Current Weight 235 lb 7.2 oz (106.8 kg)    Comment No new labs at this time.    Expected Outcome Lamont reports contemplation of nutrition goals. LVincente Libertycontinues to attend the Pritikin education and nutrition series. LVincente Libertycontinues dialysis Monday/Wednesday/Friday. His A1c remains well controlled (<7%) at 6.6%. He continues to eat out 2-3x per day due to busy work and dialysis schedule and overall fatigue from dialysis. He continues to work toward compliance of the [place] eating plan to aid with reading food labels for sodium and sugar and reducing fried foods, red meat, and sugary drinks. He has maintained his weight since starting with our program. Although he continues eating out regularly, he is trying to make healthier choices to include grilled meats, fruits/vegetables, and water. He continues regular follow-up with sleep medicine for BiPAP.             Psychosocial: Target Goals: Acknowledge presence or absence of significant depression and/or stress, maximize coping skills, provide positive support system. Participant is able to verbalize types and ability to use techniques and skills needed for reducing stress and depression.  Initial Review & Psychosocial Screening:  Initial Psych Review & Screening - 01/16/22 1206       Initial Review   Current issues with None Identified      Family Dynamics   Good Support System? Yes   LVincente Libertyhas his wife, friend and cousins for support     Barriers   Psychosocial barriers to participate in program There are no identifiable barriers or psychosocial needs.      Screening Interventions   Interventions Encouraged to exercise;Provide feedback about the scores to participant    Expected Outcomes Short Term goal: Identification and review with participant of any Quality of Life or Depression concerns found by scoring the questionnaire.;Long Term goal: The participant improves quality of Life and PHQ9 Scores as seen by post scores  and/or verbalization of changes             Quality of Life Scores:  Quality of Life - 01/16/22 1152  Quality of Life   Select Quality of Life      Quality of Life Scores   Health/Function Pre 21.96 %    Socioeconomic Pre 17.21 %    Psych/Spiritual Pre 21.43 %    Family Pre 21 %    GLOBAL Pre 20.69 %            Scores of 19 and below usually indicate a poorer quality of life in these areas.  A difference of  2-3 points is a clinically meaningful difference.  A difference of 2-3 points in the total score of the Quality of Life Index has been associated with significant improvement in overall quality of life, self-image, physical symptoms, and general health in studies assessing change in quality of life.  PHQ-9: Review Flowsheet  More data exists      01/16/2022 12/24/2020 11/01/2020 08/29/2016 08/18/2016  Depression screen PHQ 2/9  Decreased Interest 0 0 0 0 0  Down, Depressed, Hopeless 0 0 0 0 0  PHQ - 2 Score 0 0 0 0 0   Interpretation of Total Score  Total Score Depression Severity:  1-4 = Minimal depression, 5-9 = Mild depression, 10-14 = Moderate depression, 15-19 = Moderately severe depression, 20-27 = Severe depression   Psychosocial Evaluation and Intervention:   Psychosocial Re-Evaluation:  Psychosocial Re-Evaluation     [place] Name 01/24/22 1646 02/18/22 1801 03/18/22 1610         Psychosocial Re-Evaluation   Current issues with Current Stress Concerns Current Stress Concerns Current Stress Concerns     Comments [place] does not voice any concerns on his first day of exercise [name] has not voiced any increased concerns or stressors despite attending cardiac rehab on his HD days . [name] has not voiced any increased concerns or stressors despite attending cardiac rehab on his HD days . [name] will complete intensive cardiac rehab on 03/19/22     Expected Outcomes [place] will have less  or controlled stress upon completion of intensive cardiac rehab [place]  will have less  or controlled stress upon completion of intensive cardiac rehab [place] will have less  or controlled stress upon completion of intensive cardiac rehab     Interventions Encouraged to attend Cardiac Rehabilitation for the exercise Encouraged to attend Cardiac Rehabilitation for the exercise Encouraged to attend Cardiac Rehabilitation for the exercise     Continue Psychosocial Services  No Follow up required No Follow up required No Follow up required       Initial Review   Source of Stress Concerns Chronic Illness Chronic Illness Chronic Illness     Comments Will continue to monitor and offer support as needed Will continue to monitor and offer support as needed Will continue to monitor and offer support as needed              Psychosocial Discharge (Final Psychosocial Re-Evaluation):  Psychosocial Re-Evaluation - 03/18/22 1610       Psychosocial Re-Evaluation   Current issues with Current Stress Concerns    Comments [name] has not voiced any increased concerns or stressors despite attending cardiac rehab on his HD days . [name] will complete intensive cardiac rehab on 03/19/22    Expected Outcomes [place] will have less  or controlled stress upon completion of intensive cardiac rehab    Interventions Encouraged to attend Cardiac Rehabilitation for the exercise    Continue Psychosocial Services  No Follow up required      Initial Review   Source of Stress Concerns Chronic  Illness    Comments Will continue to monitor and offer support as needed             Vocational Rehabilitation: Provide vocational rehab assistance to qualifying candidates.   Vocational Rehab Evaluation & Intervention:  Vocational Rehab - 01/16/22 1209       Initial Vocational Rehab Evaluation & Intervention   Assessment shows need for Vocational Rehabilitation No   [name] works full time and does not need vocational rehab at this time            Education: Education Goals: Education  classes will be provided on a weekly basis, covering required topics. Participant will state understanding/return demonstration of topics presented.    Education     Row Name 01/24/22 1600     Education   Cardiac Education Topics Pritikin   Tax inspector Psychosocial   Psychosocial How Our Thoughts Can Heal Our Hearts   Instruction Review Code 1- Verbalizes Understanding   Class Start Time 1358   Class Stop Time 1436   Class Time Calculation (min) 38 min    [place] Name 01/29/22 1500     Education   Cardiac Education Topics Pritikin   Financial trader   Weekly Topic Adding Flavor - Sodium-Free   Instruction Review Code 1- Verbalizes Understanding   Class Start Time 1355   Class Stop Time 1440   Class Time Calculation (min) 45 min    [place] Name 02/03/22 1500     Education   Cardiac Education Topics Pritikin   Select Workshops     Workshops   Educator Exercise Physiologist   Select Psychosocial   Psychosocial Workshop Recognizing and Reducing Stress   Instruction Review Code 1- Verbalizes Understanding   Class Start Time 1350   Class Stop Time 1448   Class Time Calculation (min) 58 min    [place] Name 02/05/22 1500     Education   Cardiac Education Topics Pritikin   Financial trader   Weekly Topic Fast and Healthy Breakfasts   Instruction Review Code 1- Verbalizes Understanding   Class Start Time 1350   Class Stop Time 1434   Class Time Calculation (min) 44 min    [place] Name 02/12/22 1500     Education   Cardiac Education Topics Pritikin   Financial trader   Weekly Topic Personalizing Your Pritikin Plate   Instruction Review Code 1- Verbalizes Understanding   Class Start Time 1610   Class Stop Time 1435   Class Time Calculation (min) 40 min    [place] Name 02/17/22 1500      Education   Cardiac Education Topics Pritikin   IT sales professional Nutrition   Nutrition Workshop Label Reading   Instruction Review Code 1- Verbalizes Understanding   Class Start Time 1355   Class Stop Time 1445   Class Time Calculation (min) 50 min    [place] Name 02/19/22 1500     Education   Cardiac Education Topics Pritikin   Financial trader   Weekly Topic Tasty Appetizers and Snacks   Instruction Review Code 1- Verbalizes Understanding   Class  Start Time 1355   Class Stop Time 1434   Class Time Calculation (min) 39 min    [place] Name 02/21/22 1400     Education   Cardiac Education Topics Pritikin   Select Core Videos     Core Videos   Educator Dietitian   Select Nutrition   Nutrition Other  Label Reading   Instruction Review Code 1- Verbalizes Understanding   Class Start Time 1400   Class Stop Time 1438   Class Time Calculation (min) 38 min    [place] Name 02/24/22 1400     Education   Cardiac Education Topics Pritikin   Environmental consultant Psychosocial   Psychosocial Workshop Healthy Sleep for a Healthy Heart   Instruction Review Code 1- Verbalizes Understanding   Class Start Time 1355   Class Stop Time 1447   Class Time Calculation (min) 52 min    [place] Name 02/26/22 1600     Education   Cardiac Education Topics [employer]   Educator Dietitian   Weekly Topic [place]   Instruction Review Code 1- Verbalizes Understanding   Class Start Time 1345   Class Stop Time 1432   Class Time Calculation (min) 47 min    [place] Name 02/28/22 1500     Education   Cardiac Education Topics Pritikin   Lexicographer Nutrition   Nutrition Calorie Density   Instruction Review Code 1- Verbalizes Understanding   Class Start Time  1355   Class Stop Time 1430   Class Time Calculation (min) 35 min    [place] Name 03/05/22 1500     Education   Cardiac Education Topics Pritikin   Dance movement psychotherapist   Weekly Topic Efficiency Cooking - Meals in a Snap   Instruction Review Code 1- Verbalizes Understanding   Class Start Time 5409   Class Stop Time 1452   Class Time Calculation (min) 57 min    [place] Name 03/12/22 1500     Education   Cardiac Education Topics Pritikin   Financial trader   Weekly Topic Fast and Healthy Breakfasts   Instruction Review Code 1- Verbalizes Understanding   Class Start Time 1400   Class Stop Time 1450   Class Time Calculation (min) 50 min            Core Videos: Exercise    Move It!  Clinical staff conducted group or individual video education with verbal and written material and guidebook.  Patient learns the recommended Pritikin exercise program. Exercise with the goal of living a long, healthy life. Some of the health benefits of exercise include controlled diabetes, healthier blood pressure levels, improved cholesterol levels, improved heart and lung capacity, improved sleep, and better body composition. Everyone should speak with their doctor before starting or changing an exercise routine.  Biomechanical Limitations Clinical staff conducted group or individual video education with verbal and written material and guidebook.  Patient learns how biomechanical limitations can impact exercise and how we can mitigate and possibly overcome limitations to have an impactful and balanced exercise routine.  Body Composition Clinical staff conducted group or individual video education with verbal and written material and guidebook.  Patient  learns that body composition (ratio of muscle mass to fat mass) is a key component to assessing overall fitness, rather than body weight alone. Increased fat mass,  especially visceral belly fat, can put [place] at increased risk for metabolic syndrome, type 2 diabetes, heart disease, and even death. It is recommended to combine diet and exercise (cardiovascular and resistance training) to improve your body composition. Seek guidance from your physician and exercise physiologist before implementing an exercise routine.  Exercise Action Plan Clinical staff conducted group or individual video education with verbal and written material and guidebook.  Patient learns the recommended strategies to achieve and enjoy long-term exercise adherence, including variety, self-motivation, self-efficacy, and positive decision making. Benefits of exercise include fitness, good health, weight management, more energy, better sleep, less stress, and overall well-being.  Medical   Heart Disease Risk Reduction Clinical staff conducted group or individual video education with verbal and written material and guidebook.  Patient learns our heart is our most vital organ as it circulates oxygen, nutrients, white blood cells, and hormones throughout the entire body, and carries waste away. Data supports a plant-based eating plan like the Pritikin Program for its effectiveness in slowing progression of and reversing heart disease. The video provides a number of recommendations to address heart disease.   Metabolic Syndrome and Belly Fat  Clinical staff conducted group or individual video education with verbal and written material and guidebook.  Patient learns what metabolic syndrome is, how it leads to heart disease, and how one can reverse it and keep it from coming back. You have metabolic syndrome if you have 3 of the following 5 criteria: abdominal obesity, high blood pressure, high triglycerides, low HDL cholesterol, and high blood sugar.  Hypertension and Heart Disease Clinical staff conducted group or individual video education with verbal and written material and guidebook.  Patient  learns that high blood pressure, or hypertension, is very common in the [place]. Hypertension is largely due to excessive salt intake, but other important risk factors include being overweight, physical inactivity, drinking too much alcohol, smoking, and not eating enough potassium from fruits and vegetables. High blood pressure is a leading risk factor for heart attack, stroke, congestive heart failure, dementia, kidney failure, and premature death. Long-term effects of excessive salt intake include stiffening of the arteries and thickening of heart muscle and organ damage. Recommendations include ways to reduce hypertension and the risk of heart disease.  Diseases of Our Time - Focusing on Diabetes Clinical staff conducted group or individual video education with verbal and written material and guidebook.  Patient learns why the best way to stop diseases of our time is prevention, through food and other lifestyle changes. Medicine (such as prescription pills and surgeries) is often only a Band-Aid on the problem, not a long-term solution. Most common diseases of our time include obesity, type 2 diabetes, hypertension, heart disease, and cancer. The Pritikin Program is recommended and has been proven to help reduce, reverse, and/or prevent the damaging effects of metabolic syndrome.  Nutrition   Overview of the Pritikin Eating Plan  Clinical staff conducted group or individual video education with verbal and written material and guidebook.  Patient learns about the [place] for disease risk reduction. The [place] emphasizes a wide variety of unrefined, minimally-processed carbohydrates, like fruits, vegetables, whole grains, and legumes. Go, Caution, and Stop food choices are explained. Plant-based and lean animal proteins are emphasized. Rationale provided for low sodium intake for blood pressure control, low  added sugars for blood sugar stabilization, and low added fats and  oils for coronary artery disease risk reduction and weight management.  Calorie Density  Clinical staff conducted group or individual video education with verbal and written material and guidebook.  Patient learns about calorie density and how it impacts the Pritikin Eating Plan. Knowing the characteristics of the food you choose will help you decide whether those foods will lead to weight gain or weight loss, and whether you want to consume more or less of them. Weight loss is usually a side effect of the Pritikin Eating Plan because of its focus on low calorie-dense foods.  Label Reading  Clinical staff conducted group or individual video education with verbal and written material and guidebook.  Patient learns about the Pritikin recommended label reading guidelines and corresponding recommendations regarding calorie density, added sugars, sodium content, and whole grains.  Dining Out - Part 1  Clinical staff conducted group or individual video education with verbal and written material and guidebook.  Patient learns that restaurant meals can be sabotaging because they can be so high in calories, fat, sodium, and/or sugar. Patient learns recommended strategies on how to positively address this and avoid unhealthy pitfalls.  Facts on Fats  Clinical staff conducted group or individual video education with verbal and written material and guidebook.  Patient learns that lifestyle modifications can be just as effective, if not more so, as many medications for lowering your risk of heart disease. A Pritikin lifestyle can help to reduce your risk of inflammation and atherosclerosis (cholesterol build-up, or plaque, in the artery walls). Lifestyle interventions such as dietary choices and physical activity address the cause of atherosclerosis. A review of the types of fats and their impact on blood cholesterol levels, along with dietary recommendations to reduce fat intake is also included.  Nutrition  Action Plan  Clinical staff conducted group or individual video education with verbal and written material and guidebook.  Patient learns how to incorporate Pritikin recommendations into their lifestyle. Recommendations include planning and keeping personal health goals in mind as an important part of their success.  Healthy Mind-Set    Healthy Minds, Bodies, Hearts  Clinical staff conducted group or individual video education with verbal and written material and guidebook.  Patient learns how to identify when they are stressed. Video will discuss the impact of that stress, as well as the many benefits of stress management. Patient will also be introduced to stress management techniques. The way we think, act, and feel has an impact on our hearts.  How Our Thoughts Can Heal Our Hearts  Clinical staff conducted group or individual video education with verbal and written material and guidebook.  Patient learns that negative thoughts can cause depression and anxiety. This can result in negative lifestyle behavior and serious health problems. Cognitive behavioral therapy is an effective method to help control our thoughts in order to change and improve our emotional outlook.  Additional Videos:  Exercise    Improving Performance  Clinical staff conducted group or individual video education with verbal and written material and guidebook.  Patient learns to use a non-linear approach by alternating intensity levels and lengths of time spent exercising to help burn more calories and lose more body fat. Cardiovascular exercise helps improve heart health, metabolism, hormonal balance, blood sugar control, and recovery from fatigue. Resistance training improves strength, endurance, balance, coordination, reaction time, metabolism, and muscle mass. Flexibility exercise improves circulation, posture, and balance. Seek guidance from your physician and  exercise physiologist before implementing an exercise routine  and learn your capabilities and proper form for all exercise.  Introduction to Yoga  Clinical staff conducted group or individual video education with verbal and written material and guidebook.  Patient learns about yoga, a discipline of the coming together of mind, breath, and body. The benefits of yoga include improved flexibility, improved range of motion, better posture and core strength, increased lung function, weight loss, and positive self-image. Yoga's heart health benefits include lowered blood pressure, healthier heart rate, decreased cholesterol and triglyceride levels, improved immune function, and reduced stress. Seek guidance from your physician and exercise physiologist before implementing an exercise routine and learn your capabilities and proper form for all exercise.  Medical   Aging: Enhancing Your Quality of Life  Clinical staff conducted group or individual video education with verbal and written material and guidebook.  Patient learns key strategies and recommendations to stay in good physical health and enhance quality of life, such as prevention strategies, having an advocate, securing a [place], and keeping a list of medications and system for tracking them. It also discusses how to avoid risk for bone loss.  Biology of Weight Control  Clinical staff conducted group or individual video education with verbal and written material and guidebook.  Patient learns that weight gain occurs because we consume more calories than we burn (eating more, moving less). Even if your body weight is normal, you may have higher ratios of fat compared to muscle mass. Too much body fat puts you at increased risk for cardiovascular disease, heart attack, stroke, type 2 diabetes, and obesity-related cancers. In addition to exercise, following the [place] can help reduce your risk.  Decoding Lab Results  Clinical staff conducted group or individual video  education with verbal and written material and guidebook.  Patient learns that lab test reflects one measurement whose values change over time and are influenced by many factors, including medication, stress, sleep, exercise, food, hydration, pre-existing medical conditions, and more. It is recommended to use the knowledge from this video to become more involved with your lab results and evaluate your numbers to speak with your doctor.   Diseases of Our Time - Overview  Clinical staff conducted group or individual video education with verbal and written material and guidebook.  Patient learns that according to the CDC, 50% to 70% of chronic diseases (such as obesity, type 2 diabetes, elevated lipids, hypertension, and heart disease) are avoidable through lifestyle improvements including healthier food choices, listening to satiety cues, and increased physical activity.  Sleep Disorders Clinical staff conducted group or individual video education with verbal and written material and guidebook.  Patient learns how good quality and duration of sleep are important to overall health and well-being. Patient also learns about sleep disorders and how they impact health along with recommendations to address them, including discussing with a physician.  Nutrition  Dining Out - Part 2 Clinical staff conducted group or individual video education with verbal and written material and guidebook.  Patient learns how to plan ahead and communicate in order to maximize their dining experience in a healthy and nutritious manner. Included are recommended food choices based on the type of restaurant the patient is visiting.   Fueling a Best boy conducted group or individual video education with verbal and written material and guidebook.  There is a strong connection between our food choices and our health. Diseases like obesity and  type 2 diabetes are very prevalent and are in large-part due to  lifestyle choices. The Pritikin Eating Plan provides plenty of food and hunger-curbing satisfaction. It is easy to follow, affordable, and helps reduce health risks.  Menu Workshop  Clinical staff conducted group or individual video education with verbal and written material and guidebook.  Patient learns that restaurant meals can sabotage health goals because they are often packed with calories, fat, sodium, and sugar. Recommendations include strategies to plan ahead and to communicate with the manager, chef, or server to help order a healthier meal.  Planning Your Eating Strategy  Clinical staff conducted group or individual video education with verbal and written material and guidebook.  Patient learns about the [place] and its benefit of reducing the risk of disease. The [place] does not focus on calories. Instead, it emphasizes high-quality, nutrient-rich foods. By knowing the characteristics of the foods, we choose, we can determine their calorie density and make informed decisions.  Targeting Your Nutrition Priorities  Clinical staff conducted group or individual video education with verbal and written material and guidebook.  Patient learns that lifestyle habits have a tremendous impact on disease risk and progression. This video provides eating and physical activity recommendations based on your personal health goals, such as reducing LDL cholesterol, losing weight, preventing or controlling type 2 diabetes, and reducing high blood pressure.  Vitamins and Minerals  Clinical staff conducted group or individual video education with verbal and written material and guidebook.  Patient learns different ways to obtain key vitamins and minerals, including through a recommended healthy diet. It is important to discuss all supplements you take with your doctor.   Healthy Mind-Set    Smoking Cessation  Clinical staff conducted group or individual video education with  verbal and written material and guidebook.  Patient learns that cigarette smoking and tobacco addiction pose a serious health risk which affects millions of people. Stopping smoking will significantly reduce the risk of heart disease, lung disease, and many forms of cancer. Recommended strategies for quitting are covered, including working with your doctor to develop a successful plan.  Culinary   Becoming a Financial trader conducted group or individual video education with verbal and written material and guidebook.  Patient learns that cooking at home can be healthy, cost-effective, quick, and puts them in control. Keys to cooking healthy recipes will include looking at your recipe, assessing your equipment needs, planning ahead, making it simple, choosing cost-effective seasonal ingredients, and limiting the use of added fats, salts, and sugars.  Cooking - Breakfast and Snacks  Clinical staff conducted group or individual video education with verbal and written material and guidebook.  Patient learns how important breakfast is to satiety and nutrition through the entire day. Recommendations include key foods to eat during breakfast to help stabilize blood sugar levels and to prevent overeating at meals later in the day. Planning ahead is also a key component.  Cooking - Human resources officer conducted group or individual video education with verbal and written material and guidebook.  Patient learns eating strategies to improve overall health, including an approach to cook more at home. Recommendations include thinking of animal protein as a side on your plate rather than center stage and focusing instead on lower calorie dense options like vegetables, fruits, whole grains, and plant-based proteins, such as beans. Making sauces in large quantities to freeze for later and leaving the skin on your vegetables are also  recommended to maximize your experience.  Cooking - Healthy  Salads and Dressing Clinical staff conducted group or individual video education with verbal and written material and guidebook.  Patient learns that vegetables, fruits, whole grains, and legumes are the foundations of the [place]. Recommendations include how to incorporate each of these in flavorful and healthy salads, and how to create homemade salad dressings. Proper handling of ingredients is also covered. Cooking - Soups and [employer] - Soups and Desserts Clinical staff conducted group or individual video education with verbal and written material and guidebook.  Patient learns that Pritikin soups and desserts make for easy, nutritious, and delicious snacks and meal components that are low in sodium, fat, sugar, and calorie density, while high in vitamins, minerals, and filling fiber. Recommendations include simple and healthy ideas for soups and desserts.   Overview     The Pritikin Solution Program Overview Clinical staff conducted group or individual video education with verbal and written material and guidebook.  Patient learns that the results of the [place] Program have been documented in more than 100 articles published in peer-reviewed journals, and the benefits include reducing risk factors for (and, in some cases, even reversing) high cholesterol, high blood pressure, type 2 diabetes, obesity, and more! An overview of the three key pillars of the Pritikin Program will be covered: eating well, doing regular exercise, and having a healthy mind-set.  WORKSHOPS  Exercise: Exercise Basics: Building Your Action Plan Clinical staff led group instruction and group discussion with PowerPoint presentation and patient guidebook. To enhance the learning environment the use of posters, models and videos may be added. At the conclusion of this workshop, patients will comprehend the difference between physical activity and exercise, as well as the benefits of  incorporating both, into their routine. Patients will understand the FITT (Frequency, Intensity, Time, and Type) principle and how to use it to build an exercise action plan. In addition, safety concerns and other considerations for exercise and cardiac rehab will be addressed by the presenter. The purpose of this lesson is to promote a comprehensive and effective weekly exercise routine in order to improve patients' overall level of fitness.   Managing Heart Disease: Your Path to a Healthier Heart Clinical staff led group instruction and group discussion with PowerPoint presentation and patient guidebook. To enhance the learning environment the use of posters, models and videos may be added.At the conclusion of this workshop, patients will understand the anatomy and physiology of the heart. Additionally, they will understand how Pritikin's three pillars impact the risk factors, the progression, and the management of heart disease.  The purpose of this lesson is to provide a high-level overview of the heart, heart disease, and how the Pritikin lifestyle positively impacts risk factors.  Exercise Biomechanics Clinical staff led group instruction and group discussion with PowerPoint presentation and patient guidebook. To enhance the learning environment the use of posters, models and videos may be added. Patients will learn how the structural parts of their bodies function and how these functions impact their daily activities, movement, and exercise. Patients will learn how to promote a neutral spine, learn how to manage pain, and identify ways to improve their physical movement in order to promote healthy living. The purpose of this lesson is to expose patients to common physical limitations that impact physical activity. Participants will learn practical ways to adapt and manage aches and pains, and to minimize their effect on regular exercise. Patients will learn how to maintain  good posture  while sitting, walking, and lifting.  Balance Training and Fall Prevention  Clinical staff led group instruction and group discussion with PowerPoint presentation and patient guidebook. To enhance the learning environment the use of posters, models and videos may be added. At the conclusion of this workshop, patients will understand the importance of their sensorimotor skills (vision, proprioception, and the vestibular system) in maintaining their ability to balance as they age. Patients will apply a variety of balancing exercises that are appropriate for their current level of function. Patients will understand the common causes for poor balance, possible solutions to these problems, and ways to modify their physical environment in order to minimize their fall risk. The purpose of this lesson is to teach patients about the importance of maintaining balance as they age and ways to minimize their risk of falling.  WORKSHOPS   Nutrition:  Fueling a Scientist, research (physical sciences) led group instruction and group discussion with PowerPoint presentation and patient guidebook. To enhance the learning environment the use of posters, models and videos may be added. Patients will review the foundational principles of the [place] and understand what constitutes a serving size in each of the food groups. Patients will also learn Pritikin-friendly foods that are better choices when away from home and review make-ahead meal and snack options. Calorie density will be reviewed and applied to three nutrition priorities: weight maintenance, weight loss, and weight gain. The purpose of this lesson is to reinforce (in a group setting) the key concepts around what patients are recommended to eat and how to apply these guidelines when away from home by planning and selecting Pritikin-friendly options. Patients will understand how calorie density may be adjusted for different weight management goals.  Mindful  Eating  Clinical staff led group instruction and group discussion with PowerPoint presentation and patient guidebook. To enhance the learning environment the use of posters, models and videos may be added. Patients will briefly review the concepts of the [place] and the importance of low-calorie dense foods. The concept of mindful eating will be introduced as well as the importance of paying attention to internal hunger signals. Triggers for non-hunger eating and techniques for dealing with triggers will be explored. The purpose of this lesson is to provide patients with the opportunity to review the basic principles of the [place], discuss the value of eating mindfully and how to measure internal cues of hunger and fullness using the Hunger Scale. Patients will also discuss reasons for non-hunger eating and learn strategies to use for controlling emotional eating.  Targeting Your Nutrition Priorities Clinical staff led group instruction and group discussion with PowerPoint presentation and patient guidebook. To enhance the learning environment the use of posters, models and videos may be added. Patients will learn how to determine their genetic susceptibility to disease by reviewing their family history. Patients will gain insight into the importance of diet as part of an overall healthy lifestyle in mitigating the impact of genetics and other environmental insults. The purpose of this lesson is to provide patients with the opportunity to assess their personal nutrition priorities by looking at their family history, their own health history and current risk factors. Patients will also be able to discuss ways of prioritizing and modifying the [place] for their highest risk areas  Menu  Clinical staff led group instruction and group discussion with PowerPoint presentation and patient guidebook. To enhance the learning environment the use of posters, models and  videos may  be added. Using menus brought in from [employer], or printed from [employer], patients will apply the [place] dining out guidelines that were presented in the [employer] video. Patients will also be able to practice these guidelines in a variety of provided scenarios. The purpose of this lesson is to provide patients with the opportunity to practice hands-on learning of the [place] with actual menus and practice scenarios.  Label Reading Clinical staff led group instruction and group discussion with PowerPoint presentation and patient guidebook. To enhance the learning environment the use of posters, models and videos may be added. Patients will review and discuss the Pritikin label reading guidelines presented in Pritikin's Label Reading Educational series video. Using fool labels brought in from local grocery stores and markets, patients will apply the label reading guidelines and determine if the packaged food meet the Pritikin guidelines. The purpose of this lesson is to provide patients with the opportunity to review, discuss, and practice hands-on learning of the Pritikin Label Reading guidelines with actual packaged food labels. [place] Workshops are designed to teach patients ways to prepare quick, simple, and affordable recipes at home. The importance of nutrition's role in chronic disease risk reduction is reflected in its emphasis in the overall Pritikin program. By learning how to prepare essential core Pritikin Eating Plan recipes, patients will increase control over what they eat; be able to customize the flavor of foods without the use of added salt, sugar, or fat; and improve the quality of the food they consume. By learning a set of core recipes which are easily assembled, quickly prepared, and affordable, patients are more likely to prepare more healthy foods at home. These workshops focus on convenient  breakfasts, simple entres, side dishes, and desserts which can be prepared with minimal effort and are consistent with nutrition recommendations for cardiovascular risk reduction. Cooking [employer] are taught by a Engineer, materials (RD) who has been trained by the [employer]. The chef or RD has a clear understanding of the importance of minimizing - if not completely eliminating - added fat, sugar, and sodium in recipes. Throughout the series of [place] Workshop sessions, patients will learn about healthy ingredients and efficient methods of cooking to build confidence in their capability to prepare    Cooking School weekly topics:  Adding Flavor- Sodium-Free  Fast and Healthy Breakfasts  Powerhouse Plant-Based Proteins  Satisfying Salads and Dressings  Simple Sides and Sauces  International Cuisine-Spotlight on the [place] Zones  Delicious Desserts  Savory Soups  Efficiency Cooking - Meals in a Snap  Tasty Appetizers and Snacks  Comforting Weekend Breakfasts  One-Pot Wonders   Fast Evening Meals  Easy [place] (Psychosocial): New Thoughts, New Behaviors Clinical staff led group instruction and group discussion with PowerPoint presentation and patient guidebook. To enhance the learning environment the use of posters, models and videos may be added. Patients will learn and practice techniques for developing effective health and lifestyle goals. Patients will be able to effectively apply the goal setting process learned to develop at least one new personal goal.  The purpose of this lesson is to expose patients to a new skill set of behavior modification techniques such as techniques setting SMART goals, overcoming barriers, and achieving new thoughts and new behaviors.  Managing Moods and Relationships Clinical staff led group instruction and group discussion  with PowerPoint  presentation and patient guidebook. To enhance the learning environment the use of posters, models and videos may be added. Patients will learn how emotional and chronic stress factors can impact their health and relationships. They will learn healthy ways to manage their moods and utilize positive coping mechanisms. In addition, ICR patients will learn ways to improve communication skills. The purpose of this lesson is to expose patients to ways of understanding how one's mood and health are intimately connected. Developing a healthy outlook can help build positive relationships and connections with others. Patients will understand the importance of utilizing effective communication skills that include actively listening and being heard. They will learn and understand the importance of the "4 Cs" and especially Connections in fostering of a Healthy Mind-Set.  Healthy Sleep for a Healthy Heart Clinical staff led group instruction and group discussion with PowerPoint presentation and patient guidebook. To enhance the learning environment the use of posters, models and videos may be added. At the conclusion of this workshop, patients will be able to demonstrate knowledge of the importance of sleep to overall health, well-being, and quality of life. They will understand the symptoms of, and treatments for, common sleep disorders. Patients will also be able to identify daytime and nighttime behaviors which impact sleep, and they will be able to apply these tools to help manage sleep-related challenges. The purpose of this lesson is to provide patients with a general overview of sleep and outline the importance of quality sleep. Patients will learn about a few of the most common sleep disorders. Patients will also be introduced to the concept of "sleep hygiene," and discover ways to self-manage certain sleeping problems through simple daily behavior changes. Finally, the workshop will motivate patients by clarifying  the links between quality sleep and their goals of heart-healthy living.   Recognizing and Reducing Stress Clinical staff led group instruction and group discussion with PowerPoint presentation and patient guidebook. To enhance the learning environment the use of posters, models and videos may be added. At the conclusion of this workshop, patients will be able to understand the types of stress reactions, differentiate between acute and chronic stress, and recognize the impact that chronic stress has on their health. They will also be able to apply different coping mechanisms, such as reframing negative self-talk. Patients will have the opportunity to practice a variety of stress management techniques, such as deep abdominal breathing, progressive muscle relaxation, and/or guided imagery.  The purpose of this lesson is to educate patients on the role of stress in their lives and to provide healthy techniques for coping with it.  Learning Barriers/Preferences:  Learning Barriers/Preferences - 01/16/22 1155       Learning Barriers/Preferences   Learning Barriers Sight   wears glasses   Learning Preferences Skilled Demonstration;Individual Instruction;Group Instruction             Education Topics:  Knowledge Questionnaire Score:  Knowledge Questionnaire Score - 01/16/22 1153       Knowledge Questionnaire Score   Pre Score 23/24             Core Components/Risk Factors/Patient Goals at Admission:  Personal Goals and Risk Factors at Admission - 01/16/22 1153       Core Components/Risk Factors/Patient Goals on Admission    Weight Management Yes;Obesity;Weight Loss    Intervention Weight Management: Develop a combined nutrition and exercise program designed to reach desired caloric intake, while maintaining appropriate intake of nutrient and fiber, sodium and fats, and  appropriate energy expenditure required for the weight goal.;Weight Management: Provide education and appropriate  resources to help participant work on and attain dietary goals.;Weight Management/Obesity: Establish reasonable short term and long term weight goals.;Obesity: Provide education and appropriate resources to help participant work on and attain dietary goals.    Admit Weight 235 lb 3.7 oz (106.7 kg)    Expected Outcomes Short Term: Continue to assess and modify interventions until short term weight is achieved;Long Term: Adherence to nutrition and physical activity/exercise program aimed toward attainment of established weight goal;Weight Maintenance: Understanding of the daily nutrition guidelines, which includes 25-35% calories from fat, 7% or less cal from saturated fats, less than 265m cholesterol, less than 1.5gm of sodium, & 5 or more servings of fruits and vegetables daily;Weight Loss: Understanding of general recommendations for a balanced deficit meal plan, which promotes 1-2 lb weight loss per week and includes a negative energy balance of [phone] kcal/d;Understanding recommendations for meals to include 15-35% energy as protein, 25-35% energy from fat, 35-60% energy from carbohydrates, less than 2053mof dietary cholesterol, 20-35 gm of total fiber daily;Understanding of distribution of calorie intake throughout the day with the consumption of 4-5 meals/snacks    Diabetes Yes    Intervention Provide education about signs/symptoms and action to take for hypo/hyperglycemia.;Provide education about proper nutrition, including hydration, and aerobic/resistive exercise prescription along with prescribed medications to achieve blood glucose in normal ranges: Fasting glucose 65-99 mg/dL    Expected Outcomes Short Term: Participant verbalizes understanding of the signs/symptoms and immediate care of hyper/hypoglycemia, proper foot care and importance of medication, aerobic/resistive exercise and nutrition plan for blood glucose control.;Long Term: Attainment of HbA1C < 7%.    Heart Failure Yes     Intervention Provide a combined exercise and nutrition program that is supplemented with education, support and counseling about heart failure. Directed toward relieving symptoms such as shortness of breath, decreased exercise tolerance, and extremity edema.    Expected Outcomes Improve functional capacity of life;Short term: Attendance in program 2-3 days a week with increased exercise capacity. Reported lower sodium intake. Reported increased fruit and vegetable intake. Reports medication compliance.;Short term: Daily weights obtained and reported for increase. Utilizing diuretic protocols set by physician.;Long term: Adoption of self-care skills and reduction of barriers for early signs and symptoms recognition and intervention leading to self-care maintenance.    Hypertension Yes    Intervention Provide education on lifestyle modifcations including regular physical activity/exercise, weight management, moderate sodium restriction and increased consumption of fresh fruit, vegetables, and low fat dairy, alcohol moderation, and smoking cessation.;Monitor prescription use compliance.    Expected Outcomes Short Term: Continued assessment and intervention until BP is < 140/9040mG in hypertensive participants. < 130/83m70m in hypertensive participants with diabetes, heart failure or chronic kidney disease.;Long Term: Maintenance of blood pressure at goal levels.    Lipids Yes    Intervention Provide education and support for participant on nutrition & aerobic/resistive exercise along with prescribed medications to achieve LDL <70mg3mL >40mg.38mExpected Outcomes Short Term: Participant states understanding of desired cholesterol values and is compliant with medications prescribed. Participant is following exercise prescription and nutrition guidelines.;Long Term: Cholesterol controlled with medications as prescribed, with individualized exercise RX and with personalized nutrition plan. Value goals: LDL <  70mg, 6m> 40 mg.    Stress Yes    Intervention Offer individual and/or small group education and counseling on adjustment to heart disease, stress management and health-related lifestyle change. Teach and support  self-help strategies.;Refer participants experiencing significant psychosocial distress to appropriate mental health specialists for further evaluation and treatment. When possible, include family members and significant others in education/counseling sessions.    Expected Outcomes Short Term: Participant demonstrates changes in health-related behavior, relaxation and other stress management skills, ability to obtain effective social support, and compliance with psychotropic medications if prescribed.;Long Term: Emotional wellbeing is indicated by absence of clinically significant psychosocial distress or social isolation.             Core Components/Risk Factors/Patient Goals Review:   Goals and Risk Factor Review     Row Name 01/27/22 0840 02/18/22 1804 03/18/22 1614         Core Components/Risk Factors/Patient Goals Review   Personal Goals Review Weight Management/Obesity;Lipids;Diabetes;Hypertension;Stress;Heart Failure Weight Management/Obesity;Lipids;Diabetes;Hypertension;Stress;Heart Failure Weight Management/Obesity;Lipids;Diabetes;Hypertension;Stress;Heart Failure     Review Lamont started intensive cardiac rehab on 01/24/22 and did well with exercise. Vital signs and CBG's were stable. Lamont did look fatigued as he had dialysis earlier that day [name] is doing well with exercise.  Vital signs and CBG's were stable. [name] continues to look fatigued as her has dialysis attends cardiac rehab and goes to work. [name] has lost 2.2 kg since starting cardiac rehab [name] is doing well with exercise.  Vital signs and CBG's were stable. [name] has enjoyed participating in the program. Lamont's weights have been variable due to HD. [name] will complete intensive cardiac rehab on  03/19/22     Expected Outcomes Lamont will continue to participate in intensive cardiac rehab for exercise, nutrition and lifestyle modifications [name] will continue to participate in intensive cardiac rehab for exercise, nutrition and lifestyle modifications [name] will continue to participate in intensive cardiac rehab for exercise, nutrition and lifestyle modifications              Core Components/Risk Factors/Patient Goals at Discharge (Final Review):   Goals and Risk Factor Review - 03/18/22 1614       Core Components/Risk Factors/Patient Goals Review   Personal Goals Review Weight Management/Obesity;Lipids;Diabetes;Hypertension;Stress;Heart Failure    Review [name] is doing well with exercise.  Vital signs and CBG's were stable. [name] has enjoyed participating in the program. Lamont's weights have been variable due to HD. [name] will complete intensive cardiac rehab on 03/19/22    Expected Outcomes Lamont will continue to participate in intensive cardiac rehab for exercise, nutrition and lifestyle modifications             ITP Comments:  ITP Comments     Row Name 01/16/22 1055 01/22/22 1232 02/18/22 1800 03/18/22 1608     ITP Comments Dr [name] MD, Medical Director. Introduction to Pritikin Education Program/ Intensive Cardiac Rehab. Initial Orientation Packet reviewed with the patient 30 day ITP Review. [name] is scheduled to begin exercise at intensive cardiac rehab on Friday 01/24/22. 30 day ITP Review. [name] has good attendance and participation in intensive cardiac rehab 30 day ITP Review. [name] has good attendance and participation in intensive cardiac rehab.             Comments: See ITP comments.Harrell Gave RN BSN

## 2022-03-19 ENCOUNTER — Encounter (HOSPITAL_COMMUNITY)
Admission: RE | Admit: 2022-03-19 | Discharge: 2022-03-19 | Disposition: A | Discharge status: Home / Self Care | Payer: Medicare Other | Source: Ambulatory Visit | Attending: Cardiology | Admitting: Cardiology

## 2022-03-19 DIAGNOSIS — I2089 Other forms of angina pectoris: Secondary | ICD-10-CM

## 2022-03-19 DIAGNOSIS — Z955 Presence of coronary angioplasty implant and graft: Secondary | ICD-10-CM

## 2022-03-19 NOTE — Progress Notes (Signed)
Discharge Progress Report  Patient Details  Name: [name] MRN: [id number] Date of Birth: 09/22/1975 Referring Provider:   Flowsheet Row INTENSIVE CARDIAC REHAB ORIENT from 01/16/2022 in [facility]  Referring Provider [name], MD [name], MD - covering)        Number of Visits: 19 exercise sessions and 15 education classes  Reason for Discharge:  Patient reached a stable level of exercise. Patient independent in their exercise. Patient has met program and personal goals.  Smoking History:  Social History   Tobacco Use  Smoking Status Former   Types: Cigarettes   Quit date: 09/17/2008   Years since quitting: 13.5  Smokeless Tobacco Never    Diagnosis:  10/08/21 S/P PCI/DES SVG OM1  Stable angina  ADL UCSD:   Initial Exercise Prescription:  Initial Exercise Prescription - 01/16/22 1200       Date of Initial Exercise RX and Referring Provider   Date 01/16/22    Referring Provider [name], MD [name], MD - covering)    Expected Discharge Date 03/14/22      Recumbant Bike   Level 2    Watts 50    Minutes 15    METs 3.5      NuStep   Level 2    SPM 80    Minutes 15    METs 3.5      Prescription Details   Frequency (times per week) 3    Duration Progress to 30 minutes of continuous aerobic without signs/symptoms of physical distress      Intensity   THRR 40-80% of Max Heartrate 69-138    Ratings of Perceived Exertion 11-13    Perceived Dyspnea 0-4      Progression   Progression Continue to progress workloads to maintain intensity without signs/symptoms of physical distress.      Resistance Training   Training Prescription Yes    Weight 5 lbs    Reps 10-15             Discharge Exercise Prescription (Final Exercise Prescription Changes):  Exercise Prescription Changes - 03/19/22 1349       Response to Exercise   Blood Pressure (Admit) 152/73     Blood Pressure (Exercise) 155/74    Blood Pressure (Exit) 152/80    Heart Rate (Admit) 80 bpm    Heart Rate (Exercise) 105 bpm    Heart Rate (Exit) 83 bpm    Rating of Perceived Exertion (Exercise) 11.5    Symptoms None    Comments Pt graduated from the CRP2 program today    Duration Continue with 30 min of aerobic exercise without signs/symptoms of physical distress.    Intensity THRR unchanged      Progression   Progression Continue to progress workloads to maintain intensity without signs/symptoms of physical distress.    Average METs 2.4      Resistance Training   Training Prescription No    Weight No weights on wednesdays      Interval Training   Interval Training No      Biostep-RELP   Level 1    Minutes 15    METs 2.4      Track   Laps 5    Minutes 5    METs 1.35   Pt had to complete form     Home Exercise Plan   Plans to continue exercise at [employer] (comment)    Frequency Add  2 additional days to program exercise sessions.    Initial Home Exercises Provided 02/19/22             Functional Capacity:  6 Minute Walk     Row Name 01/16/22 1056 03/10/22 1255       6 Minute Walk   Phase Initial Discharge    Distance 1328 feet 1294 feet    Distance % Change -- -2.56 %    Distance Feet Change -- -34 ft    Walk Time 6 minutes 6 minutes    # of Rest Breaks 0 0    MPH 12.28 2.52    METS 3.51 3.79    RPE 11 10    Perceived Dyspnea  0 0    VO2 Peak 12.28 13.25    Symptoms Yes (comment) No    Comments Left calf pain 2/10 --    Resting HR 83 bpm 86 bpm    Resting BP 122/72 128/80    Resting Oxygen Saturation  98 % --    Exercise Oxygen Saturation  during 6 min walk 97 % --    Max Ex. HR 91 bpm 105 bpm    Max Ex. BP 126/81 158/82    2 Minute Post BP 123/83 --             Psychological, QOL, Others - Outcomes: PHQ 2/9:    03/19/2022    1:05 PM 01/16/2022   12:25 PM 12/24/2020    2:41 PM 11/01/2020    3:21 PM 08/29/2016   11:42 AM   Depression screen PHQ 2/9  Decreased Interest 0 0 0 0 0  Down, Depressed, Hopeless 0 0 0 0 0  PHQ - 2 Score 0 0 0 0 0    Quality of Life:  Quality of Life - 01/16/22 1152       Quality of Life   Select Quality of Life      Quality of Life Scores   Health/Function Pre 21.96 %    Socioeconomic Pre 17.21 %    Psych/Spiritual Pre 21.43 %    Family Pre 21 %    GLOBAL Pre 20.69 %             Personal Goals: Goals established at orientation with interventions provided to work toward goal.  Personal Goals and Risk Factors at Admission - 01/16/22 1153       Core Components/Risk Factors/Patient Goals on Admission    Weight Management Yes;Obesity;Weight Loss    Intervention Weight Management: Develop a combined nutrition and exercise program designed to reach desired caloric intake, while maintaining appropriate intake of nutrient and fiber, sodium and fats, and appropriate energy expenditure required for the weight goal.;Weight Management: Provide education and appropriate resources to help participant work on and attain dietary goals.;Weight Management/Obesity: Establish reasonable short term and long term weight goals.;Obesity: Provide education and appropriate resources to help participant work on and attain dietary goals.    Admit Weight 235 lb 3.7 oz (106.7 kg)    Expected Outcomes Short Term: Continue to assess and modify interventions until short term weight is achieved;Long Term: Adherence to nutrition and physical activity/exercise program aimed toward attainment of established weight goal;Weight Maintenance: Understanding of the daily nutrition guidelines, which includes 25-35% calories from fat, 7% or less cal from saturated fats, less than 234m cholesterol, less than 1.5gm of sodium, & 5 or more servings of fruits and vegetables daily;Weight Loss: Understanding of general recommendations for a balanced deficit meal plan, which  promotes 1-2 lb weight loss per week and includes  a negative energy balance of [phone] kcal/d;Understanding recommendations for meals to include 15-35% energy as protein, 25-35% energy from fat, 35-60% energy from carbohydrates, less than 247m of dietary cholesterol, 20-35 gm of total fiber daily;Understanding of distribution of calorie intake throughout the day with the consumption of 4-5 meals/snacks    Diabetes Yes    Intervention Provide education about signs/symptoms and action to take for hypo/hyperglycemia.;Provide education about proper nutrition, including hydration, and aerobic/resistive exercise prescription along with prescribed medications to achieve blood glucose in normal ranges: Fasting glucose 65-99 mg/dL    Expected Outcomes Short Term: Participant verbalizes understanding of the signs/symptoms and immediate care of hyper/hypoglycemia, proper foot care and importance of medication, aerobic/resistive exercise and nutrition plan for blood glucose control.;Long Term: Attainment of HbA1C < 7%.    Heart Failure Yes    Intervention Provide a combined exercise and nutrition program that is supplemented with education, support and counseling about heart failure. Directed toward relieving symptoms such as shortness of breath, decreased exercise tolerance, and extremity edema.    Expected Outcomes Improve functional capacity of life;Short term: Attendance in program 2-3 days a week with increased exercise capacity. Reported lower sodium intake. Reported increased fruit and vegetable intake. Reports medication compliance.;Short term: Daily weights obtained and reported for increase. Utilizing diuretic protocols set by physician.;Long term: Adoption of self-care skills and reduction of barriers for early signs and symptoms recognition and intervention leading to self-care maintenance.    Hypertension Yes    Intervention Provide education on lifestyle modifcations including regular physical activity/exercise, weight management, moderate sodium  restriction and increased consumption of fresh fruit, vegetables, and low fat dairy, alcohol moderation, and smoking cessation.;Monitor prescription use compliance.    Expected Outcomes Short Term: Continued assessment and intervention until BP is < 140/990mHG in hypertensive participants. < 130/8077mG in hypertensive participants with diabetes, heart failure or chronic kidney disease.;Long Term: Maintenance of blood pressure at goal levels.    Lipids Yes    Intervention Provide education and support for participant on nutrition & aerobic/resistive exercise along with prescribed medications to achieve LDL <6m8mDL >40mg20m Expected Outcomes Short Term: Participant states understanding of desired cholesterol values and is compliant with medications prescribed. Participant is following exercise prescription and nutrition guidelines.;Long Term: Cholesterol controlled with medications as prescribed, with individualized exercise RX and with personalized nutrition plan. Value goals: LDL < 6mg,81m > 40 mg.    Stress Yes    Intervention Offer individual and/or small group education and counseling on adjustment to heart disease, stress management and health-related lifestyle change. Teach and support self-help strategies.;Refer participants experiencing significant psychosocial distress to appropriate mental health specialists for further evaluation and treatment. When possible, include family members and significant others in education/counseling sessions.    Expected Outcomes Short Term: Participant demonstrates changes in health-related behavior, relaxation and other stress management skills, ability to obtain effective social support, and compliance with psychotropic medications if prescribed.;Long Term: Emotional wellbeing is indicated by absence of clinically significant psychosocial distress or social isolation.              Personal Goals Discharge:  Goals and Risk Factor Review     Row Name  01/27/22 0840 02/18/22 1804 03/18/22 1614         Core Components/Risk Factors/Patient Goals Review   Personal Goals Review Weight Management/Obesity;Lipids;Diabetes;Hypertension;Stress;Heart Failure Weight Management/Obesity;Lipids;Diabetes;Hypertension;Stress;Heart Failure Weight Management/Obesity;Lipids;Diabetes;Hypertension;Stress;Heart Failure     Review Lamont started  intensive cardiac rehab on 01/24/22 and did well with exercise. Vital signs and CBG's were stable. Lamont did look fatigued as he had dialysis earlier that day [name] is doing well with exercise.  Vital signs and CBG's were stable. [name] continues to look fatigued as her has dialysis attends cardiac rehab and goes to work. [name] has lost 2.2 kg since starting cardiac rehab [name] is doing well with exercise.  Vital signs and CBG's were stable. [name] has enjoyed participating in the program. Lamont's weights have been variable due to HD. [name] will complete intensive cardiac rehab on 03/19/22     Expected Outcomes Lamont will continue to participate in intensive cardiac rehab for exercise, nutrition and lifestyle modifications [name] will continue to participate in intensive cardiac rehab for exercise, nutrition and lifestyle modifications [name] will continue to participate in intensive cardiac rehab for exercise, nutrition and lifestyle modifications              Exercise Goals and Review:  Exercise Goals     Row Name 01/16/22 1158             Exercise Goals   Increase Physical Activity Yes       Intervention Provide advice, education, support and counseling about physical activity/exercise needs.;Develop an individualized exercise prescription for aerobic and resistive training based on initial evaluation findings, risk stratification, comorbidities and participant's personal goals.       Expected Outcomes Short Term: Attend rehab on a regular basis to increase amount of physical activity.;Long Term: Add in home  exercise to make exercise part of routine and to increase amount of physical activity.;Long Term: Exercising regularly at least 3-5 days a week.       Increase Strength and Stamina Yes       Intervention Provide advice, education, support and counseling about physical activity/exercise needs.;Develop an individualized exercise prescription for aerobic and resistive training based on initial evaluation findings, risk stratification, comorbidities and participant's personal goals.       Expected Outcomes Short Term: Increase workloads from initial exercise prescription for resistance, speed, and METs.;Short Term: Perform resistance training exercises routinely during rehab and add in resistance training at home;Long Term: Improve cardiorespiratory fitness, muscular endurance and strength as measured by increased METs and functional capacity (6MWT)       Able to understand and use rate of perceived exertion (RPE) scale Yes       Intervention Provide education and explanation on how to use RPE scale       Expected Outcomes Short Term: Able to use RPE daily in rehab to express subjective intensity level;Long Term:  Able to use RPE to guide intensity level when exercising independently       Knowledge and understanding of Target Heart Rate Range (THRR) Yes       Intervention Provide education and explanation of THRR including how the numbers were predicted and where they are located for reference       Expected Outcomes Short Term: Able to state/look up THRR;Short Term: Able to use daily as guideline for intensity in rehab;Long Term: Able to use THRR to govern intensity when exercising independently       Understanding of Exercise Prescription Yes       Intervention Provide education, explanation, and written materials on patient's individual exercise prescription       Expected Outcomes Short Term: Able to explain program exercise prescription;Long Term: Able to explain home exercise prescription to exercise  independently  Exercise Goals Re-Evaluation:  Exercise Goals Re-Evaluation     [place] Name 01/24/22 1414 02/17/22 1608 03/19/22 1357 03/20/22 1353       Exercise Goal Re-Evaluation   Exercise Goals Review Increase Physical Activity;Increase Strength and Stamina;Able to understand and use rate of perceived exertion (RPE) scale;Knowledge and understanding of Target Heart Rate Range (THRR);Understanding of Exercise Prescription Increase Physical Activity;Increase Strength and Stamina;Able to understand and use rate of perceived exertion (RPE) scale;Knowledge and understanding of Target Heart Rate Range (THRR);Understanding of Exercise Prescription Increase Physical Activity;Increase Strength and Stamina;Able to understand and use rate of perceived exertion (RPE) scale;Knowledge and understanding of Target Heart Rate Range (THRR);Understanding of Exercise Prescription --    Comments Pt's first day in the CRP2 program. Pt understands his exercise Rx, THRR and RPE scale. Reviewed METs and goals. Pt voices he still gets SOB going up stairs. Pt takes breaks during the nustep to rest. Pt graduated form the CRP2 program. Pt had peak METs of 3.3. Pt did not consistantly achieve this level. It depended upon how he was feeling s/p dialysis. 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 --    Expected Outcomes Will continue to monitor and progress exercise workloads as tolerated. Will continue to monitor and progress exercise workloads as tolerated. Pt will continue his exercise at [employer]. --             Nutrition & Weight - Outcomes:  Pre Biometrics - 01/16/22 1000       Pre Biometrics   Waist Circumference 46.5 inches    Hip Circumference 44.25 inches    Waist to Hip Ratio 1.05 %    Triceps Skinfold 18 mm    % Body Fat 34.6 %    Grip Strength 30 kg    Flexibility 0 in   Unable to reach   Single Leg Stand 7.43 seconds              Post Biometrics - 03/17/22 1602        Post  Biometrics   Height 5' 6.5" (1.689 m)    Weight 109.1 kg    Waist Circumference 47.25 inches    Hip Circumference 44 inches    Waist to Hip Ratio 1.07 %    BMI (Calculated) 38.24    Triceps Skinfold 19 mm    % Body Fat 35.5 %    Grip Strength 30 kg    Flexibility 9.75 in    Single Leg Stand 7.87 seconds             Nutrition:  Nutrition Therapy & Goals - 02/24/22 1402       Nutrition Therapy   Diet Heart Healthy/Carbohydrate Consistent/Renal diet    Drug/Food Interactions Statins/Certain Fruits      Personal Nutrition Goals   Nutrition Goal Patient to identify strategies for managing cardiovascular risk by attending the weekly [place] education and nutition series    Personal Goal #2 Patient to reduce sodium to <1521m per day.    Personal Goal #3 Patient to identify food sources and limit daily intake of saturated fat, trans fat, sodium, and refined carbohydrates.    Comments LVincente Libertyreports contemplation of nutrition goals. LVincente Libertycontinues to attend the [facility] education and nutrition series. LVincente Libertycontinues dialysis Monday/Wednesday/Friday. His A1c remains well controlled (<7%) at 6.6%. He continues to eat out 2-3x per day due to busy work and dialysis schedule and overall fatigue from dialysis.  He continues to work toward compliance of the [place] eating plan to aid with reading food labels for sodium and sugar and reducing fried foods, red meat, and sugary drinks. He has maintained his weight since starting with our program. Although he continues eating out regularly, he is trying to make healthier choices to include grilled meats, fruits/vegetables, and water. He continues regular follow-up with sleep medicine for BiPAP.      Intervention Plan   Intervention Prescribe, educate and counsel regarding individualized specific dietary modifications aiming towards targeted core components such as weight, hypertension,  lipid management, diabetes, heart failure and other comorbidities.;Nutrition handout(s) given to patient.    Expected Outcomes Short Term Goal: Understand basic principles of dietary content, such as calories, fat, sodium, cholesterol and nutrients.;Short Term Goal: A plan has been developed with personal nutrition goals set during dietitian appointment.;Long Term Goal: Adherence to prescribed nutrition plan.             Nutrition Discharge:   Education Questionnaire Score:  Knowledge Questionnaire Score - 01/16/22 1153       Knowledge Questionnaire Score   Pre Score 23/24             Goals reviewed with patient; copy given to patient. Pt graduated from  Intensive/Traditional cardiac rehab program today with completion of   34 exercise and education sessions. Pt maintained good attendance and progressed nicely during their participation in rehab as evidenced by increased MET level.   Medication list reconciled. Repeat  PHQ score-0  .  Pt has made significant lifestyle changes and should be commended for their success. Lamont achieved their goals during cardiac rehab.   Pt plans to continue exercise by working with a personal trainer twice a week. Proud of Lamont as he came to intensive cardiac rehab after going to dialysis and then going to work second shift. [place]'s weight remained unchanged.Harrell Gave RN BSN

## 2022-11-23 ENCOUNTER — Emergency Department (HOSPITAL_COMMUNITY)
Admission: EM | Admit: 2022-11-23 | Discharge: 2022-12-14 | Disposition: E | Payer: Medicare Other | Attending: Student | Admitting: Student

## 2022-11-23 ENCOUNTER — Encounter (HOSPITAL_COMMUNITY): Payer: Self-pay

## 2022-11-23 DIAGNOSIS — D631 Anemia in chronic kidney disease: Secondary | ICD-10-CM | POA: Insufficient documentation

## 2022-11-23 DIAGNOSIS — Z955 Presence of coronary angioplasty implant and graft: Secondary | ICD-10-CM | POA: Insufficient documentation

## 2022-11-23 DIAGNOSIS — Z7984 Long term (current) use of oral hypoglycemic drugs: Secondary | ICD-10-CM | POA: Insufficient documentation

## 2022-11-23 DIAGNOSIS — Z79899 Other long term (current) drug therapy: Secondary | ICD-10-CM | POA: Insufficient documentation

## 2022-11-23 DIAGNOSIS — I132 Hypertensive heart and chronic kidney disease with heart failure and with stage 5 chronic kidney disease, or end stage renal disease: Secondary | ICD-10-CM | POA: Insufficient documentation

## 2022-11-23 DIAGNOSIS — I5023 Acute on chronic systolic (congestive) heart failure: Secondary | ICD-10-CM | POA: Diagnosis not present

## 2022-11-23 DIAGNOSIS — Z87891 Personal history of nicotine dependence: Secondary | ICD-10-CM | POA: Diagnosis not present

## 2022-11-23 DIAGNOSIS — Z992 Dependence on renal dialysis: Secondary | ICD-10-CM | POA: Insufficient documentation

## 2022-11-23 DIAGNOSIS — E1122 Type 2 diabetes mellitus with diabetic chronic kidney disease: Secondary | ICD-10-CM | POA: Insufficient documentation

## 2022-11-23 DIAGNOSIS — Z794 Long term (current) use of insulin: Secondary | ICD-10-CM | POA: Diagnosis not present

## 2022-11-23 DIAGNOSIS — I2510 Atherosclerotic heart disease of native coronary artery without angina pectoris: Secondary | ICD-10-CM | POA: Diagnosis not present

## 2022-11-23 DIAGNOSIS — I469 Cardiac arrest, cause unspecified: Secondary | ICD-10-CM | POA: Diagnosis present

## 2022-11-23 DIAGNOSIS — N186 End stage renal disease: Secondary | ICD-10-CM | POA: Diagnosis not present

## 2022-11-23 DIAGNOSIS — Z7982 Long term (current) use of aspirin: Secondary | ICD-10-CM | POA: Insufficient documentation

## 2022-11-23 LAB — I-STAT CHEM 8, ED
BUN: 57 mg/dL — ABNORMAL HIGH (ref 6–20)
Calcium, Ion: 1.22 mmol/L (ref 1.15–1.40)
Chloride: 99 mmol/L (ref 98–111)
Creatinine, Ser: 11.9 mg/dL — ABNORMAL HIGH (ref 0.61–1.24)
Glucose, Bld: 254 mg/dL — ABNORMAL HIGH (ref 70–99)
HCT: 39 % (ref 39.0–52.0)
Hemoglobin: 13.3 g/dL (ref 13.0–17.0)
Potassium: 4 mmol/L (ref 3.5–5.1)
Sodium: 139 mmol/L (ref 135–145)
TCO2: 27 mmol/L (ref 22–32)

## 2022-11-23 LAB — I-STAT VENOUS BLOOD GAS, ED
Acid-base deficit: 3 mmol/L — ABNORMAL HIGH (ref 0.0–2.0)
Bicarbonate: 27.5 mmol/L (ref 20.0–28.0)
Calcium, Ion: 1.23 mmol/L (ref 1.15–1.40)
HCT: 38 % — ABNORMAL LOW (ref 39.0–52.0)
Hemoglobin: 12.9 g/dL — ABNORMAL LOW (ref 13.0–17.0)
O2 Saturation: 48 %
Potassium: 4 mmol/L (ref 3.5–5.1)
Sodium: 139 mmol/L (ref 135–145)
TCO2: 30 mmol/L (ref 22–32)
pCO2, Ven: 77 mmHg (ref 44–60)
pH, Ven: 7.161 — CL (ref 7.25–7.43)
pO2, Ven: 34 mmHg (ref 32–45)

## 2022-11-23 LAB — I-STAT CG4 LACTIC ACID, ED: Lactic Acid, Venous: 7.6 mmol/L (ref 0.5–1.9)

## 2022-11-23 MED ORDER — EPINEPHRINE 1 MG/10ML IJ SOSY
PREFILLED_SYRINGE | INTRAMUSCULAR | Status: DC | PRN
  Administered 2022-11-23: 1 mg via INTRAVENOUS

## 2022-11-23 MED ORDER — CALCIUM CHLORIDE 10 % IV SOLN
INTRAVENOUS | Status: DC | PRN
  Administered 2022-11-23: 1 g via INTRAVENOUS

## 2022-11-23 MED ORDER — LIDOCAINE HCL (CARDIAC) PF 100 MG/5ML IV SOSY
PREFILLED_SYRINGE | INTRAVENOUS | Status: DC | PRN
  Administered 2022-11-23: 100 mg via INTRAVENOUS

## 2022-12-14 NOTE — Code Documentation (Signed)
Patient time of death occurred at 1322. 

## 2022-12-14 NOTE — Code Documentation (Signed)
Shock delivered 1317

## 2022-12-14 NOTE — Code Documentation (Signed)
Pulse check at 1306 pulseless

## 2022-12-14 NOTE — Code Documentation (Signed)
Pt was at church this [place] and was not feeling well pt went to the back of the church and then was found unresponsive there. When fire arrived pt had irregular breathing. Ems arrived found pt to be in vfib with capnography of 40. Ems gave 15 rounds of epinephrine and delivered 15 shocks.

## 2022-12-14 NOTE — Code Documentation (Signed)
Asystole on monitor. 1319

## 2022-12-14 NOTE — Code Documentation (Signed)
Pulse check: no pulse 

## 2022-12-14 NOTE — Code Documentation (Signed)
Pulse check pt is pulseless v fib on monitor 1312

## 2022-12-14 NOTE — Code Documentation (Signed)
18 rac

## 2022-12-14 NOTE — Code Documentation (Signed)
Compressions stop

## 2022-12-14 NOTE — Code Documentation (Signed)
No pulse vfib

## 2022-12-14 NOTE — Code Documentation (Signed)
Pt shocked 

## 2022-12-14 NOTE — Code Documentation (Signed)
Continued compressions  

## 2022-12-14 NOTE — Code Documentation (Signed)
100 lidocaine

## 2022-12-14 NOTE — Code Documentation (Signed)
Pulse check no pulses. Pt in vfib pt shocked.

## 2022-12-14 NOTE — Code Documentation (Signed)
Shock delivered 1320

## 2022-12-14 NOTE — ED Provider Notes (Signed)
[name] EMERGENCY DEPARTMENT AT [facility] Provider Note  CSN: [id number] Arrival date & time: 12/05/2022 1306  Chief Complaint(s) Cardiac Arrest  HPI [name] is a 47 y.o. male with PMH CAD, CHF, ESRD on hemodialysis who presents to the emergency department in cardiac arrest.  Patient was in church this morning when he suffered cardiac arrest and was found down in a back room by his wife.  EMS was on scene for at least 30 minutes and performed at least 12 shocks with 12 rounds of epinephrine prior to arrival for a V-fib arrest.  Patient arrives with active compressions being performed.  Patient intubated by EMS prior to arrival.  Additional history unable to be obtained as patient is unresponsive   Past Medical History Past Medical History:  Diagnosis Date   CAD (coronary artery disease)    CHF (congestive heart failure) (HCC)    Chronic kidney disease    Diabetes mellitus    Heart murmur    " slight"   Hyperlipidemia    Hypertension    Sleep apnea    wears CPAP   Patient Active Problem List   Diagnosis Date Noted   ESRD on dialysis (HCC) 08/25/2017   Hypertensive emergency 07/06/2017   Acute on chronic systolic CHF (congestive heart failure) (HCC) 07/06/2017   Anemia associated with chronic renal failure 07/06/2017   Acute renal failure superimposed on stage 5 chronic kidney disease, not on chronic dialysis (HCC) 07/06/2017   Acute respiratory failure with hypoxia (HCC) 07/06/2017   Suspected sleep apnea 08/18/2016   Seizure (HCC) 03/25/2015   Observed seizure-like activity (HCC) 03/25/2015   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  Authorizing Provider  acetaminophen (TYLENOL) 500 MG tablet Take 500-1,000 mg by mouth every 6 (six) hours as needed for moderate pain or mild pain.    [provider]  anastrozole (ARIMIDEX) 1 MG tablet Take 1 mg by mouth every other day. 09/06/20   [provider]  aspirin 81 MG EC tablet Take 81 mg by mouth daily.    [provider]  atorvastatin (LIPITOR) 40 MG tablet Take 40 mg by mouth at bedtime. 08/02/20   [provider]  blood glucose meter kit and supplies KIT Dispense based on patient and insurance preference. Use QD-BID home glucose monitoring. (FOR ICD-9 250.00, 250.01). 08/18/16   Kenneth Oar, PA  carvedilol (COREG) 25 MG tablet Take 25 mg by mouth 2 (two) times daily with a meal.    [provider]  Cholecalciferol (VITAMIN D3) 125 MCG (5000 UT) TABS Take 5,000 Units by mouth daily.    [provider]  cinacalcet (SENSIPAR) 90 MG tablet Take 90 mg by mouth every Monday, Wednesday, and Friday. 12/11/21 12/10/22  [provider]  ezetimibe (ZETIA) 10 MG tablet Take 10 mg by mouth at bedtime. 08/30/20   [provider]  gabapentin (NEURONTIN) 100 MG capsule Take 100 mg by mouth 3 (three) times daily as needed (Nerve pain). 09/22/20   [provider]  glimepiride (AMARYL) 1 MG tablet Take 1 mg by mouth daily. 07/24/20   [provider]  icosapent Ethyl (VASCEPA) 1 g capsule Take 2 g by  mouth 2 (two) times daily. 09/27/20   [provider]  lanthanum (FOSRENOL) 1000 MG chewable tablet Chew 2,000 mg by mouth 3 (three) times daily with meals.    [provider]  lidocaine-prilocaine (EMLA) cream Apply 1 Application topically as needed (on port). 07/19/21   [provider]  nitroGLYCERIN (NITROSTAT) 0.4 MG SL tablet DISSOLVE ONE TABLET UNDER THE TONGUE EVERY 5 MINUTES AS NEEDED FOR CHEST PAIN.  DO NOT EXCEED A TOTAL OF 3 DOSES IN 15 MINUTES Patient taking differently: Place 0.4 mg  under the tongue every 5 (five) minutes as needed for chest pain. 08/06/16   Hilty, Lisette Abu, MD  REPATHA SURECLICK 140 MG/ML SOAJ Inject 140 mg into the skin every 14 (fourteen) days. 01/14/22   [provider]  tadalafil (CIALIS) 10 MG tablet Take 10 mg by mouth every morning. 07/11/21   [provider]  ticagrelor (BRILINTA) 90 MG TABS tablet Take 90 mg by mouth 2 (two) times daily.    [provider]  TRULICITY 1.5 MG/0.5ML SOPN Inject 1.5 mg into the skin once a week. 08/30/20   [provider]                                                                                                                                    Past Surgical History Past Surgical History:  Procedure Laterality Date   10/08/21 S/P Drug Eluting Stent OM1 at [facility]  10/08/2021   AV FISTULA PLACEMENT Left 07/23/2017   Procedure: ARTERIOVENOUS (AV) FISTULA CREATION LEFT ARM;  Surgeon: Nada Libman, MD;  Location: [facility] OR;  Service: Vascular;  Laterality: Left;   CORONARY ANGIOPLASTY  2010   LIMA to LAD, SVG to OM1,SVG to PDA   CORONARY ARTERY BYPASS GRAFT     EYE SURGERY     for Diabetic Retinopathy   FISTULA SUPERFICIALIZATION Left 08/27/2021   Procedure: PLICATION OF ANEURYSM LEFT ARTERIOVENOUS FISTULA;  Surgeon: Chuck Hint, MD;  Location: [facility] OR;  Service: Vascular;  Laterality: Left;   INSERTION OF DIALYSIS CATHETER N/A 07/07/2017   Procedure: INSERTION OF DIALYSIS CATHETER;  Surgeon: Fransisco Hertz, MD;  Location: [facility] OR;  Service: Vascular;  Laterality: N/A;   Family History Family History  Problem Relation Age of Onset   Hypertension Mother    Heart disease Father    Hypertension Father    Diabetes Father    Hyperlipidemia Father     Social History Social History   Tobacco Use   Smoking status: Former    Current packs/day: 0.00    Types: Cigarettes    Quit date: 09/17/2008    Years since quitting: 14.1   Smokeless tobacco: Never  Vaping Use   Vaping  status: Never Used  Substance Use Topics   Alcohol use: Not Currently   Drug use: No   Allergies Patient has no known allergies.  Review of Systems Review of Systems  Unable to perform ROS: Patient unresponsive    Physical Exam Vital Signs  I have reviewed the triage vital signs Ht 5' 6.5" (1.689 m)   Wt 108.9 kg   BMI 38.16 kg/m   Physical Exam Vitals and nursing note reviewed.  Constitutional:      General: He is in acute distress.     Appearance: He is well-developed. He is toxic-appearing and diaphoretic.  HENT:     Head: Normocephalic and atraumatic.  Cardiovascular:     Heart sounds: No murmur heard. Pulmonary:     Effort: Respiratory distress present.  Abdominal:     Palpations: Abdomen is soft.     Tenderness: There is no abdominal tenderness.  Musculoskeletal:        General: No swelling.     Cervical back: Neck supple.  Skin:    Capillary Refill: Capillary refill takes more than 3 seconds.     Coloration: Skin is pale.  Neurological:     Mental Status: He is alert.     ED Results and Treatments Labs (all labs ordered are listed, but only abnormal results are displayed) Labs Reviewed  I-STAT CHEM 8, ED - Abnormal; Notable for the following components:      Result Value   BUN 57 (*)    Creatinine, Ser 11.90 (*)    Glucose, Bld 254 (*)    All other components within normal limits  I-STAT VENOUS BLOOD GAS, ED - Abnormal; Notable for the following components:   pH, Ven 7.161 (*)    pCO2, Ven 77.0 (*)    Acid-base deficit 3.0 (*)    HCT 38.0 (*)    Hemoglobin 12.9 (*)    All other components within normal limits  I-STAT CG4 LACTIC ACID, ED - Abnormal; Notable for the following components:   Lactic Acid, Venous 7.6 (*)    All other components within normal limits                                                                                                                          Radiology No results found.  Pertinent labs & imaging results  that were available during my care of the patient were reviewed by me and considered in my medical decision making (see MDM for details).  Medications Ordered in ED Medications  lidocaine (cardiac) 100 mg/25mL (XYLOCAINE) injection 2% (100 mg Intravenous Given 11/28/2022 1310)  EPINEPHrine (ADRENALIN) 1 MG/10ML injection (1 mg Intravenous Given 12/11/2022 1311)  EPINEPHrine (ADRENALIN) 1 MG/10ML injection (1 mg Intravenous Given 12/06/2022 1314)  calcium chloride injection (1 g Intravenous Given 12/10/2022 1316)  Procedures .Critical Care  Performed by: Glendora Score, MD Authorized by: Glendora Score, MD   Critical care provider statement:    Critical care time (minutes):  30   Critical care was necessary to treat or prevent imminent or life-threatening deterioration of the following conditions:  Cardiac failure and shock   Critical care was time spent personally by me on the following activities:  Development of treatment plan with patient or surrogate, discussions with consultants, evaluation of patient's response to treatment, examination of patient, ordering and review of laboratory studies, ordering and review of radiographic studies, ordering and performing treatments and interventions, pulse oximetry, re-evaluation of patient's condition and review of old charts   (including critical care time)  Medical Decision Making / ED Course   This patient presents to the ED for concern of cardiac arrest, this involves an extensive number of treatment options, and is a complaint that carries with it a high risk of complications and morbidity.  The differential diagnosis includes MI, arrhythmia, PE, ICH, dissection  MDM: Patient seen emergency room for evaluation of cardiac arrest.  Physical exam reveals a toxic appearing patient with active CPR in place.  ET tube confirmed  by color change.  Multiple rounds of CPR performed including epinephrine administration, calcium administration and lidocaine as well as multiple shocks for persistent V-fib arrest.  I-STAT labs showing a BUN of 57, creatinine 11.9 consistent with his ESRD, pH 7.16 with a pCO2 of 77 and initial lactic acid of 7.6.  At that point, patient had had over 45 minutes of CPR with no return of spontaneous circulation.  Resuscitative efforts were terminated given no meaningful pathway for neurological recovery and patient pronounced dead at 1322.   Additional history obtained:  -External records from outside source obtained and reviewed including: Chart review including previous notes, labs, imaging, consultation notes   Lab Tests: -I ordered, reviewed, and interpreted labs.   The pertinent results include:   Labs Reviewed  I-STAT CHEM 8, ED - Abnormal; Notable for the following components:      Result Value   BUN 57 (*)    Creatinine, Ser 11.90 (*)    Glucose, Bld 254 (*)    All other components within normal limits  I-STAT VENOUS BLOOD GAS, ED - Abnormal; Notable for the following components:   pH, Ven 7.161 (*)    pCO2, Ven 77.0 (*)    Acid-base deficit 3.0 (*)    HCT 38.0 (*)    Hemoglobin 12.9 (*)    All other components within normal limits  I-STAT CG4 LACTIC ACID, ED - Abnormal; Notable for the following components:   Lactic Acid, Venous 7.6 (*)    All other components within normal limits       Medicines ordered and prescription drug management: Meds ordered this encounter  Medications   lidocaine (cardiac) 100 mg/21mL (XYLOCAINE) injection 2%   EPINEPHrine (ADRENALIN) 1 MG/10ML injection   EPINEPHrine (ADRENALIN) 1 MG/10ML injection   calcium chloride injection    -I have reviewed the patients home medicines and have made adjustments as needed  Critical interventions CPR, unsynchronized cardioversion    Cardiac Monitoring: The patient was maintained on a cardiac  monitor.  I personally viewed and interpreted the cardiac monitored which showed an underlying rhythm of: V-fib  Social Determinants of Health:  Factors impacting patients care include: none   Reevaluation: After the interventions noted above, I reevaluated the patient and found that they have :worsened  Co morbidities that complicate the  patient evaluation  Past Medical History:  Diagnosis Date   CAD (coronary artery disease)    CHF (congestive heart failure) (HCC)    Chronic kidney disease    Diabetes mellitus    Heart murmur    " slight"   Hyperlipidemia    Hypertension    Sleep apnea    wears CPAP      Dispostion: I considered admission for this patient, but unfortunately the patient expired in the emergency department     Final Clinical Impression(s) / ED Diagnoses Final diagnoses:  None     @PCDICTATION @    Glendora Score, MD 12/06/2022 1810

## 2022-12-14 NOTE — Code Documentation (Signed)
Asystole on monitor.

## 2022-12-14 DEATH — deceased
